# Patient Record
Sex: Female | Born: 1970 | Race: Black or African American | Hispanic: No | Marital: Single | Smoking: Former smoker
Health system: Southern US, Community
[De-identification: ages and names within clinical notes are randomized; demographics above are authoritative.]

## PROBLEM LIST (undated history)

## (undated) DIAGNOSIS — R011 Cardiac murmur, unspecified: Secondary | ICD-10-CM

## (undated) DIAGNOSIS — D649 Anemia, unspecified: Secondary | ICD-10-CM

## (undated) DIAGNOSIS — IMO0001 Reserved for inherently not codable concepts without codable children: Secondary | ICD-10-CM

## (undated) DIAGNOSIS — K649 Unspecified hemorrhoids: Secondary | ICD-10-CM

## (undated) DIAGNOSIS — K5900 Constipation, unspecified: Secondary | ICD-10-CM

## (undated) DIAGNOSIS — F32A Depression, unspecified: Secondary | ICD-10-CM

## (undated) DIAGNOSIS — N189 Chronic kidney disease, unspecified: Secondary | ICD-10-CM

## (undated) DIAGNOSIS — N281 Cyst of kidney, acquired: Secondary | ICD-10-CM

## (undated) DIAGNOSIS — F329 Major depressive disorder, single episode, unspecified: Secondary | ICD-10-CM

## (undated) DIAGNOSIS — N179 Acute kidney failure, unspecified: Secondary | ICD-10-CM

## (undated) DIAGNOSIS — K2970 Gastritis, unspecified, without bleeding: Secondary | ICD-10-CM

## (undated) DIAGNOSIS — I10 Essential (primary) hypertension: Secondary | ICD-10-CM

## (undated) DIAGNOSIS — E119 Type 2 diabetes mellitus without complications: Secondary | ICD-10-CM

## (undated) DIAGNOSIS — K219 Gastro-esophageal reflux disease without esophagitis: Secondary | ICD-10-CM

## (undated) HISTORY — PX: ABSCESS DRAINAGE: SHX1119

## (undated) HISTORY — DX: Gastritis, unspecified, without bleeding: K29.70

## (undated) HISTORY — PX: MULTIPLE TOOTH EXTRACTIONS: SHX2053

## (undated) HISTORY — DX: Acute kidney failure, unspecified: N17.9

## (undated) HISTORY — DX: Chronic kidney disease, unspecified: N18.9

## (undated) HISTORY — DX: Cyst of kidney, acquired: N28.1

## (undated) HISTORY — DX: Anemia, unspecified: D64.9

---
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
## 2014-09-08 ENCOUNTER — Inpatient Hospital Stay (HOSPITAL_COMMUNITY): Payer: Medicaid - Out of State

## 2014-09-08 ENCOUNTER — Encounter (HOSPITAL_COMMUNITY): Payer: Self-pay | Admitting: *Deleted

## 2014-09-08 ENCOUNTER — Inpatient Hospital Stay (HOSPITAL_COMMUNITY)
Admission: EM | Admit: 2014-09-08 | Discharge: 2014-09-10 | DRG: 392 | Disposition: A | Discharge status: Home / Self Care | Payer: Medicaid - Out of State | Attending: Internal Medicine | Admitting: Internal Medicine

## 2014-09-08 DIAGNOSIS — I150 Renovascular hypertension: Secondary | ICD-10-CM | POA: Diagnosis present

## 2014-09-08 DIAGNOSIS — N8320 Unspecified ovarian cysts: Secondary | ICD-10-CM | POA: Diagnosis present

## 2014-09-08 DIAGNOSIS — N179 Acute kidney failure, unspecified: Secondary | ICD-10-CM | POA: Diagnosis present

## 2014-09-08 DIAGNOSIS — N19 Unspecified kidney failure: Secondary | ICD-10-CM

## 2014-09-08 DIAGNOSIS — F17210 Nicotine dependence, cigarettes, uncomplicated: Secondary | ICD-10-CM | POA: Diagnosis present

## 2014-09-08 DIAGNOSIS — N189 Chronic kidney disease, unspecified: Secondary | ICD-10-CM | POA: Diagnosis present

## 2014-09-08 DIAGNOSIS — E1143 Type 2 diabetes mellitus with diabetic autonomic (poly)neuropathy: Secondary | ICD-10-CM | POA: Diagnosis present

## 2014-09-08 DIAGNOSIS — I129 Hypertensive chronic kidney disease with stage 1 through stage 4 chronic kidney disease, or unspecified chronic kidney disease: Secondary | ICD-10-CM | POA: Diagnosis present

## 2014-09-08 DIAGNOSIS — E11649 Type 2 diabetes mellitus with hypoglycemia without coma: Secondary | ICD-10-CM | POA: Diagnosis present

## 2014-09-08 DIAGNOSIS — R1110 Vomiting, unspecified: Secondary | ICD-10-CM

## 2014-09-08 DIAGNOSIS — D631 Anemia in chronic kidney disease: Secondary | ICD-10-CM | POA: Diagnosis present

## 2014-09-08 DIAGNOSIS — E1122 Type 2 diabetes mellitus with diabetic chronic kidney disease: Secondary | ICD-10-CM | POA: Diagnosis present

## 2014-09-08 DIAGNOSIS — R112 Nausea with vomiting, unspecified: Secondary | ICD-10-CM | POA: Diagnosis present

## 2014-09-08 DIAGNOSIS — K2970 Gastritis, unspecified, without bleeding: Principal | ICD-10-CM | POA: Diagnosis present

## 2014-09-08 DIAGNOSIS — K3184 Gastroparesis: Secondary | ICD-10-CM | POA: Diagnosis present

## 2014-09-08 DIAGNOSIS — R509 Fever, unspecified: Secondary | ICD-10-CM | POA: Diagnosis present

## 2014-09-08 DIAGNOSIS — N185 Chronic kidney disease, stage 5: Secondary | ICD-10-CM | POA: Diagnosis present

## 2014-09-08 DIAGNOSIS — Z794 Long term (current) use of insulin: Secondary | ICD-10-CM | POA: Diagnosis not present

## 2014-09-08 DIAGNOSIS — Z79899 Other long term (current) drug therapy: Secondary | ICD-10-CM | POA: Diagnosis not present

## 2014-09-08 DIAGNOSIS — I10 Essential (primary) hypertension: Secondary | ICD-10-CM

## 2014-09-08 DIAGNOSIS — D649 Anemia, unspecified: Secondary | ICD-10-CM | POA: Diagnosis present

## 2014-09-08 DIAGNOSIS — N83202 Unspecified ovarian cyst, left side: Secondary | ICD-10-CM

## 2014-09-08 HISTORY — DX: Type 2 diabetes mellitus without complications: E11.9

## 2014-09-08 HISTORY — DX: Essential (primary) hypertension: I10

## 2014-09-08 LAB — COMPREHENSIVE METABOLIC PANEL
ALT: 11 U/L — AB (ref 14–54)
AST: 16 U/L (ref 15–41)
Albumin: 4.1 g/dL (ref 3.5–5.0)
Alkaline Phosphatase: 86 U/L (ref 38–126)
Anion gap: 13 (ref 5–15)
BUN: 49 mg/dL — ABNORMAL HIGH (ref 6–20)
CHLORIDE: 109 mmol/L (ref 101–111)
CO2: 22 mmol/L (ref 22–32)
CREATININE: 4.51 mg/dL — AB (ref 0.44–1.00)
Calcium: 9.4 mg/dL (ref 8.9–10.3)
GFR, EST AFRICAN AMERICAN: 13 mL/min — AB (ref >60)
GFR, EST NON AFRICAN AMERICAN: 11 mL/min — AB (ref >60)
Glucose, Bld: 150 mg/dL — ABNORMAL HIGH (ref 65–99)
POTASSIUM: 3.9 mmol/L (ref 3.5–5.1)
Sodium: 144 mmol/L (ref 135–145)
TOTAL PROTEIN: 7.9 g/dL (ref 6.5–8.1)
Total Bilirubin: 0.8 mg/dL (ref 0.3–1.2)

## 2014-09-08 LAB — CBC
HCT: 30 % — ABNORMAL LOW (ref 36.0–46.0)
Hemoglobin: 9.3 g/dL — ABNORMAL LOW (ref 12.0–15.0)
MCH: 26.9 pg (ref 26.0–34.0)
MCHC: 31 g/dL (ref 30.0–36.0)
MCV: 86.7 fL (ref 78.0–100.0)
PLATELETS: 227 10*3/uL (ref 150–400)
RBC: 3.46 MIL/uL — AB (ref 3.87–5.11)
RDW: 15 % (ref 11.5–15.5)
WBC: 7.9 10*3/uL (ref 4.0–10.5)

## 2014-09-08 LAB — URINE MICROSCOPIC-ADD ON

## 2014-09-08 LAB — IRON AND TIBC
Iron: 64 ug/dL (ref 28–170)
Saturation Ratios: 17 % (ref 10.4–31.8)
TIBC: 368 ug/dL (ref 250–450)
UIBC: 304 ug/dL

## 2014-09-08 LAB — URINALYSIS, ROUTINE W REFLEX MICROSCOPIC
Bilirubin Urine: NEGATIVE
Glucose, UA: 250 mg/dL — AB
Hgb urine dipstick: NEGATIVE
KETONES UR: NEGATIVE mg/dL
LEUKOCYTES UA: NEGATIVE
NITRITE: NEGATIVE
PH: 6 (ref 5.0–8.0)
Specific Gravity, Urine: 1.013 (ref 1.005–1.030)
UROBILINOGEN UA: 0.2 mg/dL (ref 0.0–1.0)

## 2014-09-08 LAB — VITAMIN B12: Vitamin B-12: 434 pg/mL (ref 180–914)

## 2014-09-08 LAB — LIPASE, BLOOD: LIPASE: 13 U/L — AB (ref 22–51)

## 2014-09-08 LAB — GLUCOSE, CAPILLARY
Glucose-Capillary: 194 mg/dL — ABNORMAL HIGH (ref 65–99)
Glucose-Capillary: 128 mg/dL — ABNORMAL HIGH (ref 65–99)

## 2014-09-08 LAB — RETICULOCYTES
RBC.: 3.35 MIL/uL — AB (ref 3.87–5.11)
RETIC COUNT ABSOLUTE: 33.5 10*3/uL (ref 19.0–186.0)
RETIC CT PCT: 1 % (ref 0.4–3.1)

## 2014-09-08 LAB — FERRITIN: FERRITIN: 12 ng/mL (ref 11–307)

## 2014-09-08 LAB — CBG MONITORING, ED: Glucose-Capillary: 207 mg/dL — ABNORMAL HIGH (ref 65–99)

## 2014-09-08 LAB — POC URINE PREG, ED: Preg Test, Ur: NEGATIVE

## 2014-09-08 LAB — FOLATE: Folate: 13.5 ng/mL (ref >5.9)

## 2014-09-08 MED ORDER — SODIUM CHLORIDE 0.9 % IV SOLN
INTRAVENOUS | Status: DC
  Administered 2014-09-08 – 2014-09-10 (×3): via INTRAVENOUS

## 2014-09-08 MED ORDER — INSULIN GLARGINE 100 UNIT/ML ~~LOC~~ SOLN
5.0000 [IU] | Freq: Every day | SUBCUTANEOUS | Status: DC
  Administered 2014-09-08 – 2014-09-09 (×2): 5 [IU] via SUBCUTANEOUS
  Filled 2014-09-08 (×2): qty 0.05

## 2014-09-08 MED ORDER — HYDRALAZINE HCL 20 MG/ML IJ SOLN
10.0000 mg | INTRAMUSCULAR | Status: DC | PRN
  Administered 2014-09-09: 10 mg via INTRAVENOUS
  Filled 2014-09-08: qty 1

## 2014-09-08 MED ORDER — SODIUM CHLORIDE 0.9 % IV BOLUS (SEPSIS)
1000.0000 mL | Freq: Once | INTRAVENOUS | Status: AC
  Administered 2014-09-08: 1000 mL via INTRAVENOUS

## 2014-09-08 MED ORDER — ONDANSETRON HCL 4 MG PO TABS: 4.0000 mg | ORAL_TABLET | Freq: Four times a day (QID) | ORAL | Status: DC | PRN

## 2014-09-08 MED ORDER — PROMETHAZINE HCL 25 MG PO TABS: 12.5000 mg | ORAL_TABLET | Freq: Four times a day (QID) | ORAL | Status: DC | PRN

## 2014-09-08 MED ORDER — ONDANSETRON HCL 4 MG/2ML IJ SOLN
4.0000 mg | Freq: Once | INTRAMUSCULAR | Status: AC
  Administered 2014-09-08: 4 mg via INTRAVENOUS
  Filled 2014-09-08: qty 2

## 2014-09-08 MED ORDER — HEPARIN SODIUM (PORCINE) 5000 UNIT/ML IJ SOLN
5000.0000 [IU] | Freq: Three times a day (TID) | INTRAMUSCULAR | Status: DC
  Administered 2014-09-08 (×2): 5000 [IU] via SUBCUTANEOUS
  Filled 2014-09-08 (×9): qty 1

## 2014-09-08 MED ORDER — PROMETHAZINE HCL 25 MG/ML IJ SOLN
12.5000 mg | Freq: Once | INTRAMUSCULAR | Status: AC
  Administered 2014-09-08: 12.5 mg via INTRAVENOUS
  Filled 2014-09-08: qty 1

## 2014-09-08 MED ORDER — INSULIN ASPART 100 UNIT/ML ~~LOC~~ SOLN
0.0000 [IU] | Freq: Three times a day (TID) | SUBCUTANEOUS | Status: DC
  Administered 2014-09-08: 2 [IU] via SUBCUTANEOUS
  Administered 2014-09-09: 1 [IU] via SUBCUTANEOUS
  Administered 2014-09-09: 3 [IU] via SUBCUTANEOUS
  Administered 2014-09-10: 7 [IU] via SUBCUTANEOUS

## 2014-09-08 MED ORDER — MORPHINE SULFATE (PF) 4 MG/ML IV SOLN: 4.0000 mg | Freq: Once | INTRAVENOUS | Status: DC

## 2014-09-08 MED ORDER — PROMETHAZINE HCL 25 MG RE SUPP
25.0000 mg | Freq: Four times a day (QID) | RECTAL | Status: DC | PRN
  Filled 2014-09-08: qty 1

## 2014-09-08 MED ORDER — HYDRALAZINE HCL 50 MG PO TABS
100.0000 mg | ORAL_TABLET | Freq: Two times a day (BID) | ORAL | Status: DC
Start: 2014-09-08 — End: 2014-09-10
  Administered 2014-09-09 – 2014-09-10 (×3): 100 mg via ORAL
  Filled 2014-09-08 (×5): qty 2

## 2014-09-08 MED ORDER — ACETAMINOPHEN 650 MG RE SUPP
650.0000 mg | RECTAL | Status: DC | PRN
  Administered 2014-09-08: 650 mg via RECTAL
  Filled 2014-09-08: qty 1

## 2014-09-08 MED ORDER — METOCLOPRAMIDE HCL 5 MG/ML IJ SOLN
5.0000 mg | Freq: Three times a day (TID) | INTRAMUSCULAR | Status: DC
  Administered 2014-09-08 (×2): 5 mg via INTRAVENOUS
  Filled 2014-09-08 (×6): qty 1
  Filled 2014-09-08 (×3): qty 2

## 2014-09-08 MED ORDER — PROMETHAZINE HCL 25 MG/ML IJ SOLN
6.2500 mg | Freq: Four times a day (QID) | INTRAMUSCULAR | Status: DC | PRN
  Administered 2014-09-08: 6.25 mg via INTRAVENOUS
  Filled 2014-09-08 (×2): qty 1

## 2014-09-08 MED ORDER — DILTIAZEM HCL ER 240 MG PO CP24
240.0000 mg | ORAL_CAPSULE | Freq: Every day | ORAL | Status: DC
  Administered 2014-09-09 – 2014-09-10 (×2): 240 mg via ORAL
  Filled 2014-09-08 (×3): qty 1

## 2014-09-08 MED ORDER — ONDANSETRON HCL 4 MG/2ML IJ SOLN
4.0000 mg | Freq: Once | INTRAMUSCULAR | Status: AC | PRN
  Administered 2014-09-08: 4 mg via INTRAVENOUS
  Filled 2014-09-08: qty 2

## 2014-09-08 MED ORDER — ONDANSETRON HCL 4 MG/2ML IJ SOLN
4.0000 mg | Freq: Four times a day (QID) | INTRAMUSCULAR | Status: DC | PRN
  Administered 2014-09-08: 4 mg via INTRAVENOUS
  Filled 2014-09-08: qty 2

## 2014-09-08 MED ORDER — CLONIDINE HCL 0.3 MG PO TABS
0.3000 mg | ORAL_TABLET | Freq: Two times a day (BID) | ORAL | Status: DC
  Administered 2014-09-09 – 2014-09-10 (×3): 0.3 mg via ORAL
  Filled 2014-09-08 (×5): qty 1

## 2014-09-08 MED ORDER — VITAMIN D (ERGOCALCIFEROL) 1.25 MG (50000 UNIT) PO CAPS
50000.0000 [IU] | ORAL_CAPSULE | ORAL | Status: DC
  Administered 2014-09-08: 50000 [IU] via ORAL
  Filled 2014-09-08: qty 1

## 2014-09-08 MED ORDER — SODIUM CHLORIDE 0.9 % IV SOLN: INTRAVENOUS | Status: DC

## 2014-09-08 NOTE — ED Notes (Signed)
Bed: ZO10 Expected date:  Expected time:  Means of arrival:  Comments: EMS- 44yo F, emesis x 10 years

## 2014-09-08 NOTE — ED Notes (Addendum)
To bedside patient continues to gag and spit up. Father requesting something different for nausea for patient. Patient nonverbal. Offers response to questions only after asking multiple times.  Patient has pulled IV out. Bleeding controlled, dressing applied.  Pt remains unable to provide UA. Would not like me to restart her IV at this time.   Will continue to monitor.

## 2014-09-08 NOTE — ED Notes (Signed)
Pt still unable to void at this time 

## 2014-09-08 NOTE — ED Notes (Signed)
Pt in restroom and has been instructed to obtain urine specimen

## 2014-09-08 NOTE — ED Notes (Signed)
Pt had an episode of vomiting will f/u with EDP

## 2014-09-08 NOTE — Progress Notes (Addendum)
CM spoke with pt who confirms uninsured [employer] resident with no pcp.  CM discussed and provided written information for uninsured accepting pcps, discussed the importance of pcp vs EDP services for f/u care, www.needymeds.org, www.goodrx.com, discounted pharmacies and other [employer] such as [facility] , [facility], affordable care act, financial assistance, uninsured dental services, [place] med assist, DSS and  health department  Reviewed resources for [employer] uninsured accepting pcps like [facility], family medicine at [facility], [facility], [facility], local urgent care centers, [facility], [facility] family practice, general medical clinics, family services of the [place], [facility] urgent care plus others, medication resources, [facility] out patient pharmacies and housing Pt voiced understanding and appreciation of resources provided   Provided [facility] contact information Pt agreed to a referral Cm completed referral Pt to be contact by [facility] clinical [employer] given to female at bedside in a pt belonging bag

## 2014-09-08 NOTE — H&P (Signed)
History and Physical    Tarini Carrier AVW:[id number] DOB: 1970/12/07 DOA: 09/08/2014  Referring physician: Dr. Adriana Simas PCP: No primary care provider on file.  Specialists: none  Chief Complaint: nausea  HPI: Crystal Tanner is a 44 y.o. female has a past medical history significant for chronic kidney disease, insulin-dependent diabetes mellitus, history of recurrent intractable nausea and vomiting, who recently relocated to [place] area from [place], presents to the emergency room with a chief complaint of severe nausea for the past 2-3 hours. She is not sure of why she has nausea, she gets 3 episodes per year which usually go away with IV medications. She is not very forthcoming when it comes to prior medical history. She denies any chest pain, denies any shortness of breath, she denies any abdominal pain, she denies any diarrhea or constipation. She has no fever or chills. She denies any dysuria or any problems urinating. In the emergency room, she was found to hypertensive in the 180s to 200s systolic, her nausea has been difficult to control, her blood work showed a BUN of 49 and a creatinine of 4.5 as well as mild anemia with a hemoglobin of 9.3. Patient endorses a history of renal disease and seen a nephrologist, however she denies prior discussions about hemodialysis (she was told that eventually she will need it). She doesn't know her baseline kidney function.  [facility] was asked to admit for intractable nausea and vomiting as well as renal failure of undetermined chronicity  Review of Systems: As per history of present illness, otherwise 10 point ROSnegative  Past Medical History  Diagnosis Date  . Diabetes mellitus without complication   . Hypertension    History reviewed. No pertinent past surgical history.   Social History:  reports that she has been smoking.  She does not have any smokeless tobacco history on file. She reports that she does not drink alcohol or use illicit drugs.  No  Known Allergies  Denies existing or history of medical problems in her family.  Prior to Admission medications   Medication Sig Start Date End Date Taking? Authorizing Provider  cloNIDine (CATAPRES) 0.3 MG tablet Take 0.3 mg by mouth 2 (two) times daily.   Yes Historical Provider, MD  diltiazem (DILACOR XR) 240 MG 24 hr capsule Take 240 mg by mouth daily.   Yes Historical Provider, MD  furosemide (LASIX) 40 MG tablet Take 40 mg by mouth daily as needed for fluid or edema.   Yes Historical Provider, MD  hydrALAZINE (APRESOLINE) 100 MG tablet Take 100 mg by mouth 2 (two) times daily.   Yes Historical Provider, MD  insulin aspart (NOVOLOG FLEXPEN) 100 UNIT/ML FlexPen Inject 1-10 Units into the skin 3 (three) times daily with meals.   Yes Historical Provider, MD  insulin glargine (LANTUS) 100 UNIT/ML injection Inject 5 Units into the skin at bedtime.   Yes Historical Provider, MD  Vitamin D, Ergocalciferol, (DRISDOL) 50000 UNITS CAPS capsule Take 50,000 Units by mouth every 7 (seven) days.   Yes Historical Provider, MD   Physical Exam: Filed Vitals:   09/08/14 0746 09/08/14 1001 09/08/14 1049 09/08/14 1148  BP: 207/85 174/55 188/79 186/65  Pulse:  86 93 90  Resp:  SpO2:  98% 94% 98%     GENERAL: in distress, dry heaving   HEENT: head NCAT, no scleral icterus. Pupils round and reactive. Mucous membranes are moist. Posterior pharynx clear of any exudate or lesions.  NECK: Supple.  LUNGS: Clear to  auscultation. No wheezing or crackles  HEART: Regular rate and rhythm without murmur. 2+ pulses, no JVD, trace peripheral edema  ABDOMEN: Soft, nontender, and nondistended. Positive bowel sounds.   EXTREMITIES: Without any cyanosis, clubbing, rash  NEUROLOGIC: Alert and oriented x3. Non focal  SKIN: No ulceration or induration present.   Labs on Admission:  Basic Metabolic Panel:  Recent Labs Lab 09/08/14 0810  NA 144  K 3.9  CL 109  CO2 22  GLUCOSE 150*  BUN 49*    CREATININE 4.51*  CALCIUM 9.4   Liver Function Tests:  Recent Labs Lab 09/08/14 0810  AST 16  ALT 11*  ALKPHOS 86  BILITOT 0.8  PROT 7.9  ALBUMIN 4.1    Recent Labs Lab 09/08/14 0810  LIPASE 13*   CBC:  Recent Labs Lab 09/08/14 0810  WBC 7.9  HGB 9.3*  HCT 30.0*  MCV 86.7  PLT 227   EKG: pending  Assessment/Plan Active Problems:   Gastritis   Intractable nausea and vomiting   CKD (chronic kidney disease)   AKI (acute kidney injury)   Diabetes mellitus   HTN (hypertension)   Anemia   Intractable nausea and vomiting  - Based on history, as well as the presence of diabetes, suspect that she may have underlying gastroparesis  - When asked about what worse in the past, she mentions Reglan  - We'll start Zofran, Phenergan for refractory nausea as well as scheduled Reglan  - Nothing by mouth except medications, advance diet as tolerated  - Provide IV fluids meanwhile  - missed her period in August, obtain pregnancy testing  Acute on chronic renal failure - Likely due to underlying diabetes, I'm not sure about her baseline renal function - Provide IV fluids, monitor creatinine in the morning - Obtain renal ultrasound - If she is really stage 4-5, we will need to see nephrology soon [facility] consult care management for help with establishing a PCP in the area - consent form obtained to release of information, will fax as soon as she gets to the floor.  DM - We'll obtain a hemoglobin A1c, she is well controlled per patient - Resume her home Lantus as well as sliding scale  Hypertension - Suspect this has been difficult to control as an outpatient, she is on clonidine, diltiazem, Lasix and hydralazine. - Resume her home regimen, IV hydralazine as needed  Anemia - Likely in the setting of CKD, obtain anemia panel   Diet: Nothing by mouth except medications  Fluids: normal saline  DVT Prophylaxis: heparin subcutaneous  Code Status: full code  Family  Communication: d/w father bedside Disposition Plan: admit to [facility]. Elvera Lennox, MD [facility] Pager [phone]  If 7PM-7AM, please contact night-coverage [email or web address] Password [email or web address] 09/08/2014, 1:15 PM

## 2014-09-08 NOTE — Progress Notes (Signed)
Temp of 101.3 paged to MD on call.  CXR was ordered, but pt refused stating "I've been here 10 times for the same thing and it has nothing to do with my chest."  Md notified of refusal. Pt did take tylenol suppository and allowed blood cx x 2.  She is resting now. Will continue to monitor pt.

## 2014-09-08 NOTE — ED Notes (Signed)
Unsuccessful Iv attempt x 2, second RN to attempt 

## 2014-09-08 NOTE — ED Notes (Signed)
Still unable to provide urine specimen.

## 2014-09-08 NOTE — ED Notes (Signed)
Pt stated that she is unable to urinate at this time.

## 2014-09-08 NOTE — ED Notes (Signed)
2nd call placed for report

## 2014-09-08 NOTE — ED Provider Notes (Signed)
CSN: [id number]     Arrival date & time 09/08/14  1191 History   First MD Initiated Contact with Patient 09/08/14 [phone]     Chief Complaint  Patient presents with  . Emesis     (Consider location/radiation/quality/duration/timing/severity/associated sxs/prior Treatment) HPI....... patient complains of intractable vomiting for several hours prior to admission.   This has happened multiple times in the past. She has diabetes and hypertension. She recently moved here from [facility]. She does not have a primary care relationship. Vague history of renal disease, but no specifics from patient or her father. No fever, sweats, chills. Decreased urinary output   Past Medical History  Diagnosis Date  . Diabetes mellitus without complication   . Hypertension    History reviewed. No pertinent past surgical history. No family history on file. Social History  Substance Use Topics  . Smoking status: Current Every Day Smoker  . Smokeless tobacco: None  . Alcohol Use: No     Comment: less than a ppd, "not much"   OB History    No data available     Review of Systems  All other systems reviewed and are negative.     Allergies  Review of patient's allergies indicates no known allergies.  Home Medications   Prior to Admission medications   Medication Sig Start Date End Date Taking? Authorizing Provider  cloNIDine (CATAPRES) 0.3 MG tablet Take 0.3 mg by mouth 2 (two) times daily.   Yes Historical Provider, MD  diltiazem (DILACOR XR) 240 MG 24 hr capsule Take 240 mg by mouth daily.   Yes Historical Provider, MD  furosemide (LASIX) 40 MG tablet Take 40 mg by mouth daily as needed for fluid or edema.   Yes Historical Provider, MD  hydrALAZINE (APRESOLINE) 100 MG tablet Take 100 mg by mouth 2 (two) times daily.   Yes Historical Provider, MD  insulin aspart (NOVOLOG FLEXPEN) 100 UNIT/ML FlexPen Inject 1-10 Units into the skin 3 (three) times daily with meals.   Yes Historical Provider, MD   insulin glargine (LANTUS) 100 UNIT/ML injection Inject 5 Units into the skin at bedtime.   Yes Historical Provider, MD  Vitamin D, Ergocalciferol, (DRISDOL) 50000 UNITS CAPS capsule Take 50,000 Units by mouth every 7 (seven) days.   Yes Historical Provider, MD   BP 186/65 mmHg  Pulse 90  Temp(Src)   Resp 18  SpO2 98% Physical Exam  Constitutional: She is oriented to person, place, and time. She appears well-developed and well-nourished.  HENT:  Head: Normocephalic and atraumatic.  Eyes: Conjunctivae and EOM are normal. Pupils are equal, round, and reactive to light.  Neck: Normal range of motion. Neck supple.  Cardiovascular: Normal rate and regular rhythm.   Pulmonary/Chest: Effort normal and breath sounds normal.  Abdominal: Soft. Bowel sounds are normal.  Minimal epigastric tenderness  Musculoskeletal: Normal range of motion.  Neurological: She is alert and oriented to person, place, and time.  Skin: Skin is warm and dry.  Psychiatric: She has a normal mood and affect. Her behavior is normal.  Nursing note and vitals reviewed.   ED Course  Procedures (including critical care time) Labs Review Labs Reviewed  LIPASE, BLOOD - Abnormal; Notable for the following:    Lipase 13 (*)    All other components within normal limits  COMPREHENSIVE METABOLIC PANEL - Abnormal; Notable for the following:    Glucose, Bld 150 (*)    BUN 49 (*)    Creatinine, Ser 4.51 (*)  ALT 11 (*)    GFR calc non Af Amer 11 (*)    GFR calc Af Amer 13 (*)    All other components within normal limits  CBC - Abnormal; Notable for the following:    RBC 3.46 (*)    Hemoglobin 9.3 (*)    HCT 30.0 (*)    All other components within normal limits  URINALYSIS, ROUTINE W REFLEX MICROSCOPIC (NOT AT [facility])    Imaging Review No results found. I have personally reviewed and evaluated these images and lab results as part of my medical decision-making.   EKG Interpretation None      MDM   Final  diagnoses:  Gastritis  Renal failure    I suspect patient has gastroparesis secondary to diabetes.  IV hydration, IV Zofran. Creatinine noted to be elevated. Will need nephrology consult. Admit to general medicine    Donnetta Hutching, MD 09/08/14 [phone]

## 2014-09-08 NOTE — ED Notes (Signed)
Pt reminded of urine specimen. Pt nodded in acknowledgement.

## 2014-09-08 NOTE — ED Notes (Signed)
Vomiting x 10 years. This episode has been going on x 3 hours, throwing up mostly "phlegm" per EMS.   Hx htn, DM, ?cyst on kidney  190/82 HR 70 RR 18 CBG 127 97% on RA

## 2014-09-08 NOTE — ED Notes (Signed)
Pt in restroom attempting to provide urine specimen.

## 2014-09-08 NOTE — ED Notes (Signed)
Call placed for report, RN not available at this time. Will f/u

## 2014-09-08 NOTE — ED Notes (Signed)
Pt reminded of need for UA pt just stares at me when i ask her about it. After asking several times she states she is unable to void at this time.

## 2014-09-09 ENCOUNTER — Encounter (HOSPITAL_COMMUNITY): Payer: Self-pay | Admitting: Radiology

## 2014-09-09 ENCOUNTER — Inpatient Hospital Stay (HOSPITAL_COMMUNITY): Payer: Medicaid - Out of State

## 2014-09-09 DIAGNOSIS — R509 Fever, unspecified: Secondary | ICD-10-CM

## 2014-09-09 DIAGNOSIS — N8320 Unspecified ovarian cysts: Secondary | ICD-10-CM

## 2014-09-09 LAB — CBC
HEMATOCRIT: 31.6 % — AB (ref 36.0–46.0)
HEMOGLOBIN: 9.7 g/dL — AB (ref 12.0–15.0)
MCH: 26.8 pg (ref 26.0–34.0)
MCHC: 30.7 g/dL (ref 30.0–36.0)
MCV: 87.3 fL (ref 78.0–100.0)
Platelets: 260 10*3/uL (ref 150–400)
RBC: 3.62 MIL/uL — ABNORMAL LOW (ref 3.87–5.11)
RDW: 14.9 % (ref 11.5–15.5)
WBC: 8.4 10*3/uL (ref 4.0–10.5)

## 2014-09-09 LAB — BASIC METABOLIC PANEL
Anion gap: 13 (ref 5–15)
BUN: 46 mg/dL — AB (ref 6–20)
CHLORIDE: 113 mmol/L — AB (ref 101–111)
CO2: 22 mmol/L (ref 22–32)
Calcium: 9.4 mg/dL (ref 8.9–10.3)
Creatinine, Ser: 4.35 mg/dL — ABNORMAL HIGH (ref 0.44–1.00)
GFR calc Af Amer: 13 mL/min — ABNORMAL LOW (ref >60)
GFR calc non Af Amer: 11 mL/min — ABNORMAL LOW (ref >60)
GLUCOSE: 45 mg/dL — AB (ref 65–99)
POTASSIUM: 3.6 mmol/L (ref 3.5–5.1)
Sodium: 148 mmol/L — ABNORMAL HIGH (ref 135–145)

## 2014-09-09 LAB — HEMOGLOBIN A1C
Hgb A1c MFr Bld: 7.3 % — ABNORMAL HIGH (ref 4.8–5.6)
Mean Plasma Glucose: 163 mg/dL

## 2014-09-09 LAB — GLUCOSE, CAPILLARY
GLUCOSE-CAPILLARY: 62 mg/dL — AB (ref 65–99)
GLUCOSE-CAPILLARY: 280 mg/dL — AB (ref 65–99)
Glucose-Capillary: 78 mg/dL (ref 65–99)
Glucose-Capillary: 129 mg/dL — ABNORMAL HIGH (ref 65–99)
Glucose-Capillary: 223 mg/dL — ABNORMAL HIGH (ref 65–99)

## 2014-09-09 MED ORDER — LIP MEDEX EX OINT
TOPICAL_OINTMENT | CUTANEOUS | Status: AC
  Administered 2014-09-09: 13:00:00
  Filled 2014-09-09: qty 7

## 2014-09-09 MED ORDER — IOHEXOL 300 MG/ML  SOLN
25.0000 mL | INTRAMUSCULAR | Status: AC
  Administered 2014-09-09 (×2): 25 mL via ORAL

## 2014-09-09 NOTE — Progress Notes (Signed)
Hypoglycemic Event  CBG: 62  Treatment: 15 GM carbohydrate snack  Symptoms: None  Follow-up CBG: Time:0753 CBG Result:78  Possible Reasons for Event: Inadequate meal intake  Comments/MD notified:Gherghe: Clear Liquid diet ordered    Sharice Harriss Dawn  Remember to initiate Hypoglycemia Order Set & complete

## 2014-09-09 NOTE — Progress Notes (Addendum)
Initial Nutrition Assessment  DOCUMENTATION CODES:   Not applicable  INTERVENTION:  - Continue diet order: carb modified diet. - RD will continue to monitor patient needs.    NUTRITION DIAGNOSIS:   Inadequate oral intake related to vomiting, nausea as evidenced by per patient/family report.   GOAL:   Patient will meet greater than or equal to 90% of their needs   MONITOR:   PO intake, Weight trends, Labs, Skin  REASON FOR ASSESSMENT:   Malnutrition Screening Tool    ASSESSMENT:   44 yr old female, presenting with gastritis and emesis. Previous history of DM, HTN, and Renal Disease.   - Patient assessed for MST score. Patient alert in room, guest was present during assessment. Patient reported heigh and weight, and BMI was calculated, 25.85 (overweight).   - Normal diet for patient to include lighter meals throughout day, such as: cereal, sandwiches, and coffee. Patient described eating a main meal in the evening.  Patient states that she only drinks coffee periodically throughout the day, no other beverages reported.  No supplements reported.   - No weight or height in medical record, patient reported values. Patient discussed previous weight loss of 100 #, 17 years ago. Weight has been stable since weight loss.   - Patient has a history of gastritis-related episodes, with n=10 occurences previously. During episodes, she has nausea/vomitting for 24 hours, then symptoms subside. Patient states that during these episodes, there is usually no pain associated.   - Medications reviewed: Reglan order in place.   - Labs reviewed: high Na (148), high BUN (46), high Cr (4.35), low EGFR (13), CBG (62-207).   Diet Order:  Diet Carb Modified Fluid consistency:: Thin; Room service appropriate?: Yes  Skin:  Reviewed, no issues  Last BM:  PTA  Height:   Ht Readings from Last 1 Encounters:  09/09/14 5' 8"  (1.727 m)    Weight:   Wt Readings from Last 1 Encounters:   09/09/14 170 lb (77.111 kg)    Ideal Body Weight:  63.6 kg  BMI:  Body mass index is 25.85 kg/(m^2).  Estimated Nutritional Needs:   Kcal:  1600-1800 kcal  Protein:  65-75 g  Fluid:  >/= 1.7 L/day  EDUCATION NEEDS:   No education needs identified at this time  Kayleen Memos, Dietetic Intern

## 2014-09-09 NOTE — Progress Notes (Signed)
MD paged to view abnormal Abd CT scan results.

## 2014-09-09 NOTE — Progress Notes (Signed)
PROGRESS NOTE  Crystal Tanner ZOX:[id number] DOB: [birth year] DOA: 09/08/2014 PCP: No primary care provider on file.  HPI: 44 y.o. female has a past medical history significant for chronic kidney disease, insulin-dependent diabetes mellitus, history of recurrent intractable nausea and vomiting, who recently relocated to [place] area from [place], presents to the emergency room with a chief complaint of severe nausea for the past 2-3 hours  Subjective / 24 H Interval events - improved this morning, nausea improved, asking to eat  Assessment/Plan: Active Problems:   Gastritis   Intractable nausea and vomiting   CKD (chronic kidney disease)   AKI (acute kidney injury)   Diabetes mellitus   HTN (hypertension)   Anemia  Intractable nausea and vomiting  - improving with conservative measures, stable today   Acute on chronic renal failure - Likely due to underlying diabetes, I'm not sure about her baseline renal function but suspect CKD stage IV-V - renal US without acute findings but medical renal disease  Fever - febrile last night, given abdominal complaints obtained a CT scan which showed Fluid attenuation structures in the LEFT adnexa, largest 3.6 x 3.0 cm, differential diagnosis including non-opacified small bowel loops, ovarian cysts, and hydrosalpinx/tubo-ovarian abscess.  - obtain US to better characterize  DM - AiC 7.3 - Resumed her home Lantus as well as sliding scale - hypoglycemic episode this morning, decrease Lantus  Hypertension - Suspect this has been difficult to control as an outpatient, she is on clonidine, diltiazem, Lasix and hydralazine. - Resume her home regimen, IV hydralazine as needed - blood pressure with fair control, no changes   Anemia - stable, no iron deficiency noted   Diet: Diet Carb Modified Fluid consistency:: Thin; Room service appropriate?: Yes Fluids: none  DVT Prophylaxis: heparin  Code Status: Full Code Family Communication: d/w  father bedside  Disposition Plan: home when ready   Consultants:  None   Procedures:  None    Antibiotics  Anti-infectives    None       Studies  Ct Abdomen Pelvis Wo Contrast  09/09/2014   ADDENDUM REPORT: 09/09/2014 13:58  ADDENDUM: These results will be called to the ordering clinician or representative by the Radiologist Assistant, and communication documented in the PACS or zVision Dashboard.   Electronically Signed   By: Ulyses Southward M.D.   On: 09/09/2014 13:58   09/09/2014   CLINICAL DATA:  Chronic kidney disease, diabetes mellitus, hypertension, intractable nausea and vomiting, gastritis  EXAM: CT ABDOMEN AND PELVIS WITHOUT CONTRAST  TECHNIQUE: Multidetector CT imaging of the abdomen and pelvis was performed following the standard protocol without IV contrast. Sagittal and coronal MPR images reconstructed from axial data set. Oral contrast was administered.  COMPARISON:  None  FINDINGS: Minimal atelectasis or ground-glass infiltrate in RIGHT lower lobe.  Heart appears enlarged.  Low-attenuation of circulating blood question anemia.  Extensive atherosclerotic calcifications.  Cyst at inferior pole LEFT kidney 3.0 x 2.6 cm.  Within limits of a nonenhanced exam, liver, spleen, pancreas, kidneys, and adrenal glands otherwise grossly normal appearance.  Distended gallbladder with question dependent density/tiny gallstones.  Normal appearing bladder and ureters.  Distal small bowel loops and colon are opacified by GI contrast, grossly unremarkable.  Suboptimal assessment of proximal small bowel loops and remainder of colon.  Stomach decompressed, wall appearing thickened though this could be an artifact from underdistention.  Fluid collections in the LEFT adnexa, largest 3.6 x 3.0 cm image 68.  While these could potentially be related to unopacified  proximal small bowel loops, none of the remaining small bowel loops demonstrate fluid attenuation contents.  Ovarian cysts and  hydrosalpinx/tubo-ovarian abscess not excluded.  Unremarkable uterus and RIGHT adnexa.  No adenopathy, free air, or ascites.  Scattered infiltration of soft tissue planes suggesting hypoproteinemia.  No acute osseous findings.  IMPRESSION: Fluid attenuation structures in the LEFT adnexa, largest 3.6 x 3.0 cm, differential diagnosis including non-opacified small bowel loops, ovarian cysts, and hydrosalpinx/tubo-ovarian abscess.  Correlation with sonography recommended.  Extensive atherosclerotic disease.  Question gallstones.  Small LEFT renal cyst.  Suspect anemia.  Question gastric wall thickening versus artifact from underdistention ; recommend correlation with a cyst history and consider upper GI exam or upper endoscopy for further evaluation.  Electronically Signed: By: Ulyses Southward M.D. On: 09/09/2014 13:49   US Renal  09/08/2014   CLINICAL DATA:  Renal failure.  Hypertension and diabetes.  EXAM: RENAL / URINARY TRACT ULTRASOUND COMPLETE  COMPARISON:  None.  FINDINGS: Right Kidney:  Length: 11.9 cm. No mass or hydronephrosis. Cortical echogenicity is increased.  Left Kidney:  Length: 11.7 cm. No hydronephrosis. Cortical echogenicity is increased. A cyst off the lower pole measures 3.2 x 2.6 x 2.6 cm. There appears to be a small amount of debris within the cyst.  Bladder:  Appears normal for degree of bladder distention.  IMPRESSION: Increased cortical echogenicity in both kidneys consistent with medical renal disease.   Electronically Signed   By: Drusilla Kanner M.D.   On: 09/08/2014 17:28    Objective  Filed Vitals:   09/09/14 0950 09/09/14 1116 09/09/14 1400 09/09/14 1432  BP: 207/62 116/41 139/60   Pulse:  67 55   Temp: 97.2 F (36.2 C)  100.3 F (37.9 C)   TempSrc: Axillary  Oral   Resp:      Height:    5\' 8"  (1.727 m)  Weight:    77.111 kg (170 lb)  SpO2:   99%     Intake/Output Summary (Last 24 hours) at 09/09/14 1500 Last data filed at 09/09/14 0856  Gross per 24 hour  Intake  1531.25 ml  Output      0 ml  Net 1531.25 ml   Filed Weights   09/09/14 1432  Weight: 77.111 kg (170 lb)    Exam:  GENERAL: NAD  HEENT: head NCAT, no scleral icterus.   NECK: Supple. No carotid bruits. No lymphadenopathy or thyromegaly.  LUNGS: Clear to auscultation. No wheezing or crackles  HEART: Regular rate and rhythm without murmur. 2+ pulses, no JVD, no peripheral edema  ABDOMEN: Soft, nontender, and nondistended. Positive bowel sounds. No hepatosplenomegaly was noted.  EXTREMITIES: Without any cyanosis, clubbing, rash, lesions or edema.   Data Reviewed: Basic Metabolic Panel:  Recent Labs Lab 09/08/14 0810 09/09/14 0540  NA 144 148*  K 3.9 3.6  CL 109 113*  CO2 22 22  GLUCOSE 150* 45*  BUN 49* 46*  CREATININE 4.51* 4.35*  CALCIUM 9.4 9.4   Liver Function Tests:  Recent Labs Lab 09/08/14 0810  AST 16  ALT 11*  ALKPHOS 86  BILITOT 0.8  PROT 7.9  ALBUMIN 4.1    Recent Labs Lab 09/08/14 0810  LIPASE 13*   CBC:  Recent Labs Lab 09/08/14 0810 09/09/14 0540  WBC 7.9 8.4  HGB 9.3* 9.7*  HCT 30.0* 31.6*  MCV 86.7 87.3  PLT 227 260   CBG:  Recent Labs Lab 09/08/14 1810 09/08/14 2039 09/09/14 0727 09/09/14 0752 09/09/14 1217  GLUCAP 194* 128* 62*  78 129*    Recent Results (from the past 240 hour(s))  Culture, blood (routine x 2)     Status: None (Preliminary result)   Collection Time: 09/08/14  9:35 PM  Result Value Ref Range Status   Specimen Description BLOOD RIGHT ANTECUBITAL  Final   Special Requests BOTTLES DRAWN AEROBIC ONLY 5CC  Final   Culture   Final    NO GROWTH < 24 HOURS Performed at [facility]    Report Status PENDING  Incomplete  Culture, blood (routine x 2)     Status: None (Preliminary result)   Collection Time: 09/08/14  9:40 PM  Result Value Ref Range Status   Specimen Description BLOOD RIGHT HAND  Final   Special Requests BOTTLES DRAWN AEROBIC ONLY 5CC  Final   Culture   Final    NO GROWTH <  24 HOURS Performed at [facility]    Report Status PENDING  Incomplete     Scheduled Meds: . cloNIDine  0.3 mg Oral BID  . diltiazem  240 mg Oral Daily  . heparin  5,000 Units Subcutaneous 3 times per day  . hydrALAZINE  100 mg Oral BID  . insulin aspart  0-9 Units Subcutaneous TID WC  . insulin glargine  5 Units Subcutaneous QHS  . metoCLOPramide (REGLAN) injection  5 mg Intravenous 3 times per day  .  morphine injection  4 mg Intravenous Once  . Vitamin D (Ergocalciferol)  50,000 Units Oral Q7 days   Continuous Infusions: . sodium chloride 75 mL/hr at 09/08/14 1319     Pamella Pert, MD [facility] Pager [phone]. If 7 PM - 7 AM, please contact night-coverage at [email or web address], password [facility] 09/09/2014, 3:00 PM  LOS: 1 day

## 2014-09-10 DIAGNOSIS — N184 Chronic kidney disease, stage 4 (severe): Secondary | ICD-10-CM

## 2014-09-10 DIAGNOSIS — K2970 Gastritis, unspecified, without bleeding: Principal | ICD-10-CM

## 2014-09-10 LAB — GLUCOSE, CAPILLARY: Glucose-Capillary: 329 mg/dL — ABNORMAL HIGH (ref 65–99)

## 2014-09-10 MED ORDER — CLONIDINE HCL 0.3 MG PO TABS: 0.3000 mg | ORAL_TABLET | Freq: Two times a day (BID) | ORAL | Status: DC

## 2014-09-10 MED ORDER — HYDRALAZINE HCL 100 MG PO TABS: 100.0000 mg | ORAL_TABLET | Freq: Three times a day (TID) | ORAL | Status: DC

## 2014-09-10 MED ORDER — FUROSEMIDE 40 MG PO TABS: 40.0000 mg | ORAL_TABLET | Freq: Every day | ORAL | Status: DC | PRN

## 2014-09-10 MED ORDER — DILTIAZEM HCL ER 240 MG PO CP24: 240.0000 mg | ORAL_CAPSULE | Freq: Every day | ORAL | Status: DC

## 2014-09-10 NOTE — Discharge Instructions (Signed)
Follow with primary care provider on file. in 5-7 days  Please get a complete blood count and chemistry panel checked by your Primary MD at your next visit, and again as instructed by your Primary MD. Please get your medications reviewed and adjusted by your Primary MD.  Please request your Primary MD to go over all Hospital Tests and Procedure/Radiological results at the follow up, please get all Hospital records sent to your Prim MD by signing hospital release before you go home.  If you had Pneumonia of Lung problems at the Hospital: Please get a 2 view Chest X ray done in 6-8 weeks after hospital discharge or sooner if instructed by your Primary MD.  If you have Congestive Heart Failure: Please call your Cardiologist or Primary MD anytime you have any of the following symptoms:  1) 3 pound weight gain in 24 hours or 5 pounds in 1 week  2) shortness of breath, with or without a dry hacking cough  3) swelling in the hands, feet or stomach  4) if you have to sleep on extra pillows at night in order to breathe  Follow cardiac low salt diet and 1.5 lit/day fluid restriction.  If you have diabetes Accuchecks 4 times/day, Once in AM empty stomach and then before each meal. Log in all results and show them to your primary doctor at your next visit. If any glucose reading is under 80 or above 300 call your primary MD immediately.  If you have Seizure/Convulsions/Epilepsy: Please do not drive, operate heavy machinery, participate in activities at heights or participate in high speed sports until you have seen by Primary MD or a Neurologist and advised to do so again.  If you had Gastrointestinal Bleeding: Please ask your Primary MD to check a complete blood count within one week of discharge or at your next visit. Your endoscopic/colonoscopic biopsies that are pending at the time of discharge, will also need to followed by your Primary MD.  Get Medicines reviewed and adjusted. Please take all  your medications with you for your next visit with your Primary MD  Please request your Primary MD to go over all hospital tests and procedure/radiological results at the follow up, please ask your Primary MD to get all Hospital records sent to his/her office.  If you experience worsening of your admission symptoms, develop shortness of breath, life threatening emergency, suicidal or homicidal thoughts you must seek medical attention immediately by calling 911 or calling your MD immediately  if symptoms less severe.  You must read complete instructions/literature along with all the possible adverse reactions/side effects for all the Medicines you take and that have been prescribed to you. Take any new Medicines after you have completely understood and accpet all the possible adverse reactions/side effects.   Do not drive or operate heavy machinery when taking Pain medications.   Do not take more than prescribed Pain, Sleep and Anxiety Medications  Special Instructions: If you have smoked or chewed Tobacco  in the last 2 yrs please stop smoking, stop any regular Alcohol  and or any Recreational drug use.  Wear Seat belts while driving.  Please note You were cared for by a hospitalist during your hospital stay. If you have any questions about your discharge medications or the care you received while you were in the hospital after you are discharged, you can call the unit and asked to speak with the hospitalist on call if the hospitalist that took care of you is not  available. Once you are discharged, your primary care physician will handle any further medical issues. Please note that NO REFILLS for any discharge medications will be authorized once you are discharged, as it is imperative that you return to your primary care physician (or establish a relationship with a primary care physician if you do not have one) for your aftercare needs so that they can reassess your need for medications and monitor  your lab values.  You can reach the hospitalist office at phone [phone] or fax [phone]   If you do not have a primary care physician, you can call [phone] for a physician referral.  Activity: As tolerated with Full fall precautions use walker/cane & assistance as needed  Diet: renal  Disposition Home

## 2014-09-10 NOTE — Discharge Summary (Signed)
Physician Discharge Summary  [name] ZOX:[id number] DOB: 07-20-1970 DOA: 09/08/2014  PCP: No primary care provider on file.  Admit date: 09/08/2014 Discharge date: 09/10/2014  Time spent: > 35 minutes  Recommendations for Outpatient Follow-up:  1. Follow up with [facility] in 1 week 2. Follow up with [facility] in 1 week   Discharge Diagnoses:  Active Problems:   Gastritis   Intractable nausea and vomiting   CKD (chronic kidney disease)   AKI (acute kidney injury)   Diabetes mellitus   HTN (hypertension)   Anemia  Discharge Condition: stable  Diet recommendation: regular  Filed Weights   09/09/14 1432  Weight: 77.111 kg (170 lb)   History of present illness:  Crystal Tanner is a 44 y.o. female has a past medical history significant for chronic kidney disease, insulin-dependent diabetes mellitus, history of recurrent intractable nausea and vomiting, who recently relocated to [place] area from [place], presents to the emergency room with a chief complaint of severe nausea for the past 2-3 hours. She is not sure of why she has nausea, she gets 3 episodes per year which usually go away with IV medications. She is not very forthcoming when it comes to prior medical history. She denies any chest pain, denies any shortness of breath, she denies any abdominal pain, she denies any diarrhea or constipation. She has no fever or chills. She denies any dysuria or any problems urinating. In the emergency room, she was found to hypertensive in the 180s to 200s systolic, her nausea has been difficult to control, her blood work showed a BUN of 49 and a creatinine of 4.5 as well as mild anemia with a hemoglobin of 9.3. Patient endorses a history of renal disease and seen a nephrologist, however she denies prior discussions about hemodialysis (she was told that eventually she will need it). She doesn't know her baseline kidney function. [facility] was  asked to admit for intractable nausea and vomiting as well as renal failure of undetermined chronicity  Hospital Course:  Intractable nausea and vomiting - improving with conservative measures, patient's diet was advanced and she was able to tolerate a regular diet.  Acute on chronic renal failure - Likely due to underlying diabetes, I'm not sure about her baseline renal function but suspect CKD stage IV-V, renal US without acute findings but medical renal disease. Patient very insistent about discharge home and she will get established in the area on her own.  Fever - febrile x 1, given abdominal complaints obtained a CT scan which showed Fluid attenuation structures in the LEFT adnexa, largest 3.6 x 3.0 cm, differential diagnosis including non-opacified small bowel loops, ovarian cysts, and hydrosalpinx/tubo-ovarian abscess. This was further evaluated with [place] which confirmed cysts.  DM - AiC 7.3, Resumed her home Lantus as well as sliding scale Hypertension - Suspect this has been difficult to control as an outpatient, she is on clonidine, diltiazem, Lasix and hydralazine. Anemia - stable, no iron deficiency noted  Procedures:  None    Consultations:  None   Discharge Exam:  General: NAD Cardiovascular: RRR Respiratory: CTA biL  Discharge Instructions     Medication List    TAKE these medications        cloNIDine 0.3 MG tablet  Commonly known as:  CATAPRES  Take 1 tablet (0.3 mg total) by mouth 2 (two) times daily.     diltiazem 240 MG 24 hr capsule  Commonly known as:  DILACOR XR  Take 1 capsule (240 mg total) by mouth daily.     furosemide 40 MG tablet  Commonly known as:  LASIX  Take 1 tablet (40 mg total) by mouth daily as needed for fluid or edema.     hydrALAZINE 100 MG tablet  Commonly known as:  APRESOLINE  Take 1 tablet (100 mg total) by mouth 3 (three) times daily.     insulin glargine 100 UNIT/ML injection  Commonly known as:  LANTUS  Inject 5 Units into  the skin at bedtime.     NOVOLOG FLEXPEN 100 UNIT/ML FlexPen  Generic drug:  insulin aspart  Inject 1-10 Units into the skin 3 (three) times daily with meals.     Vitamin D (Ergocalciferol) 50000 UNITS Caps capsule  Commonly known as:  DRISDOL  Take 50,000 Units by mouth every 7 (seven) days.           Follow-up Information    Follow up with Please use the resources provided to you in emergency room by case manager to assist with doctor for follow up . Schedule an appointment as soon as possible for a visit on 09/13/2014.   Contact information:   These [place] uninsured resources provide possible primary care providers, resources for discounted medications, housing, dental resources, affordable care act information, plus other resources for [employer]        Follow up with [facility]    . Call today.   Why:  for an appointment   Contact information:   [address] [place] [address] [phone]      Follow up with [facility]. Call today.   Why:  to establish care   Contact information:   [address] [place] [address] [phone]       Follow up with [facility]. Call today.   Why:  to establish care   Contact information:   [address]  [address] [place] [address] [phone]      The results of significant diagnostics from this hospitalization (including imaging, microbiology, ancillary and laboratory) are listed below for reference.    Significant Diagnostic Studies: Ct Abdomen Pelvis Wo Contrast  09/09/2014   ADDENDUM REPORT: 09/09/2014 13:58  ADDENDUM: These results will be called to the ordering clinician or representative by the Radiologist Assistant, and communication documented in the PACS or zVision Dashboard.   Electronically Signed   By: Ulyses Southward M.D.   On: 09/09/2014 13:58   09/09/2014   CLINICAL DATA:  Chronic kidney disease, diabetes  mellitus, hypertension, intractable nausea and vomiting, gastritis  EXAM: CT ABDOMEN AND PELVIS WITHOUT CONTRAST  TECHNIQUE: Multidetector CT imaging of the abdomen and pelvis was performed following the standard protocol without IV contrast. Sagittal and coronal MPR images reconstructed from axial data set. Oral contrast was administered.  COMPARISON:  None  FINDINGS: Minimal atelectasis or ground-glass infiltrate in RIGHT lower lobe.  Heart appears enlarged.  Low-attenuation of circulating blood question anemia.  Extensive atherosclerotic calcifications.  Cyst at inferior pole LEFT kidney 3.0 x 2.6 cm.  Within limits of a nonenhanced exam, liver, spleen, pancreas, kidneys, and adrenal glands otherwise grossly normal appearance.  Distended gallbladder with question dependent density/tiny gallstones.  Normal appearing bladder and ureters.  Distal small bowel loops and colon are opacified by GI contrast, grossly unremarkable.  Suboptimal assessment of proximal small bowel loops and remainder of colon.  Stomach decompressed, wall appearing thickened though this could  be an artifact from underdistention.  Fluid collections in the LEFT adnexa, largest 3.6 x 3.0 cm image 68.  While these could potentially be related to unopacified proximal small bowel loops, none of the remaining small bowel loops demonstrate fluid attenuation contents.  Ovarian cysts and hydrosalpinx/tubo-ovarian abscess not excluded.  Unremarkable uterus and RIGHT adnexa.  No adenopathy, free air, or ascites.  Scattered infiltration of soft tissue planes suggesting hypoproteinemia.  No acute osseous findings.  IMPRESSION: Fluid attenuation structures in the LEFT adnexa, largest 3.6 x 3.0 cm, differential diagnosis including non-opacified small bowel loops, ovarian cysts, and hydrosalpinx/tubo-ovarian abscess.  Correlation with sonography recommended.  Extensive atherosclerotic disease.  Question gallstones.  Small LEFT renal cyst.  Suspect anemia.   Question gastric wall thickening versus artifact from underdistention ; recommend correlation with a cyst history and consider upper GI exam or upper endoscopy for further evaluation.  Electronically Signed: By: Ulyses Southward M.D. On: 09/09/2014 13:49   US Pelvis Complete  09/09/2014   CLINICAL DATA:  Evaluate cystic structures left adnexa seen on ct performed today  EXAM: TRANSABDOMINAL ULTRASOUND OF PELVIS  TECHNIQUE: Transabdominal ultrasound examination of the pelvis was performed including evaluation of the uterus, ovaries, adnexal regions, and pelvic cul-de-sac.  COMPARISON:  CT abdomen/pelvis performed earlier today  FINDINGS: Uterus  Measurements: 10.5 x 4.6 x 4.5 cm. No fibroids or other mass visualized.  Endometrium  Thickness: 5 mm.  No focal abnormality visualized.  Right ovary  Measurements: 56 x 24 x 44 mm. 2 cm cyst and 1.7 cm cyst present  Left ovary  Measurements: 40 x 24 x 32 mm. 2 cm cyst noted  Other findings:  No free fluid.  No hydrosalpinx identified.  IMPRESSION: Bilateral ovarian cysts. These likely account for the cystic lesions seen on the CT scan. However, transvaginal scan could not be performed at the current time as the bladder was full in the patient could not void. Would recommend completing this study with transvaginal imaging when the bladder can be emptied.   Electronically Signed   By: Esperanza Heir M.D.   On: 09/09/2014 23:51   US Renal  09/08/2014   CLINICAL DATA:  Renal failure.  Hypertension and diabetes.  EXAM: RENAL / URINARY TRACT ULTRASOUND COMPLETE  COMPARISON:  None.  FINDINGS: Right Kidney:  Length: 11.9 cm. No mass or hydronephrosis. Cortical echogenicity is increased.  Left Kidney:  Length: 11.7 cm. No hydronephrosis. Cortical echogenicity is increased. A cyst off the lower pole measures 3.2 x 2.6 x 2.6 cm. There appears to be a small amount of debris within the cyst.  Bladder:  Appears normal for degree of bladder distention.  IMPRESSION: Increased cortical  echogenicity in both kidneys consistent with medical renal disease.   Electronically Signed   By: Drusilla Kanner M.D.   On: 09/08/2014 17:28    Microbiology: Recent Results (from the past 240 hour(s))  Culture, blood (routine x 2)     Status: None (Preliminary result)   Collection Time: 09/08/14  9:35 PM  Result Value Ref Range Status   Specimen Description BLOOD RIGHT ANTECUBITAL  Final   Special Requests BOTTLES DRAWN AEROBIC ONLY 5CC  Final   Culture   Final    NO GROWTH 2 DAYS Performed at [facility]    Report Status PENDING  Incomplete  Culture, blood (routine x 2)     Status: None (Preliminary result)   Collection Time: 09/08/14  9:40 PM  Result Value Ref Range Status  Specimen Description BLOOD RIGHT HAND  Final   Special Requests BOTTLES DRAWN AEROBIC ONLY 5CC  Final   Culture   Final    NO GROWTH 2 DAYS Performed at [facility]    Report Status PENDING  Incomplete     Labs: Basic Metabolic Panel:  Recent Labs Lab 09/08/14 0810 09/09/14 0540  NA 144 148*  K 3.9 3.6  CL 109 113*  CO2 22 22  GLUCOSE 150* 45*  BUN 49* 46*  CREATININE 4.51* 4.35*  CALCIUM 9.4 9.4   Liver Function Tests:  Recent Labs Lab 09/08/14 0810  AST 16  ALT 11*  ALKPHOS 86  BILITOT 0.8  PROT 7.9  ALBUMIN 4.1    Recent Labs Lab 09/08/14 0810  LIPASE 13*   CBC:  Recent Labs Lab 09/08/14 0810 09/09/14 0540  WBC 7.9 8.4  HGB 9.3* 9.7*  HCT 30.0* 31.6*  MCV 86.7 87.3  PLT 227 260    CBG:  Recent Labs Lab 09/09/14 0752 09/09/14 1217 09/09/14 1647 09/09/14 2042 09/10/14 0754  GLUCAP 78 129* 223* 280* 329*       Signed:  Abbas Beyene  [facility] 09/10/2014, 4:18 PM

## 2014-09-13 LAB — CULTURE, BLOOD (ROUTINE X 2)
Culture: NO GROWTH
Culture: NO GROWTH

## 2014-09-22 ENCOUNTER — Ambulatory Visit: Payer: Medicare (Managed Care) | Attending: Internal Medicine

## 2014-10-01 ENCOUNTER — Encounter: Payer: Self-pay | Admitting: Family Medicine

## 2014-10-01 ENCOUNTER — Ambulatory Visit: Payer: Medicare (Managed Care) | Attending: Family Medicine | Admitting: Family Medicine

## 2014-10-01 VITALS — BP 167/72 | HR 54 | Temp 98.7°F | Resp 16 | Ht 68.0 in | Wt 179.0 lb

## 2014-10-01 DIAGNOSIS — K5909 Other constipation: Secondary | ICD-10-CM | POA: Insufficient documentation

## 2014-10-01 DIAGNOSIS — M7989 Other specified soft tissue disorders: Secondary | ICD-10-CM | POA: Insufficient documentation

## 2014-10-01 DIAGNOSIS — I10 Essential (primary) hypertension: Secondary | ICD-10-CM

## 2014-10-01 DIAGNOSIS — K5900 Constipation, unspecified: Secondary | ICD-10-CM

## 2014-10-01 DIAGNOSIS — Z114 Encounter for screening for human immunodeficiency virus [HIV]: Secondary | ICD-10-CM | POA: Diagnosis not present

## 2014-10-01 DIAGNOSIS — N189 Chronic kidney disease, unspecified: Secondary | ICD-10-CM | POA: Insufficient documentation

## 2014-10-01 DIAGNOSIS — F17200 Nicotine dependence, unspecified, uncomplicated: Secondary | ICD-10-CM | POA: Insufficient documentation

## 2014-10-01 DIAGNOSIS — Z720 Tobacco use: Secondary | ICD-10-CM

## 2014-10-01 DIAGNOSIS — E119 Type 2 diabetes mellitus without complications: Secondary | ICD-10-CM

## 2014-10-01 DIAGNOSIS — N184 Chronic kidney disease, stage 4 (severe): Secondary | ICD-10-CM | POA: Diagnosis not present

## 2014-10-01 DIAGNOSIS — I129 Hypertensive chronic kidney disease with stage 1 through stage 4 chronic kidney disease, or unspecified chronic kidney disease: Secondary | ICD-10-CM | POA: Insufficient documentation

## 2014-10-01 DIAGNOSIS — Z79899 Other long term (current) drug therapy: Secondary | ICD-10-CM | POA: Insufficient documentation

## 2014-10-01 DIAGNOSIS — Z794 Long term (current) use of insulin: Secondary | ICD-10-CM | POA: Insufficient documentation

## 2014-10-01 LAB — BASIC METABOLIC PANEL
BUN: 50 mg/dL — AB (ref 7–25)
CALCIUM: 8.8 mg/dL (ref 8.6–10.2)
CO2: 24 mmol/L (ref 20–31)
Chloride: 107 mmol/L (ref 98–110)
Creat: 4.17 mg/dL — ABNORMAL HIGH (ref 0.50–1.10)
Glucose, Bld: 90 mg/dL (ref 65–99)
POTASSIUM: 4.6 mmol/L (ref 3.5–5.3)
Sodium: 137 mmol/L (ref 135–146)

## 2014-10-01 LAB — GLUCOSE, POCT (MANUAL RESULT ENTRY): POC Glucose: 71 mg/dl (ref 70–99)

## 2014-10-01 MED ORDER — DILTIAZEM HCL ER COATED BEADS 180 MG PO CP24: 360.0000 mg | ORAL_CAPSULE | Freq: Every day | ORAL | Status: DC

## 2014-10-01 MED ORDER — HYDRALAZINE HCL 100 MG PO TABS: 100.0000 mg | ORAL_TABLET | Freq: Three times a day (TID) | ORAL | Status: DC

## 2014-10-01 MED ORDER — DILTIAZEM HCL ER COATED BEADS 360 MG PO CP24: 360.0000 mg | ORAL_CAPSULE | Freq: Every day | ORAL | Status: DC

## 2014-10-01 MED ORDER — FUROSEMIDE 40 MG PO TABS: 40.0000 mg | ORAL_TABLET | Freq: Every day | ORAL | Status: DC | PRN

## 2014-10-01 MED ORDER — CLONIDINE HCL 0.3 MG PO TABS: 0.3000 mg | ORAL_TABLET | Freq: Two times a day (BID) | ORAL | Status: DC

## 2014-10-01 MED ORDER — METOCLOPRAMIDE HCL 10 MG PO TABS: 10.0000 mg | ORAL_TABLET | Freq: Two times a day (BID) | ORAL | Status: DC

## 2014-10-01 NOTE — Patient Instructions (Addendum)
Crystal Tanner was seen today for establish care and diabetes.  Diagnoses and all orders for this visit:  Type 2 diabetes mellitus without complication -     POCT glucose (manual entry) -     Microalbumin/Creatinine Ratio, Urine -     Ambulatory referral to Ophthalmology  Screening for HIV (human immunodeficiency virus) -     HIV antibody (with reflex)  CKD (chronic kidney disease), stage 4 (severe) -     Basic Metabolic Panel -     Ambulatory referral to Nephrology  Chronic constipation -     Ambulatory referral to Gastroenterology -     metoCLOPramide (REGLAN) 10 MG tablet; Take 1 tablet (10 mg total) by mouth 2 (two) times daily.  Essential hypertension -     hydrALAZINE (APRESOLINE) 100 MG tablet; Take 1 tablet (100 mg total) by mouth 3 (three) times daily. -     furosemide (LASIX) 40 MG tablet; Take 1 tablet (40 mg total) by mouth daily as needed for fluid or edema. -     cloNIDine (CATAPRES) 0.3 MG tablet; Take 1 tablet (0.3 mg total) by mouth 2 (two) times daily. -     diltiazem (CARDIZEM CD) 360 MG 24 hr capsule; Take 1 capsule (360 mg total) by mouth daily.   F./u in 4 weeks for BP check with pharmacy team F/u with me in 8 weeks   Dr. Armen Pickup

## 2014-10-01 NOTE — Progress Notes (Signed)
Patient ID: Crystal Tanner, female   DOB: [birth year], 44 y.o.   MRN: [id number]   Subjective:  Patient ID: Crystal Tanner, female    DOB: Nov 10, 1970  Age: 44 y.o. MRN: [id number]  CC: Establish Care and Diabetes   HPI [name] presents for HTN    1. CHRONIC HYPERTENSION  Disease Monitoring  Blood pressure range: checks in AM   Chest pain: no   Dyspnea: no   Claudication: no   Medication compliance: yes  Medication Side Effects  Lightheadedness: no   Urinary frequency: no   Edema: yes    2. CHRONIC DIABETES  Disease Monitoring  Blood Sugar Ranges: well controlled   Polyuria: no   Visual problems: no   Medication Compliance: yes  Medication Side Effects  Hypoglycemia: no   3. CKD: not established with renal. Had renal US: medical renal disease. Has swelling in legs. Takes lasix prn swelling. Took lasix today.   Social History  Substance Use Topics  . Smoking status: Current Every Day Smoker  . Smokeless tobacco: Never Used  . Alcohol Use: No     Comment: less than a ppd, "not much"   Outpatient Prescriptions Prior to Visit  Medication Sig Dispense Refill  . cloNIDine (CATAPRES) 0.3 MG tablet Take 1 tablet (0.3 mg total) by mouth 2 (two) times daily. 10 tablet 0  . diltiazem (DILACOR XR) 240 MG 24 hr capsule Take 1 capsule (240 mg total) by mouth daily. 5 capsule 0  . furosemide (LASIX) 40 MG tablet Take 1 tablet (40 mg total) by mouth daily as needed for fluid or edema. 5 tablet 0  . hydrALAZINE (APRESOLINE) 100 MG tablet Take 1 tablet (100 mg total) by mouth 3 (three) times daily. 15 tablet 0  . insulin aspart (NOVOLOG FLEXPEN) 100 UNIT/ML FlexPen Inject 1-10 Units into the skin 3 (three) times daily with meals.    . insulin glargine (LANTUS) 100 UNIT/ML injection Inject 5 Units into the skin at bedtime.    . Vitamin D, Ergocalciferol, (DRISDOL) 50000 UNITS CAPS capsule Take 50,000 Units by mouth every 7 (seven) days.     No facility-administered medications  prior to visit.    ROS Review of Systems  Constitutional: Negative for fever and chills.  Eyes: Negative for visual disturbance.  Respiratory: Negative for shortness of breath.   Cardiovascular: Positive for leg swelling. Negative for chest pain.  Gastrointestinal: Positive for constipation. Negative for abdominal pain and blood in stool.  Musculoskeletal: Negative for back pain and arthralgias.  Skin: Negative for rash.  Allergic/Immunologic: Negative for immunocompromised state.  Hematological: Negative for adenopathy. Does not bruise/bleed easily.  Psychiatric/Behavioral: Negative for suicidal ideas and dysphoric mood.    Objective:  BP 167/72 mmHg  Pulse 54  Temp(Src) 98.7 F (37.1 C) (Oral)  Resp 16  Ht  (1.727 m)  Wt 179 lb (81.194 kg)  BMI 27.22 kg/m2  SpO2 99%  LMP 07/19/2014 (Approximate)  BP/Weight 10/01/2014 09/10/2014 09/09/2014  Systolic BP 167 198 -  Diastolic BP 72 71 -  Wt. (Lbs) 179 - 170  BMI 27.22 - -   Physical Exam  Constitutional: She is oriented to person, place, and time. She appears well-developed and well-nourished. No distress.  HENT:  Head: Normocephalic and atraumatic.  Cardiovascular: Normal rate, regular rhythm, normal heart sounds and intact distal pulses.   Pulmonary/Chest: Effort normal and breath sounds normal.  Musculoskeletal: She exhibits edema (1+ in legs and feet ).  Neurological: She is alert and oriented  to person, place, and time.  Skin: Skin is warm and dry. No rash noted.  Psychiatric: She has a normal mood and affect.    Lab Results  Component Value Date   HGBA1C 7.3* 09/08/2014   CBG 71 Assessment & Plan:   Problem List Items Addressed This Visit    Chronic constipation (Chronic)   Relevant Medications   metoCLOPramide (REGLAN) 10 MG tablet   Other Relevant Orders   Ambulatory referral to Gastroenterology   CKD (chronic kidney disease) (Chronic)   Relevant Orders   Basic Metabolic Panel   Ambulatory referral  to Nephrology   Diabetes mellitus - Primary (Chronic)   Relevant Orders   POCT glucose (manual entry) (Completed)   Microalbumin/Creatinine Ratio, Urine   Ambulatory referral to Ophthalmology   HTN (hypertension) (Chronic)   Relevant Medications   hydrALAZINE (APRESOLINE) 100 MG tablet   furosemide (LASIX) 40 MG tablet   cloNIDine (CATAPRES) 0.3 MG tablet   diltiazem (CARDIZEM CD) 360 MG 24 hr capsule    Other Visit Diagnoses    Screening for HIV (human immunodeficiency virus)        Relevant Orders    HIV antibody (with reflex)       No orders of the defined types were placed in this encounter.    Follow-up: Return in about 4 weeks (around 10/29/2014) for BP check with pharmacy team .  F/u with me in 8 weeks   Dessa Phi MD

## 2014-10-01 NOTE — Addendum Note (Signed)
Addended by: Dessa Phi on: 10/01/2014 03:32 PM   Modules accepted: Orders, SmartSet

## 2014-10-01 NOTE — Progress Notes (Signed)
Establish Care  F/U DM  Hx kidney problems  Hx tobacco 3 per day

## 2014-10-02 LAB — MICROALBUMIN / CREATININE URINE RATIO
CREATININE, URINE: 75.7 mg/dL
MICROALB UR: 91.6 mg/dL — AB (ref <2.0)
MICROALB/CREAT RATIO: 1210 mg/g — AB (ref 0.0–30.0)

## 2014-10-02 LAB — HIV ANTIBODY (ROUTINE TESTING W REFLEX): HIV 1&2 Ab, 4th Generation: NONREACTIVE

## 2014-10-05 ENCOUNTER — Encounter: Payer: Self-pay | Admitting: Internal Medicine

## 2014-10-07 ENCOUNTER — Telehealth: Payer: Self-pay | Admitting: *Deleted

## 2014-10-07 NOTE — Telephone Encounter (Signed)
Date of birth verified by pt Advised to continue taking medication as prescribed  Elevated microalbumin in urin  HIV negative Results given to pt Pt verbalized understanding

## 2014-10-07 NOTE — Telephone Encounter (Signed)
-----   Message from Dessa Phi, MD sent at 10/04/2014 10:20 AM EDT ----- Elevated urine microalbumin in setting of CKD Screening HIV negative Continue current treatment plan

## 2014-10-07 NOTE — Telephone Encounter (Signed)
-----   Message from Dessa Phi, MD sent at 10/04/2014 10:22 AM EDT ----- Elevated urine microalbumin in setting of CKD Screening HIV negative Continue current treatment plan

## 2014-11-01 ENCOUNTER — Encounter: Payer: Self-pay | Admitting: *Deleted

## 2014-11-11 ENCOUNTER — Emergency Department (HOSPITAL_COMMUNITY)
Admission: EM | Admit: 2014-11-11 | Discharge: 2014-11-11 | Disposition: A | Discharge status: Home / Self Care | Payer: Medicare (Managed Care) | Attending: Emergency Medicine | Admitting: Emergency Medicine

## 2014-11-11 DIAGNOSIS — I129 Hypertensive chronic kidney disease with stage 1 through stage 4 chronic kidney disease, or unspecified chronic kidney disease: Secondary | ICD-10-CM | POA: Insufficient documentation

## 2014-11-11 DIAGNOSIS — Z794 Long term (current) use of insulin: Secondary | ICD-10-CM | POA: Insufficient documentation

## 2014-11-11 DIAGNOSIS — R112 Nausea with vomiting, unspecified: Secondary | ICD-10-CM | POA: Insufficient documentation

## 2014-11-11 DIAGNOSIS — E119 Type 2 diabetes mellitus without complications: Secondary | ICD-10-CM | POA: Insufficient documentation

## 2014-11-11 DIAGNOSIS — Z862 Personal history of diseases of the blood and blood-forming organs and certain disorders involving the immune mechanism: Secondary | ICD-10-CM | POA: Insufficient documentation

## 2014-11-11 DIAGNOSIS — Z720 Tobacco use: Secondary | ICD-10-CM | POA: Insufficient documentation

## 2014-11-11 DIAGNOSIS — Z8719 Personal history of other diseases of the digestive system: Secondary | ICD-10-CM | POA: Insufficient documentation

## 2014-11-11 DIAGNOSIS — Z79899 Other long term (current) drug therapy: Secondary | ICD-10-CM | POA: Insufficient documentation

## 2014-11-11 DIAGNOSIS — N189 Chronic kidney disease, unspecified: Secondary | ICD-10-CM | POA: Insufficient documentation

## 2014-11-11 LAB — COMPREHENSIVE METABOLIC PANEL
ALBUMIN: 4.3 g/dL (ref 3.5–5.0)
AST: 12 U/L — AB (ref 15–41)
Alkaline Phosphatase: 78 U/L (ref 38–126)
Anion gap: 16 — ABNORMAL HIGH (ref 5–15)
BUN: 56 mg/dL — AB (ref 6–20)
CHLORIDE: 113 mmol/L — AB (ref 101–111)
CO2: 17 mmol/L — AB (ref 22–32)
CREATININE: 4.6 mg/dL — AB (ref 0.44–1.00)
Calcium: 9.4 mg/dL (ref 8.9–10.3)
GFR calc Af Amer: 12 mL/min — ABNORMAL LOW (ref >60)
GFR, EST NON AFRICAN AMERICAN: 11 mL/min — AB (ref >60)
Glucose, Bld: 176 mg/dL — ABNORMAL HIGH (ref 65–99)
POTASSIUM: 3.6 mmol/L (ref 3.5–5.1)
SODIUM: 146 mmol/L — AB (ref 135–145)
Total Bilirubin: 0.9 mg/dL (ref 0.3–1.2)
Total Protein: 7.6 g/dL (ref 6.5–8.1)

## 2014-11-11 LAB — CBG MONITORING, ED: GLUCOSE-CAPILLARY: 168 mg/dL — AB (ref 65–99)

## 2014-11-11 LAB — CBC WITH DIFFERENTIAL/PLATELET
BASOS ABS: 0 10*3/uL (ref 0.0–0.1)
BASOS PCT: 0 %
EOS ABS: 0.4 10*3/uL (ref 0.0–0.7)
EOS PCT: 4 %
HCT: 27.1 % — ABNORMAL LOW (ref 36.0–46.0)
Hemoglobin: 8.7 g/dL — ABNORMAL LOW (ref 12.0–15.0)
LYMPHS PCT: 15 %
Lymphs Abs: 1.2 10*3/uL (ref 0.7–4.0)
MCH: 27.8 pg (ref 26.0–34.0)
MCHC: 32.1 g/dL (ref 30.0–36.0)
MCV: 86.6 fL (ref 78.0–100.0)
MONO ABS: 0.4 10*3/uL (ref 0.1–1.0)
Monocytes Relative: 5 %
Neutro Abs: 6.4 10*3/uL (ref 1.7–7.7)
Neutrophils Relative %: 76 %
PLATELETS: 172 10*3/uL (ref 150–400)
RBC: 3.13 MIL/uL — AB (ref 3.87–5.11)
RDW: 15.3 % (ref 11.5–15.5)
WBC: 8.4 10*3/uL (ref 4.0–10.5)

## 2014-11-11 LAB — LIPASE, BLOOD: LIPASE: 18 U/L (ref 11–51)

## 2014-11-11 LAB — HCG, QUANTITATIVE, PREGNANCY: HCG, BETA CHAIN, QUANT, S: 1 m[IU]/mL (ref <5)

## 2014-11-11 LAB — I-STAT TROPONIN, ED: Troponin i, poc: 0 ng/mL (ref 0.00–0.08)

## 2014-11-11 MED ORDER — DIPHENHYDRAMINE HCL 50 MG/ML IJ SOLN
25.0000 mg | Freq: Once | INTRAMUSCULAR | Status: AC
Start: 2014-11-11 — End: 2014-11-11
  Administered 2014-11-11: 25 mg via INTRAVENOUS
  Filled 2014-11-11: qty 1

## 2014-11-11 MED ORDER — METOCLOPRAMIDE HCL 5 MG/ML IJ SOLN
10.0000 mg | Freq: Once | INTRAMUSCULAR | Status: AC
  Administered 2014-11-11: 10 mg via INTRAVENOUS
  Filled 2014-11-11: qty 2

## 2014-11-11 MED ORDER — METOCLOPRAMIDE HCL 10 MG PO TABS: 10.0000 mg | ORAL_TABLET | Freq: Three times a day (TID) | ORAL | Status: DC

## 2014-11-11 MED ORDER — SODIUM CHLORIDE 0.9 % IV SOLN
INTRAVENOUS | Status: DC
  Administered 2014-11-11: 03:00:00 via INTRAVENOUS

## 2014-11-11 MED ORDER — PROMETHAZINE HCL 25 MG/ML IJ SOLN
25.0000 mg | Freq: Once | INTRAMUSCULAR | Status: AC
  Administered 2014-11-11: 25 mg via INTRAVENOUS
  Filled 2014-11-11: qty 1

## 2014-11-11 MED ORDER — SODIUM CHLORIDE 0.9 % IV BOLUS (SEPSIS)
1000.0000 mL | Freq: Once | INTRAVENOUS | Status: AC
  Administered 2014-11-11: 1000 mL via INTRAVENOUS

## 2014-11-11 MED ORDER — ONDANSETRON HCL 4 MG/2ML IJ SOLN
4.0000 mg | Freq: Once | INTRAMUSCULAR | Status: AC
  Administered 2014-11-11: 4 mg via INTRAVENOUS
  Filled 2014-11-11: qty 2

## 2014-11-11 MED ORDER — HALOPERIDOL LACTATE 5 MG/ML IJ SOLN
2.0000 mg | Freq: Once | INTRAMUSCULAR | Status: AC
Start: 2014-11-11 — End: 2014-11-11
  Administered 2014-11-11: 2 mg via INTRAVENOUS
  Filled 2014-11-11: qty 1

## 2014-11-11 MED ORDER — PROMETHAZINE HCL 25 MG RE SUPP: 25.0000 mg | Freq: Four times a day (QID) | RECTAL | Status: DC | PRN

## 2014-11-11 MED ORDER — ONDANSETRON 4 MG PO TBDP: 4.0000 mg | ORAL_TABLET | Freq: Three times a day (TID) | ORAL | Status: DC | PRN

## 2014-11-11 NOTE — ED Notes (Signed)
Pt now able to rest a little better after medications. Pt still refusing EKG at this time.

## 2014-11-11 NOTE — ED Notes (Signed)
Pt refused EKG at this time.

## 2014-11-11 NOTE — ED Notes (Signed)
Patient refuses to acknowledge nursing staff when they enter the room, nor when they speak directly to her. Patient was questioned if she has any allergies to medications prior to admin of phenergan, patient does not answer. Patient was again reminded that we need a urine sample as soon as she can provide one, patient again does not acknowledge that she has heard or understood the request that is made. Family member remains at bedside, he also does not acknowledge staff at this time. Will con't to monitor patient status and to attempt urine collection.

## 2014-11-11 NOTE — ED Notes (Signed)
Patient lying with her head covered up, lights off when this nurse entered the room. Patient coughing, and making gagging sounds, no emesis at this time.

## 2014-11-11 NOTE — ED Notes (Signed)
RN Sharmon RevereJarita talked to pt about Cath to get urine sample.  Pt sts "no thank you"

## 2014-11-11 NOTE — ED Notes (Signed)
Blood in lab

## 2014-11-11 NOTE — ED Notes (Signed)
EKG and urine sample delayed.  Pt unable to be still for EKG to read, pt actively vomiting and shaking.  Pt aware that urine is needed but sts she has diarrhea at the moment.  RN and EDP notified.  Once pt symptoms are under control will revisit both.

## 2014-11-11 NOTE — ED Notes (Signed)
Pt went to restroom stated she would give sample. Pt did not give sample.

## 2014-11-11 NOTE — ED Notes (Signed)
Patient ambulated to restroom, toilet was heard flushing. Patient continues to refuse to provide urine sample. Patient given saltine crackers and diet gingerale. Patient was advised she needs to attempt to eat the crackers and drink so we can evaluate if she is able to tolerate POs.

## 2014-11-11 NOTE — ED Notes (Signed)
Gave pt of water for fluid challenge, informed pt that I would return in 15 mins or less to check her progress.

## 2014-11-11 NOTE — ED Notes (Signed)
Writer went into pt room to ask pt about fluid challenge.  Pt did not respond, instead she has covers over her head and family member sts she only took about 3 sips of water.  Pt informed that she needs to try to drink more, with no response from pt. RN notified.

## 2014-11-11 NOTE — ED Notes (Signed)
Checked on patient, she has not made an attempt to drink her gingerale or eat the crackers. The crackers remain unopened. MD at bedside.

## 2014-11-11 NOTE — ED Notes (Signed)
Bed: WA04 Expected date:  Expected time:  Means of arrival:  Comments: EMS 44 yo female from home, vomiting 2 hours ago, hypertensive-unable to keep BP meds down

## 2014-11-11 NOTE — ED Notes (Signed)
Pt c/o nausea and vomiting related to her menstrual cycle which started tonight. Pt states she takes medication for her blood pressure, but she was unable to keep the medication down.

## 2014-11-11 NOTE — ED Provider Notes (Addendum)
By signing my name below, I, Arlan Organshley Leger, attest that this documentation has been prepared under the direction and in the presence of Belinda Bringhurst N Salinda Snedeker, DO.  Electronically Signed: Arlan OrganAshley Leger, ED Scribe. 11/11/2014. 12:53 AM.   TIME SEEN: 12:49 AM   CHIEF COMPLAINT:  Chief Complaint  Patient presents with  . Nausea  . Emesis     HPI:  HPI Comments: Genelle BalCrystal Bornemann is a 44 y.o. female with a PMHx of CKD, HTN, DM, and gastritis, ? Gastroparesis who presents to the Emergency Department complaining of constant, ongoing nausea and vomiting onset 11:00 PM this evening. 5 episodes of vomiting reported since time of onset. No aggravating or alleviating factors. No OTC medications or home remedies attempted prior to arrival. She denies any recent fever, chills, abdominal pain, chest pain, shortness of breath, dizziness, lightheadedness, dysuria, or hematuria. No abnormal vaginal bleeding or discharge. She denies any known sick contacts. No international travel. Ms. Sherrine MaplesGlenn admits to a history of same related to gastroparesis. States she has had this many times in the past. [place]. LNMP- currently. History is limited as patient is poorly cooperative, vague with her history.  PCP: No primary care provider on file.    ROS: See HPI Constitutional: no fever  Eyes: no drainage  ENT: no runny nose   Cardiovascular:  no chest pain  Resp: no SOB  GI: Positive nausea and vomiting GU: no dysuria Integumentary: no rash  Allergy: no hives  Musculoskeletal: no leg swelling  Neurological: no slurred speech ROS otherwise negative  PAST MEDICAL HISTORY/PAST SURGICAL HISTORY:  Past Medical History  Diagnosis Date  . Hypertension   . Diabetes mellitus without complication (HCC) 1991  . Gastritis   . Chronic kidney disease   . Acute kidney injury (HCC)   . Anemia   . Renal cyst, left     MEDICATIONS:  Prior to Admission medications   Medication Sig Start Date  End Date Taking? Authorizing Provider  cloNIDine (CATAPRES) 0.3 MG tablet Take 1 tablet (0.3 mg total) by mouth 2 (two) times daily. 10/01/14   Josalyn Funches, MD  diltiazem (CARDIZEM CD) 180 MG 24 hr capsule Take 2 capsules (360 mg total) by mouth daily. 10/01/14   Josalyn Funches, MD  furosemide (LASIX) 40 MG tablet Take 1 tablet (40 mg total) by mouth daily as needed for fluid or edema. 10/01/14   Josalyn Funches, MD  hydrALAZINE (APRESOLINE) 100 MG tablet Take 1 tablet (100 mg total) by mouth 3 (three) times daily. 10/01/14   Josalyn Funches, MD  insulin aspart (NOVOLOG FLEXPEN) 100 UNIT/ML FlexPen Inject 1-10 Units into the skin 3 (three) times daily with meals.    Historical Provider, MD  insulin glargine (LANTUS) 100 UNIT/ML injection Inject 5 Units into the skin at bedtime.    Historical Provider, MD  metoCLOPramide (REGLAN) 10 MG tablet Take 1 tablet (10 mg total) by mouth 2 (two) times daily. 10/01/14   Josalyn Funches, MD  Vitamin D, Ergocalciferol, (DRISDOL) 50000 UNITS CAPS capsule Take 50,000 Units by mouth every 7 (seven) days.    Historical Provider, MD    ALLERGIES:  No Known Allergies  SOCIAL HISTORY:  Social History  Substance Use Topics  . Smoking status: Current Every Day Smoker  . Smokeless tobacco: Never Used  . Alcohol Use: No     Comment: less than a ppd, "not much"    FAMILY HISTORY: Family History  Problem Relation Age of Onset  . Hypertension  Mother   . Diabetes Mother     EXAM: BP 202/68 mmHg  Pulse 72  Temp(Src) 97.4 F (36.3 C) (Oral)  Resp 20  SpO2 99%  LMP 11/11/2014 CONSTITUTIONAL: Alert and oriented and responds appropriately to questions. Appears uncomfortable. Actively spitting. Patient is poorly cooperative. Difficult to obtain answers from. Difficult to get her to follow commands. HEAD: Normocephalic EYES: Conjunctivae clear, PERRL ENT: normal nose; no rhinorrhea; moist mucous membranes; pharynx without lesions noted NECK: Supple, no  meningismus, no LAD  CARD: RRR; S1 and S2 appreciated; no murmurs, no clicks, no rubs, no gallops RESP: Normal chest excursion without splinting or tachypnea; breath sounds clear and equal bilaterally; no wheezes, no rhonchi, no rales, no hypoxia or respiratory distress, speaking full sentences ABD/GI: Normal bowel sounds; non-distended; soft, non-tender, no rebound, no guarding, no peritoneal signs BACK:  The back appears normal and is non-tender to palpation, there is no CVA tenderness EXT: Normal ROM in all joints; non-tender to palpation; no edema; normal capillary refill; no cyanosis, no calf tenderness or swelling    SKIN: Normal color for age and race; warm NEURO: Moves all extremities equally, sensation to light touch intact diffusely, cranial nerves II through XII intact PSYCH: The patient's mood and manner are appropriate. Grooming and personal hygiene are appropriate.  MEDICAL DECISION MAKING: Patient here with nausea and vomiting. Reports a history of gastroparesis. Blood pressure elevated but states she cannot keep her medication down. She denies any chest pain, shortness of breath, headache, vision changes, neurologic deficits. Abdominal exam is benign. We'll obtain labs, urine, EKG. We'll give IV fluids, Reglan, Zofran and reassess. Blood glucose is 168.  ED PROGRESS: 2:30 AM  Pt refusing EKG.  Patient's labs show chronic kidney disease which is unchanged. She does have a very mild metabolic acidosis with a bicarbonate of 17 and an elevated anion gap. Blood glucose is 170s. This could be secondary to uremia. She denies any ingestions. We'll continue to hydrate patient. She states her nausea has not improved at all after Zofran and Reglan and she still appears very uncomfortable. We'll give second dose of Zofran and a dose of Phenergan and reassess.  4:15 AM  Pt has not had any further vomiting only spitting. She states she is feeling somewhat better but is still nauseous. She has been  able to drink a small amount but has been very uncooperative and frequently, her head with her blanket when he walked into the room. Discussed with patient and her significant other at bedside that if she continues to do well she will be discharged home. She still complains of feeling nauseous and is repetitively spitting but no active vomiting. Will give dose of IV Haldol and reassess.  5:15 AM  Pt refusing to eat crackers. She has been able to drink without vomiting in over 4 hours. She states she still feels like "my stomach is weak". Discussed with patient that given she has not vomited in several hours I feel she can be discharged with oral anti-emetics, Phenergan suppositories. Discussed with patient that I would like for her to try to eat some crackers. She refuses to do so. She states "just discharge me". She has been given outpatient follow-up during her last admission but did not follow-up with a primary care provider. I think there is a large component of noncompliance. She has been poorly cooperative. Discussed return precautions. She verbalized understanding.   I personally performed the services described in this documentation, which was scribed in my  presence. The recorded information has been reviewed and is accurate.   Layla Maw Beretta Ginsberg, DO 11/11/14 [id number]  Layla Maw Harpreet Signore, DO 11/11/14 [id number]

## 2014-11-11 NOTE — Discharge Instructions (Signed)
Nausea and Vomiting  Nausea is a sick feeling that often comes before throwing up (vomiting). Vomiting is a reflex where stomach contents come out of your mouth. Vomiting can cause severe loss of body fluids (dehydration). Children and elderly adults can become dehydrated quickly, especially if they also have diarrhea. Nausea and vomiting are symptoms of a condition or disease. It is important to find the cause of your symptoms.  CAUSES    Direct irritation of the stomach lining. This irritation can result from increased acid production (gastroesophageal reflux disease), infection, food poisoning, taking certain medicines (such as nonsteroidal anti-inflammatory drugs), alcohol use, or tobacco use.   Signals from the brain.These signals could be caused by a headache, heat exposure, an inner ear disturbance, increased pressure in the brain from injury, infection, a tumor, or a concussion, pain, emotional stimulus, or metabolic problems.   An obstruction in the gastrointestinal tract (bowel obstruction).   Illnesses such as diabetes, hepatitis, gallbladder problems, appendicitis, kidney problems, cancer, sepsis, atypical symptoms of a heart attack, or eating disorders.   Medical treatments such as chemotherapy and radiation.   Receiving medicine that makes you sleep (general anesthetic) during surgery.  DIAGNOSIS  Your caregiver may ask for tests to be done if the problems do not improve after a few days. Tests may also be done if symptoms are severe or if the reason for the nausea and vomiting is not clear. Tests may include:   Urine tests.   Blood tests.   Stool tests.   Cultures (to look for evidence of infection).   X-rays or other imaging studies.  Test results can help your caregiver make decisions about treatment or the need for additional tests.  TREATMENT  You need to stay well hydrated. Drink frequently but in small amounts.You may wish to drink water, sports drinks, clear broth, or eat frozen  ice pops or gelatin dessert to help stay hydrated.When you eat, eating slowly may help prevent nausea.There are also some antinausea medicines that may help prevent nausea.  HOME CARE INSTRUCTIONS    Take all medicine as directed by your caregiver.   If you do not have an appetite, do not force yourself to eat. However, you must continue to drink fluids.   If you have an appetite, eat a normal diet unless your caregiver tells you differently.    Eat a variety of complex carbohydrates (rice, wheat, potatoes, bread), lean meats, yogurt, fruits, and vegetables.    Avoid high-fat foods because they are more difficult to digest.   Drink enough water and fluids to keep your urine clear or pale yellow.   If you are dehydrated, ask your caregiver for specific rehydration instructions. Signs of dehydration may include:    Severe thirst.    Dry lips and mouth.    Dizziness.    Dark urine.    Decreasing urine frequency and amount.    Confusion.    Rapid breathing or pulse.  SEEK IMMEDIATE MEDICAL CARE IF:    You have blood or brown flecks (like coffee grounds) in your vomit.   You have black or bloody stools.   You have a severe headache or stiff neck.   You are confused.   You have severe abdominal pain.   You have chest pain or trouble breathing.   You do not urinate at least once every 8 hours.   You develop cold or clammy skin.   You continue to vomit for longer than 24 to 48 hours.     You have a fever.  MAKE SURE YOU:    Understand these instructions.   Will watch your condition.   Will get help right away if you are not doing well or get worse.     This information is not intended to replace advice given to you by your health care provider. Make sure you discuss any questions you have with your health care provider.     Document Released: 12/18/2004 Document Revised: 03/12/2011 Document Reviewed: 05/17/2010  Elsevier Interactive Patient Education 2016 [employer]  Gastroparesis  Gastroparesis, also  called delayed gastric emptying, is a condition in which food takes longer than normal to empty from the stomach. The condition is usually long-lasting (chronic).  CAUSES  This condition may be caused by:   An endocrine disorder, such as hypothyroidism or diabetes. Diabetes is the most common cause of this condition.   A nervous system disease, such as Parkinson disease or multiple sclerosis.   Cancer, infection, or surgery of the stomach or vagus nerve.   A connective tissue disorder, such as scleroderma.   Certain medicines.  In most cases, the cause is not known.  RISK FACTORS  This condition is more likely to develop in:   People with certain disorders, including endocrine disorders, eating disorders, amyloidosis, and scleroderma.   People with certain diseases, including Parkinson disease or multiple sclerosis.   People with cancer or infection of the stomach or vagus nerve.   People who have had surgery on the stomach or vagus nerve.   People who take certain medicines.   Women.  SYMPTOMS  Symptoms of this condition include:   An early feeling of fullness when eating.   Nausea.   Weight loss.   Vomiting.   Heartburn.   Abdominal bloating.   Inconsistent blood glucose levels.   Lack of appetite.   Acid from the stomach coming up into the esophagus (gastroesophageal reflux).   Spasms of the stomach.  Symptoms may come and go.  DIAGNOSIS  This condition is diagnosed with tests, such as:   Tests that check how long it takes food to move through the stomach and intestines. These tests include:   Upper gastrointestinal (GI) series. In this test, X-rays of the intestines are taken after you drink a liquid. The liquid makes the intestines show up better on the X-rays.   Gastric emptying scintigraphy. In this test, scans are taken after you eat food that contains a small amount of radioactive material.   Wireless capsule GI monitoring system. This test involves swallowing a capsule that records  information about movement through the stomach.   Gastric manometry. This test measures electrical and muscular activity in the stomach. It is done with a thin tube that is passed down the throat and into the stomach.   Endoscopy. This test checks for abnormalities in the lining of the stomach. It is done with a long, thin tube that is passed down the throat and into the stomach.   An ultrasound. This test can help rule out gallbladder disease or pancreatitis as a cause of your symptoms. It uses sound waves to take pictures of the inside of your body.  TREATMENT  There is no cure for gastroparesis. This condition may be managed with:   Treatment of the underlying condition causing the gastroparesis.   Lifestyle changes, including exercise and dietary changes. Dietary changes can include:   Changes in what and when you eat.   Eating smaller meals more often.   Eating   low-fat foods.   Eating low-fiber forms of high-fiber foods, such as cooked vegetables instead of raw vegetables.   Having liquid foods in place of solid foods. Liquid foods are easier to digest.   Medicines. These may be given to control nausea and vomiting and to stimulate stomach muscles.   Getting food through a feeding tube. This may be done in severe cases.   A gastric neurostimulator. This is a device that is inserted into the body with surgery. It helps improve stomach emptying and control nausea and vomiting.  HOME CARE INSTRUCTIONS   Follow your health care provider's instructions about exercise and diet.   Take medicines only as directed by your health care provider.  SEEK MEDICAL CARE IF:   Your symptoms do not improve with treatment.   You have new symptoms.  SEEK IMMEDIATE MEDICAL CARE IF:   You have severe abdominal pain that does not improve with treatment.   You have nausea that does not go away.   You cannot keep fluids down.     This information is not intended to replace advice given to you by your health care  provider. Make sure you discuss any questions you have with your health care provider.     Document Released: 12/18/2004 Document Revised: 05/04/2014 Document Reviewed: 12/14/2013  Elsevier Interactive Patient Education 2016 [employer]

## 2014-11-29 ENCOUNTER — Ambulatory Visit: Payer: Medicare (Managed Care) | Admitting: Internal Medicine

## 2014-12-15 ENCOUNTER — Other Ambulatory Visit: Payer: Self-pay | Admitting: Family Medicine

## 2014-12-18 ENCOUNTER — Inpatient Hospital Stay (HOSPITAL_COMMUNITY): Payer: Medicare (Managed Care)

## 2014-12-18 ENCOUNTER — Inpatient Hospital Stay (HOSPITAL_COMMUNITY)
Admission: EM | Admit: 2014-12-18 | Discharge: 2014-12-20 | DRG: 683 | Disposition: A | Discharge status: Home / Self Care | Payer: Medicare (Managed Care) | Attending: Internal Medicine | Admitting: Internal Medicine

## 2014-12-18 ENCOUNTER — Encounter (HOSPITAL_COMMUNITY): Payer: Self-pay | Admitting: Emergency Medicine

## 2014-12-18 DIAGNOSIS — N179 Acute kidney failure, unspecified: Secondary | ICD-10-CM | POA: Diagnosis present

## 2014-12-18 DIAGNOSIS — E860 Dehydration: Secondary | ICD-10-CM | POA: Diagnosis not present

## 2014-12-18 DIAGNOSIS — D631 Anemia in chronic kidney disease: Secondary | ICD-10-CM | POA: Diagnosis present

## 2014-12-18 DIAGNOSIS — Z794 Long term (current) use of insulin: Secondary | ICD-10-CM

## 2014-12-18 DIAGNOSIS — E872 Acidosis, unspecified: Secondary | ICD-10-CM

## 2014-12-18 DIAGNOSIS — R1110 Vomiting, unspecified: Secondary | ICD-10-CM

## 2014-12-18 DIAGNOSIS — E1143 Type 2 diabetes mellitus with diabetic autonomic (poly)neuropathy: Secondary | ICD-10-CM | POA: Diagnosis present

## 2014-12-18 DIAGNOSIS — N185 Chronic kidney disease, stage 5: Secondary | ICD-10-CM | POA: Diagnosis present

## 2014-12-18 DIAGNOSIS — E1122 Type 2 diabetes mellitus with diabetic chronic kidney disease: Secondary | ICD-10-CM

## 2014-12-18 DIAGNOSIS — I150 Renovascular hypertension: Secondary | ICD-10-CM | POA: Diagnosis present

## 2014-12-18 DIAGNOSIS — A419 Sepsis, unspecified organism: Secondary | ICD-10-CM | POA: Diagnosis present

## 2014-12-18 DIAGNOSIS — F17200 Nicotine dependence, unspecified, uncomplicated: Secondary | ICD-10-CM | POA: Diagnosis present

## 2014-12-18 DIAGNOSIS — N184 Chronic kidney disease, stage 4 (severe): Secondary | ICD-10-CM | POA: Diagnosis present

## 2014-12-18 DIAGNOSIS — I129 Hypertensive chronic kidney disease with stage 1 through stage 4 chronic kidney disease, or unspecified chronic kidney disease: Principal | ICD-10-CM | POA: Diagnosis present

## 2014-12-18 DIAGNOSIS — N189 Chronic kidney disease, unspecified: Secondary | ICD-10-CM | POA: Diagnosis not present

## 2014-12-18 DIAGNOSIS — D649 Anemia, unspecified: Secondary | ICD-10-CM | POA: Diagnosis present

## 2014-12-18 DIAGNOSIS — K5909 Other constipation: Secondary | ICD-10-CM | POA: Diagnosis present

## 2014-12-18 DIAGNOSIS — F17210 Nicotine dependence, cigarettes, uncomplicated: Secondary | ICD-10-CM | POA: Diagnosis present

## 2014-12-18 DIAGNOSIS — Z720 Tobacco use: Secondary | ICD-10-CM | POA: Diagnosis not present

## 2014-12-18 DIAGNOSIS — R112 Nausea with vomiting, unspecified: Secondary | ICD-10-CM | POA: Insufficient documentation

## 2014-12-18 DIAGNOSIS — R6510 Systemic inflammatory response syndrome (SIRS) of non-infectious origin without acute organ dysfunction: Secondary | ICD-10-CM | POA: Diagnosis present

## 2014-12-18 DIAGNOSIS — Z833 Family history of diabetes mellitus: Secondary | ICD-10-CM

## 2014-12-18 DIAGNOSIS — Z79899 Other long term (current) drug therapy: Secondary | ICD-10-CM

## 2014-12-18 DIAGNOSIS — E11649 Type 2 diabetes mellitus with hypoglycemia without coma: Secondary | ICD-10-CM | POA: Diagnosis present

## 2014-12-18 DIAGNOSIS — K3184 Gastroparesis: Secondary | ICD-10-CM | POA: Diagnosis present

## 2014-12-18 DIAGNOSIS — Z8249 Family history of ischemic heart disease and other diseases of the circulatory system: Secondary | ICD-10-CM

## 2014-12-18 LAB — URINE MICROSCOPIC-ADD ON

## 2014-12-18 LAB — COMPREHENSIVE METABOLIC PANEL
ALBUMIN: 4.8 g/dL (ref 3.5–5.0)
ALT: 10 U/L — AB (ref 14–54)
AST: 17 U/L (ref 15–41)
Alkaline Phosphatase: 98 U/L (ref 38–126)
Anion gap: 20 — ABNORMAL HIGH (ref 5–15)
BUN: 61 mg/dL — AB (ref 6–20)
CHLORIDE: 106 mmol/L (ref 101–111)
CO2: 16 mmol/L — AB (ref 22–32)
CREATININE: 5.43 mg/dL — AB (ref 0.44–1.00)
Calcium: 9.4 mg/dL (ref 8.9–10.3)
GFR calc Af Amer: 10 mL/min — ABNORMAL LOW (ref >60)
GFR calc non Af Amer: 9 mL/min — ABNORMAL LOW (ref >60)
GLUCOSE: 316 mg/dL — AB (ref 65–99)
POTASSIUM: 4.1 mmol/L (ref 3.5–5.1)
SODIUM: 142 mmol/L (ref 135–145)
Total Bilirubin: 1.1 mg/dL (ref 0.3–1.2)
Total Protein: 8 g/dL (ref 6.5–8.1)

## 2014-12-18 LAB — URINALYSIS, ROUTINE W REFLEX MICROSCOPIC
Bilirubin Urine: NEGATIVE
GLUCOSE, UA: 500 mg/dL — AB
Hgb urine dipstick: NEGATIVE
KETONES UR: NEGATIVE mg/dL
LEUKOCYTES UA: NEGATIVE
NITRITE: NEGATIVE
PH: 5 (ref 5.0–8.0)
Protein, ur: >300 mg/dL — AB
SPECIFIC GRAVITY, URINE: 1.015 (ref 1.005–1.030)

## 2014-12-18 LAB — CBC WITH DIFFERENTIAL/PLATELET
BASOS ABS: 0 10*3/uL (ref 0.0–0.1)
BASOS PCT: 0 %
EOS PCT: 0 %
Eosinophils Absolute: 0 10*3/uL (ref 0.0–0.7)
HCT: 27.1 % — ABNORMAL LOW (ref 36.0–46.0)
Hemoglobin: 8.9 g/dL — ABNORMAL LOW (ref 12.0–15.0)
LYMPHS PCT: 5 %
Lymphs Abs: 0.6 10*3/uL — ABNORMAL LOW (ref 0.7–4.0)
MCH: 28.6 pg (ref 26.0–34.0)
MCHC: 32.8 g/dL (ref 30.0–36.0)
MCV: 87.1 fL (ref 78.0–100.0)
Monocytes Absolute: 0.3 10*3/uL (ref 0.1–1.0)
Monocytes Relative: 3 %
Neutro Abs: 10.8 10*3/uL — ABNORMAL HIGH (ref 1.7–7.7)
Neutrophils Relative %: 92 %
PLATELETS: 225 10*3/uL (ref 150–400)
RBC: 3.11 MIL/uL — AB (ref 3.87–5.11)
RDW: 13.6 % (ref 11.5–15.5)
WBC: 11.7 10*3/uL — AB (ref 4.0–10.5)

## 2014-12-18 LAB — BLOOD GAS, VENOUS
Acid-base deficit: 6.4 mmol/L — ABNORMAL HIGH (ref 0.0–2.0)
Bicarbonate: 16.2 mEq/L — ABNORMAL LOW (ref 20.0–24.0)
O2 SAT: 95.7 %
PATIENT TEMPERATURE: 98.6
PH VEN: 7.443 — AB (ref 7.250–7.300)
TCO2: 15.1 mmol/L (ref 0–100)
pCO2, Ven: 24.1 mmHg — ABNORMAL LOW (ref 45.0–50.0)
pO2, Ven: 81.2 mmHg — ABNORMAL HIGH (ref 30.0–45.0)

## 2014-12-18 LAB — BASIC METABOLIC PANEL
ANION GAP: 21 — AB (ref 5–15)
BUN: 62 mg/dL — AB (ref 6–20)
CHLORIDE: 104 mmol/L (ref 101–111)
CO2: 15 mmol/L — ABNORMAL LOW (ref 22–32)
Calcium: 9.6 mg/dL (ref 8.9–10.3)
Creatinine, Ser: 5.32 mg/dL — ABNORMAL HIGH (ref 0.44–1.00)
GFR calc Af Amer: 10 mL/min — ABNORMAL LOW (ref >60)
GFR calc non Af Amer: 9 mL/min — ABNORMAL LOW (ref >60)
GLUCOSE: 438 mg/dL — AB (ref 65–99)
POTASSIUM: 3.4 mmol/L — AB (ref 3.5–5.1)
SODIUM: 140 mmol/L (ref 135–145)

## 2014-12-18 LAB — CBG MONITORING, ED
GLUCOSE-CAPILLARY: 286 mg/dL — AB (ref 65–99)
Glucose-Capillary: 404 mg/dL — ABNORMAL HIGH (ref 65–99)
Glucose-Capillary: 161 mg/dL — ABNORMAL HIGH (ref 65–99)

## 2014-12-18 LAB — HCG, QUANTITATIVE, PREGNANCY: HCG, BETA CHAIN, QUANT, S: 1 m[IU]/mL (ref <5)

## 2014-12-18 LAB — LACTIC ACID, PLASMA: Lactic Acid, Venous: 2.6 mmol/L (ref 0.5–2.0)

## 2014-12-18 LAB — CBC
HEMATOCRIT: 27.6 % — AB (ref 36.0–46.0)
HEMOGLOBIN: 9 g/dL — AB (ref 12.0–15.0)
MCH: 28.3 pg (ref 26.0–34.0)
MCHC: 32.6 g/dL (ref 30.0–36.0)
MCV: 86.8 fL (ref 78.0–100.0)
Platelets: 215 10*3/uL (ref 150–400)
RBC: 3.18 MIL/uL — ABNORMAL LOW (ref 3.87–5.11)
RDW: 13.3 % (ref 11.5–15.5)
WBC: 15.3 10*3/uL — AB (ref 4.0–10.5)

## 2014-12-18 LAB — PROTIME-INR
INR: 1.15 (ref 0.00–1.49)
Prothrombin Time: 14.9 seconds (ref 11.6–15.2)

## 2014-12-18 LAB — GLUCOSE, CAPILLARY: GLUCOSE-CAPILLARY: 311 mg/dL — AB (ref 65–99)

## 2014-12-18 LAB — I-STAT BETA HCG BLOOD, ED (MC, WL, AP ONLY): HCG, QUANTITATIVE: 5.8 m[IU]/mL — AB (ref <5)

## 2014-12-18 LAB — APTT: aPTT: 31 seconds (ref 24–37)

## 2014-12-18 LAB — TROPONIN I: Troponin I: 0.05 ng/mL — ABNORMAL HIGH (ref <0.031)

## 2014-12-18 LAB — TSH: TSH: 0.573 u[IU]/mL (ref 0.350–4.500)

## 2014-12-18 LAB — PROCALCITONIN: Procalcitonin: 1.33 ng/mL

## 2014-12-18 MED ORDER — ACETAMINOPHEN 325 MG PO TABS: 650.0000 mg | ORAL_TABLET | Freq: Four times a day (QID) | ORAL | Status: DC | PRN

## 2014-12-18 MED ORDER — ENOXAPARIN SODIUM 30 MG/0.3ML ~~LOC~~ SOLN
30.0000 mg | SUBCUTANEOUS | Status: DC
  Administered 2014-12-18 – 2014-12-19 (×2): 30 mg via SUBCUTANEOUS
  Filled 2014-12-18 (×3): qty 0.3

## 2014-12-18 MED ORDER — NICOTINE 21 MG/24HR TD PT24
21.0000 mg | MEDICATED_PATCH | Freq: Every day | TRANSDERMAL | Status: DC
  Filled 2014-12-18 (×3): qty 1

## 2014-12-18 MED ORDER — ONDANSETRON HCL 4 MG PO TABS: 4.0000 mg | ORAL_TABLET | Freq: Four times a day (QID) | ORAL | Status: DC | PRN

## 2014-12-18 MED ORDER — SODIUM CHLORIDE 0.9 % IJ SOLN
3.0000 mL | Freq: Two times a day (BID) | INTRAMUSCULAR | Status: DC
  Administered 2014-12-18: 3 mL via INTRAVENOUS

## 2014-12-18 MED ORDER — SODIUM CHLORIDE 0.9 % IV BOLUS (SEPSIS)
1000.0000 mL | Freq: Once | INTRAVENOUS | Status: AC
  Administered 2014-12-18: 1000 mL via INTRAVENOUS

## 2014-12-18 MED ORDER — HYDRALAZINE HCL 50 MG PO TABS
100.0000 mg | ORAL_TABLET | Freq: Three times a day (TID) | ORAL | Status: DC
  Administered 2014-12-18 – 2014-12-20 (×5): 100 mg via ORAL
  Filled 2014-12-18 (×7): qty 2

## 2014-12-18 MED ORDER — PIPERACILLIN-TAZOBACTAM IN DEX 2-0.25 GM/50ML IV SOLN
2.2500 g | Freq: Three times a day (TID) | INTRAVENOUS | Status: DC
  Administered 2014-12-18 – 2014-12-19 (×3): 2.25 g via INTRAVENOUS
  Filled 2014-12-18 (×4): qty 50

## 2014-12-18 MED ORDER — SODIUM CHLORIDE 0.9 % IV BOLUS (SEPSIS)
1000.0000 mL | INTRAVENOUS | Status: AC
  Administered 2014-12-18 (×2): 1000 mL via INTRAVENOUS

## 2014-12-18 MED ORDER — DILTIAZEM HCL ER COATED BEADS 180 MG PO CP24
360.0000 mg | ORAL_CAPSULE | Freq: Every day | ORAL | Status: DC
  Administered 2014-12-19 – 2014-12-20 (×2): 360 mg via ORAL
  Filled 2014-12-18 (×2): qty 2

## 2014-12-18 MED ORDER — DEXTROSE-NACL 5-0.45 % IV SOLN
INTRAVENOUS | Status: DC
  Administered 2014-12-18: 17:00:00 via INTRAVENOUS

## 2014-12-18 MED ORDER — INSULIN GLARGINE 100 UNIT/ML ~~LOC~~ SOLN
5.0000 [IU] | Freq: Every day | SUBCUTANEOUS | Status: DC
  Administered 2014-12-18: 5 [IU] via SUBCUTANEOUS
  Filled 2014-12-18: qty 0.05

## 2014-12-18 MED ORDER — POTASSIUM CHLORIDE 10 MEQ/100ML IV SOLN
10.0000 meq | Freq: Once | INTRAVENOUS | Status: AC
  Administered 2014-12-18: 10 meq via INTRAVENOUS
  Filled 2014-12-18: qty 100

## 2014-12-18 MED ORDER — SODIUM CHLORIDE 0.9 % IV SOLN
INTRAVENOUS | Status: DC
  Administered 2014-12-18: 2.3 [IU]/h via INTRAVENOUS
  Filled 2014-12-18: qty 2.5

## 2014-12-18 MED ORDER — ONDANSETRON HCL 4 MG/2ML IJ SOLN
4.0000 mg | Freq: Once | INTRAMUSCULAR | Status: AC | PRN
  Administered 2014-12-18: 4 mg via INTRAVENOUS
  Filled 2014-12-18: qty 2

## 2014-12-18 MED ORDER — POTASSIUM CHLORIDE CRYS ER 20 MEQ PO TBCR
40.0000 meq | EXTENDED_RELEASE_TABLET | Freq: Once | ORAL | Status: AC
  Administered 2014-12-18: 40 meq via ORAL
  Filled 2014-12-18: qty 2

## 2014-12-18 MED ORDER — SODIUM CHLORIDE 0.9 % IV BOLUS (SEPSIS)
500.0000 mL | INTRAVENOUS | Status: AC
  Administered 2014-12-18: 500 mL via INTRAVENOUS

## 2014-12-18 MED ORDER — STERILE WATER FOR INJECTION IV SOLN
INTRAVENOUS | Status: DC
  Administered 2014-12-18 – 2014-12-20 (×4): via INTRAVENOUS
  Filled 2014-12-18 (×5): qty 850

## 2014-12-18 MED ORDER — ONDANSETRON HCL 4 MG/2ML IJ SOLN
4.0000 mg | Freq: Once | INTRAMUSCULAR | Status: AC
  Administered 2014-12-18: 4 mg via INTRAVENOUS
  Filled 2014-12-18: qty 2

## 2014-12-18 MED ORDER — ONDANSETRON HCL 4 MG/2ML IJ SOLN
4.0000 mg | Freq: Four times a day (QID) | INTRAMUSCULAR | Status: DC | PRN
  Filled 2014-12-18: qty 2

## 2014-12-18 MED ORDER — VANCOMYCIN HCL IN DEXTROSE 1-5 GM/200ML-% IV SOLN
1000.0000 mg | INTRAVENOUS | Status: DC
  Administered 2014-12-18: 1000 mg via INTRAVENOUS
  Filled 2014-12-18: qty 200

## 2014-12-18 MED ORDER — SODIUM CHLORIDE 0.9 % IV SOLN
INTRAVENOUS | Status: AC
  Administered 2014-12-18: 22:00:00 via INTRAVENOUS

## 2014-12-18 MED ORDER — CLONIDINE HCL 0.3 MG PO TABS
0.3000 mg | ORAL_TABLET | Freq: Two times a day (BID) | ORAL | Status: DC
  Administered 2014-12-18 – 2014-12-20 (×4): 0.3 mg via ORAL
  Filled 2014-12-18 (×5): qty 1

## 2014-12-18 MED ORDER — ACETAMINOPHEN 650 MG RE SUPP: 650.0000 mg | Freq: Four times a day (QID) | RECTAL | Status: DC | PRN

## 2014-12-18 MED ORDER — HYDROCODONE-ACETAMINOPHEN 5-325 MG PO TABS: 1.0000 | ORAL_TABLET | ORAL | Status: DC | PRN

## 2014-12-18 MED ORDER — INSULIN ASPART 100 UNIT/ML ~~LOC~~ SOLN
0.0000 [IU] | SUBCUTANEOUS | Status: DC
  Administered 2014-12-18: 7 [IU] via SUBCUTANEOUS
  Administered 2014-12-19 (×2): 1 [IU] via SUBCUTANEOUS
  Administered 2014-12-19: 2 [IU] via SUBCUTANEOUS
  Administered 2014-12-19: 3 [IU] via SUBCUTANEOUS
  Administered 2014-12-19: 2 [IU] via SUBCUTANEOUS
  Administered 2014-12-20 (×3): 1 [IU] via SUBCUTANEOUS

## 2014-12-18 NOTE — Progress Notes (Signed)
Patient refused ordered X-Rays of her chest and abdomen. Patient reports that she will do them in the morning. Ordering MD made aware. Will continue to monitor.

## 2014-12-18 NOTE — ED Notes (Signed)
Schlossman verbal to discontinue insulin and dextrose.

## 2014-12-18 NOTE — H&P (Signed)
PCP: none   Referring provider Schlossman   Chief Complaint:  Nausea and vomiting  HPI: Crystal Tanner is a 44 y.o. female   has a past medical history of Hypertension; Diabetes mellitus without complication (HCC) (1991); Gastritis; Chronic kidney disease; Acute kidney injury (HCC); Anemia; and Renal cyst, left.   Presented with   1 year ago she was diagnosed with CKD but did not follow up. in September her Creatinine was 4.6. This AM at 5 she started to have nausea and vomiting. Prior to this reports constipation but today noted 8 BM's which she states were solid. Hot and cold all day. On arrival to the [facility] patient was also found to be febrile up to 100.7 . Some cough no shortness of breath feels palpitations no fever at home. No chest pain.  Was found to have Acute on chronic renal failure with Cr up to 5.32 with bicarbonate of 15 and anion gap of 21 She reports using Insulin on regular bases with good control.  Reports taking BP meds but not today due to vomiting.  This patient fought to have DKA given elevated blood sugar but proved to have no ketones in her urine. This initially was discussed with nephrology recommended admission on bicarbonate drip to stabilize though see her in the morning. Grade 2 admission to [facility] long.  Hospitalist was called for admission for metobolic acidosis with acute on chronic renal failure.   Review of Systems:    Pertinent positives include:   nausea, vomiting, chills, fatigue, weight loss non-productive cough,   Constitutional:  No weight loss, night sweats, Fevers, HEENT:  No headaches, Difficulty swallowing,Tooth/dental problems,Sore throat,   No sneezing, itching, ear ache, nasal congestion, post nasal drip,  Cardio-vascular:  No chest pain, Orthopnea, PND, anasarca, dizziness, palpitations.no Bilateral lower extremity swelling  GI:  No heartburn, indigestion, abdominal pain, diarrhea, change in bowel habits, loss of appetite, melena,  blood in stool, hematemesis Resp:  no shortness of breath at rest. No dyspnea on exertion, No excess mucus, no productive cough, NoNo coughing up of blood.No change in color of mucus.No wheezing. Skin:  no rash or lesions. No jaundice GU:  no dysuria, change in color of urine, no urgency or frequency. No straining to urinate.  No flank pain.  Musculoskeletal:  No joint pain or no joint swelling. No decreased range of motion. No back pain.  Psych:  No change in mood or affect. No depression or anxiety. No memory loss.  Neuro: no localizing neurological complaints, no tingling, no weakness, no double vision, no gait abnormality, no slurred speech, no confusion  Otherwise ROS are negative except for above, 10 systems were reviewed  Past Medical History: Past Medical History  Diagnosis Date  . Hypertension   . Diabetes mellitus without complication (HCC) 1991  . Gastritis   . Chronic kidney disease   . Acute kidney injury (HCC)   . Anemia   . Renal cyst, left    History reviewed. No pertinent past surgical history.   Medications: Prior to Admission medications   Medication Sig Start Date End Date Taking? Authorizing Provider  acetaminophen (TYLENOL) 500 MG tablet Take 1,000 mg by mouth every 6 (six) hours as needed for moderate pain.   Yes Historical Provider, MD  cloNIDine (CATAPRES) 0.3 MG tablet Take 1 tablet (0.3 mg total) by mouth 2 (two) times daily. 10/01/14  Yes Josalyn Funches, MD  diltiazem (CARDIZEM CD) 180 MG 24 hr capsule Take 2 capsules (360 mg total) by  mouth daily. 10/01/14  Yes Josalyn Funches, MD  furosemide (LASIX) 40 MG tablet Take 1 tablet (40 mg total) by mouth daily as needed for fluid or edema. 10/01/14  Yes Josalyn Funches, MD  hydrALAZINE (APRESOLINE) 100 MG tablet Take 1 tablet (100 mg total) by mouth 3 (three) times daily. 10/01/14  Yes Josalyn Funches, MD  insulin aspart (NOVOLOG FLEXPEN) 100 UNIT/ML FlexPen Inject 1-10 Units into the skin 3 (three) times  daily with meals.   Yes Historical Provider, MD  insulin glargine (LANTUS) 100 UNIT/ML injection Inject 10 Units into the skin at bedtime.    Yes Historical Provider, MD  metoCLOPramide (REGLAN) 10 MG tablet Take 1 tablet (10 mg total) by mouth 3 (three) times daily before meals. Patient not taking: Reported on 12/18/2014 11/11/14   Kristen N Ward, DO  ondansetron (ZOFRAN ODT) 4 MG disintegrating tablet Take 1 tablet (4 mg total) by mouth every 8 (eight) hours as needed for nausea or vomiting. Patient not taking: Reported on 12/18/2014 11/11/14   Kristen N Ward, DO  promethazine (PHENERGAN) 25 MG suppository Place 1 suppository (25 mg total) rectally every 6 (six) hours as needed for nausea or vomiting. Patient not taking: Reported on 12/18/2014 11/11/14   Layla Maw Ward, DO    Allergies:  No Known Allergies  Social History:  Ambulatory   independently   Lives at home  With family     reports that she has been smoking.  She has never used smokeless tobacco. She reports that she does not drink alcohol or use illicit drugs.    Family History: family history includes Diabetes in her mother; Hypertension in her mother.    Physical Exam: Patient Vitals for the past 24 hrs:  BP Temp Pulse Resp SpO2 Height Weight  12/18/14 1930 (!) 211/71 mmHg - 81 18 100 % - -  12/18/14 1915 170/77 mmHg - 84 21 94 % - -  12/18/14 1900 152/69 mmHg - 70 (!) 27 98 % - -  12/18/14 1845 (!) 143/53 mmHg - (!) 53 19 98 % - -  12/18/14 1830 (!) 158/47 mmHg - (!) 56 17 96 % - -  12/18/14 1815 (!) 145/52 mmHg - (!) 59 16 97 % - -  12/18/14 1745 163/76 mmHg - 63 21 97 % - -  12/18/14 1730 171/71 mmHg - 63 19 98 % - -  12/18/14 1715 152/94 mmHg - 72 - 100 % - -  12/18/14 1615 195/82 mmHg - 71 20 100 % - -  12/18/14 1612 190/76 mmHg - 68 20 96 % - -  12/18/14 1610 - - - - - 5\' 7"  (1.702 m) 81.194 kg (179 lb)  12/18/14 1600 184/94 mmHg - 71 - 100 % - -  12/18/14 1545 (!) 201/68 mmHg - 78 - 97 % - -  12/18/14  1530 169/71 mmHg - 73 20 100 % - -  12/18/14 1515 189/74 mmHg - - - - - -  12/18/14 1451 (!) 188/51 mmHg 97.9 F (36.6 C) 77 20 100 % - -  12/18/14 1449 - - - - 99 % - -    1. General:  Patient is tearful shaking 2. Psychological: Alert and  Oriented 3. Head/ENT:     Dry Mucous Membranes                          Head Non traumatic, neck supple  Normal  Dentition 4. SKIN:   decreased Skin turgor,  Skin clean Dry and intact no rash 5. Heart: Regular rate and rhythm no Murmur, Rub or gallop 6. Lungs: Clear to auscultation bilaterally, no wheezes or crackles   7. Abdomen: Soft, non-tender, Non distended 8. Lower extremities: no clubbing, cyanosis, or edema 9. Neurologically Grossly intact, moving all 4 extremities equally 10. MSK: Normal range of motion  body mass index is 28.03 kg/(m^2).   Labs on Admission:   Results for orders placed or performed during the hospital encounter of 12/18/14 (from the past 24 hour(s))  CBG monitoring, ED     Status: Abnormal   Collection Time: 12/18/14  3:04 PM  Result Value Ref Range   Glucose-Capillary 404 (H) 65 - 99 mg/dL  Basic metabolic panel     Status: Abnormal   Collection Time: 12/18/14  3:07 PM  Result Value Ref Range   Sodium 140 135 - 145 mmol/L   Potassium 3.4 (L) 3.5 - 5.1 mmol/L   Chloride 104 101 - 111 mmol/L   CO2 15 (L) 22 - 32 mmol/L   Glucose, Bld 438 (H) 65 - 99 mg/dL   BUN 62 (H) 6 - 20 mg/dL   Creatinine, Ser 4.13 (H) 0.44 - 1.00 mg/dL   Calcium 9.6 8.9 - 24.4 mg/dL   GFR calc non Af Amer 9 (L) >60 mL/min   GFR calc Af Amer 10 (L) >60 mL/min   Anion gap 21 (H) 5 - 15  CBC     Status: Abnormal   Collection Time: 12/18/14  3:07 PM  Result Value Ref Range   WBC 15.3 (H) 4.0 - 10.5 K/uL   RBC 3.18 (L) 3.87 - 5.11 MIL/uL   Hemoglobin 9.0 (L) 12.0 - 15.0 g/dL   HCT 01.0 (L) 27.2 - 53.6 %   MCV 86.8 78.0 - 100.0 fL   MCH 28.3 26.0 - 34.0 pg   MCHC 32.6 30.0 - 36.0 g/dL   RDW 64.4 03.4 - 74.2 %     Platelets 215 150 - 400 K/uL  I-Stat Beta hCG blood, ED ([facility], [facility], [facility] only)     Status: Abnormal   Collection Time: 12/18/14  3:12 PM  Result Value Ref Range   I-stat hCG, quantitative 5.8 (H) <5 mIU/mL   Comment 3          hCG, quantitative, pregnancy     Status: None   Collection Time: 12/18/14  3:45 PM  Result Value Ref Range   hCG, Beta Chain, Quant, S 1 <5 mIU/mL  Blood gas, venous     Status: Abnormal   Collection Time: 12/18/14  4:15 PM  Result Value Ref Range   pH, Ven 7.443 (H) 7.250 - 7.300   pCO2, Ven 24.1 (L) 45.0 - 50.0 mmHg   pO2, Ven 81.2 (H) 30.0 - 45.0 mmHg   Bicarbonate 16.2 (L) 20.0 - 24.0 mEq/L   TCO2 15.1 0 - 100 mmol/L   Acid-base deficit 6.4 (H) 0.0 - 2.0 mmol/L   O2 Saturation 95.7 %   Patient temperature 98.6    Collection site VENOUS    Drawn by COLLECTED BY LABORATORY    Sample type VENOUS   CBG monitoring, ED     Status: Abnormal   Collection Time: 12/18/14  4:22 PM  Result Value Ref Range   Glucose-Capillary 286 (H) 65 - 99 mg/dL  Urinalysis, Routine w reflex microscopic (not at [facility])     Status: Abnormal   Collection  Time: 12/18/14  4:57 PM  Result Value Ref Range   Color, Urine YELLOW YELLOW   APPearance CLOUDY (A) CLEAR   Specific Gravity, Urine 1.015 1.005 - 1.030   pH 5.0 5.0 - 8.0   Glucose, UA 500 (A) NEGATIVE mg/dL   Hgb urine dipstick NEGATIVE NEGATIVE   Bilirubin Urine NEGATIVE NEGATIVE   Ketones, ur NEGATIVE NEGATIVE mg/dL   Protein, ur >409>300 (A) NEGATIVE mg/dL   Nitrite NEGATIVE NEGATIVE   Leukocytes, UA NEGATIVE NEGATIVE  Urine microscopic-add on     Status: Abnormal   Collection Time: 12/18/14  4:57 PM  Result Value Ref Range   Squamous Epithelial / LPF 0-5 (A) NONE SEEN   WBC, UA 0-5 0 - 5 WBC/hpf   RBC / HPF 0-5 0 - 5 RBC/hpf   Bacteria, UA FEW (A) NONE SEEN   Urine-Other AMORPHOUS URATES/PHOSPHATES   CBG monitoring, ED     Status: Abnormal   Collection Time: 12/18/14  6:12 PM  Result Value Ref Range   Glucose-Capillary  161 (H) 65 - 99 mg/dL    UA no evidence of infection protein level above 300  Lab Results  Component Value Date   HGBA1C 7.3* 09/08/2014    Estimated Creatinine Clearance: 14.8 mL/min (by C-G formula based on Cr of 5.32).  BNP (last 3 results) No results for input(s): PROBNP in the last 8760 hours.  Other results:  I have pearsonaly reviewed this: ECG REPORT No obtained    Filed Weights   12/18/14 1610  Weight: 81.194 kg (179 lb)     Cultures:    Component Value Date/Time   SDES BLOOD RIGHT HAND 09/08/2014 2140   SPECREQUEST BOTTLES DRAWN AEROBIC ONLY 5CC 09/08/2014 2140   CULT  09/08/2014 2140    NO GROWTH 5 DAYS [facility]    REPTSTATUS 09/13/2014 FINAL 09/08/2014 2140     Radiological Exams on Admission: No results found.  Chart has been reviewed  Family not  at  Bedside    Assessment/Plan  44 year old female history of chronic kidney disease presents with acute on chronic renal failure and metabolic acidosis  Present on Admission:  . Metabolic acidosis - admitted on telemetry continue bicarbonate is per nephrology. Monitor electrolytes  . Acute renal failure (ARF) (HCC) Will attempt to rehydrate to see if this improves slightly. The base of patient's creatinine is 4.6 nephrology is aware  . HTN (hypertension) restart clonidine and hydralazine  . Current smoker recommended cessation of smoking ordered tobacco cessation protocol indicative  . CKD (chronic kidney disease) nephrology is aware we will follow up in the morning. Patient may eventually need dialysis. He secondary to diabetes and hypertension  . Anemia will obtain anemia panel  . SIRS (systemic inflammatory response syndrome) (HCC) initiate sepsis protocol obtain blood cultures given severity of metabolic acidosis and acute renal failure initiate antibiotics if no evidence of infection will discontinue. Will obtain chest x-ray  . Dehydration obtain orthostatics and give IV  fluids    Prophylaxis:   Lovenox   CODE STATUS:  FULL CODE   as per patient    Disposition:  To home once workup is complete and patient is stable  Other plan as per orders.  I have spent a total of 55 min on this admission  Elna Radovich 12/18/2014, 7:47 PM  [facility]  Pager [phone]   after 2 AM please page floor coverage PA If 7AM-7PM, please contact the day team taking care of the patient  [email or web address]  Password [email or web address]

## 2014-12-18 NOTE — Progress Notes (Signed)
Patient newly admitted to the floor. Patient wanted to take a shower, staff encouraged patient to let us assist with her care since she is actively nauseous and vomiting. Patient refused assistance and went against medical advise and went to the bathroom to take a shower independently. This Clinical research associatewriter checked on the patient frequently. Patient was supervised the whole time during her shower. Patient was encouraged again to let staff assist with bathing and that it can be safely done in the bed or at bedside. Will continue to monitor.

## 2014-12-18 NOTE — ED Notes (Addendum)
Patty Dowd attempting IV at present time; 2nd line for insulin.

## 2014-12-18 NOTE — ED Notes (Signed)
Crystal PikesSusan attempting IV with US assistance.

## 2014-12-18 NOTE — ED Notes (Signed)
Per EMS pt complaint of emesis related to hypoglycemia.

## 2014-12-18 NOTE — ED Notes (Signed)
Pt prep for transport room 1412-1 admitting physician into see and transport delayed

## 2014-12-18 NOTE — ED Notes (Signed)
Bed: [id number] Expected date:  Expected time:  Means of arrival:  Comments: N/V hyperglycemic

## 2014-12-18 NOTE — ED Notes (Signed)
Spoke to MD Schlossman to clarify the order for insulin drip. She was made aware that patient's CBG was 286 time 1621 after 1 L NS. MD reports to start drip with dextrose.

## 2014-12-18 NOTE — Progress Notes (Signed)
ANTIBIOTIC CONSULT NOTE - INITIAL  Pharmacy Consult for Vancomycin & Zosyn Indication: Sepsis  No Known Allergies  Patient Measurements: Height: 5\' 7"  (170.2 cm) Weight: 162 lb 3.2 oz (73.573 kg) IBW/kg (Calculated) : 61.6  Vital Signs: Temp: 100.7 F (38.2 C) (12/17 2030) Temp Source: Oral (12/17 2030) BP: 171/61 mmHg (12/17 2030) Pulse Rate: 73 (12/17 2030) Intake/Output from previous day:   Intake/Output from this shift:    Labs:  Recent Labs  12/18/14 1507  WBC 15.3*  HGB 9.0*  PLT 215  CREATININE 5.32*   Estimated Creatinine Clearance: 13.1 mL/min (by C-G formula based on Cr of 5.32). No results for input(s): VANCOTROUGH, VANCOPEAK, VANCORANDOM, GENTTROUGH, GENTPEAK, GENTRANDOM, TOBRATROUGH, TOBRAPEAK, TOBRARND, AMIKACINPEAK, AMIKACINTROU, AMIKACIN in the last 72 hours.   Microbiology: No results found for this or any previous visit (from the past 720 hour(s)).  Medical History: Past Medical History  Diagnosis Date  . Hypertension   . Diabetes mellitus without complication (HCC) 1991  . Gastritis   . Chronic kidney disease   . Acute kidney injury (HCC)   . Anemia   . Renal cyst, left     Medications:  Scheduled:  . cloNIDine  0.3 mg Oral BID  . [START ON 12/19/2014] diltiazem  360 mg Oral Daily  . enoxaparin (LOVENOX) injection  30 mg Subcutaneous Q24H  . hydrALAZINE  100 mg Oral TID  . insulin aspart  0-9 Units Subcutaneous 6 times per day  . insulin glargine  5 Units Subcutaneous QHS  . nicotine  21 mg Transdermal Daily  . piperacillin-tazobactam (ZOSYN)  IV  2.25 g Intravenous Q8H  . sodium chloride  1,000 mL Intravenous Q1H   Followed by  . sodium chloride  500 mL Intravenous Q1H  . sodium chloride  3 mL Intravenous Q12H  . vancomycin  1,000 mg Intravenous Q48H   Infusions:  . sodium chloride    .  sodium bicarbonate 150 mEq in sterile water 1000 mL infusion     Assessment:  [age] yr female with PMH including DM, HTN, CKD, AKI  Presents  with c/o nausea & vomiting.  Being admitted for acute vs chronic renal failure, metabolic acidosis, SIRS  Pharmacy consulted to dose Vancomycin & Zosyn for sepsis  CrCl ~ 13 ml/min  12/17 >>Vanc >> 12/17 >>Zosyn >>    21/17 blood:  Trough/Dose change info:   Goal of Therapy:  Vancomycin trough level 15-20 mcg/ml  Plan:  Measure antibiotic drug levels at steady state Follow up culture results  Zosyn 2.25gm IV q8h Vancomycin 1gm IV q48h Follow renal function  Momoko Slezak, Joselyn GlassmanLeann Trefz, PharmD 12/18/2014,9:32 PM

## 2014-12-18 NOTE — Progress Notes (Signed)
CRITICAL VALUE ALERT  Critical value received:  Lactic Acid 2.6  Date of notification:  12/18/2014  Time of notification:  2257  Critical value read back:Yes.    Nurse who received alert:  Judd Gaudierristy Lauraine Crespo, RN   MD notified (1st page):  Adela Glimpseoutova, MD  Time of first page:  2259  MD notified (2nd page):  Time of second page:  Responding MD: Adela Glimpseoutova, MD  Time MD responded:  2303

## 2014-12-18 NOTE — Progress Notes (Signed)
Patient went against medical advice again and got into the shower after education and encouragement from multiple staff members. MD made aware of the situation. Pt was frequently checked on during shower and staff member was in the room while she showered. Will continue to monitor.

## 2014-12-18 NOTE — ED Provider Notes (Signed)
CSN: [id number]     Arrival date & time 12/18/14  1444 History   First MD Initiated Contact with Patient 12/18/14 1459     Chief Complaint  Patient presents with  . Emesis  . Hyperglycemia     (Consider location/radiation/quality/duration/timing/severity/associated sxs/prior Treatment) Patient is a 44 y.o. female presenting with vomiting and hyperglycemia. The history is limited by a developmental delay.  Emesis Severity:  Severe Duration:  9 hours Timing:  Constant Quality:  Stomach contents Relieved by:  Nothing Worsened by:  Nothing tried Ineffective treatments:  None tried Associated symptoms: no abdominal pain, no cough, no diarrhea, no fever (hot and cold), no headaches and no sore throat   Risk factors: diabetes   Hyperglycemia Associated symptoms: fatigue, increased thirst, nausea, polyuria and vomiting   Associated symptoms: no abdominal pain, no chest pain, no dysuria, no fever and no shortness of breath     Past Medical History  Diagnosis Date  . Hypertension   . Diabetes mellitus without complication (HCC) 1991  . Gastritis   . Chronic kidney disease   . Acute kidney injury (HCC)   . Anemia   . Renal cyst, left    History reviewed. No pertinent past surgical history. Family History  Problem Relation Age of Onset  . Hypertension Mother   . Diabetes Mother    Social History  Substance Use Topics  . Smoking status: Current Every Day Smoker  . Smokeless tobacco: Never Used  . Alcohol Use: No     Comment: less than a ppd, "not much"   OB History    Gravida Para Term Preterm AB TAB SAB Ectopic Multiple Living       Review of Systems  Constitutional: Positive for fatigue. Negative for fever.  HENT: Negative for sore throat.   Eyes: Negative for visual disturbance.  Respiratory: Negative for cough and shortness of breath.   Cardiovascular: Negative for chest pain.  Gastrointestinal: Positive for nausea and vomiting. Negative for  abdominal pain, diarrhea and constipation (was constipated, now having 7 normal BM today).  Endocrine: Positive for polydipsia and polyuria.  Genitourinary: Negative for dysuria and difficulty urinating.  Musculoskeletal: Negative for back pain and neck pain.  Skin: Negative for rash.  Neurological: Negative for syncope and headaches.      Allergies  Review of patient's allergies indicates no known allergies.  Home Medications   Prior to Admission medications   Medication Sig Start Date End Date Taking? Authorizing Provider  cloNIDine (CATAPRES) 0.3 MG tablet Take 1 tablet (0.3 mg total) by mouth 2 (two) times daily. 10/01/14  Yes Josalyn Funches, MD  diltiazem (CARDIZEM CD) 180 MG 24 hr capsule Take 2 capsules (360 mg total) by mouth daily. 10/01/14  Yes Josalyn Funches, MD  furosemide (LASIX) 40 MG tablet Take 1 tablet (40 mg total) by mouth daily as needed for fluid or edema. 10/01/14   Josalyn Funches, MD  hydrALAZINE (APRESOLINE) 100 MG tablet Take 1 tablet (100 mg total) by mouth 3 (three) times daily. 10/01/14   Josalyn Funches, MD  insulin aspart (NOVOLOG FLEXPEN) 100 UNIT/ML FlexPen Inject 1-10 Units into the skin 3 (three) times daily with meals.    Historical Provider, MD  insulin glargine (LANTUS) 100 UNIT/ML injection Inject 14 Units into the skin at bedtime.     Historical Provider, MD  metoCLOPramide (REGLAN) 10 MG tablet Take 1 tablet (10 mg total) by mouth 3 (three) times daily before  meals. 11/11/14   Kristen N Ward, DO  ondansetron (ZOFRAN ODT) 4 MG disintegrating tablet Take 1 tablet (4 mg total) by mouth every 8 (eight) hours as needed for nausea or vomiting. 11/11/14   Kristen N Ward, DO  promethazine (PHENERGAN) 25 MG suppository Place 1 suppository (25 mg total) rectally every 6 (six) hours as needed for nausea or vomiting. 11/11/14   Kristen N Ward, DO  Vitamin D, Ergocalciferol, (DRISDOL) 50000 UNITS CAPS capsule Take 50,000 Units by mouth every 7 (seven) days.     Historical Provider, MD   BP 188/51 mmHg  Pulse 77  Temp(Src) 97.9 F (36.6 C)  Resp 20  SpO2 100% Physical Exam  Constitutional: She is oriented to person, place, and time. She appears well-developed and well-nourished. She appears ill. No distress.  HENT:  Head: Normocephalic and atraumatic.  Eyes: Conjunctivae and EOM are normal.  Neck: Normal range of motion.  Cardiovascular: Normal rate, regular rhythm, normal heart sounds and intact distal pulses.  Exam reveals no gallop and no friction rub.   No murmur heard. Pulmonary/Chest: Effort normal and breath sounds normal. No respiratory distress. She has no wheezes. She has no rales.  Abdominal: Soft. She exhibits no distension. There is no tenderness. There is no guarding.  Musculoskeletal: She exhibits no edema or tenderness.  Neurological: She is alert and oriented to person, place, and time.  Skin: Skin is warm and dry. No rash noted. She is not diaphoretic. No erythema.  Nursing note and vitals reviewed.   ED Course  Procedures (including critical care time) Labs Review Labs Reviewed  CBG MONITORING, ED - Abnormal; Notable for the following:    Glucose-Capillary 404 (*)    All other components within normal limits  BASIC METABOLIC PANEL  CBC  URINALYSIS, ROUTINE W REFLEX MICROSCOPIC (NOT AT [facility])  I-STAT BETA HCG BLOOD, ED ([facility], [facility], [facility] ONLY)  I-STAT VENOUS BLOOD GAS, ED    Imaging Review No results found. I have personally reviewed and evaluated these images and lab results as part of my medical decision-making.   EKG Interpretation None      CRITICAL CARE: AG metabolic acidosis Performed by: Rhae Lerner   Total critical care time: 30 minutes  Critical care time was exclusive of separately billable procedures and treating other patients.  Critical care was necessary to treat or prevent imminent or life-threatening deterioration.  Critical care was time spent personally by me on the following  activities: development of treatment plan with patient and/or surrogate as well as nursing, discussions with consultants, evaluation of patient's response to treatment, examination of patient, obtaining history from patient or surrogate, ordering and performing treatments and interventions, ordering and review of laboratory studies, ordering and review of radiographic studies, pulse oximetry and re-evaluation of patient's condition.  MDM   Final diagnoses:  None   44 year old female with a history of diabetes, hypertension, CK D stage IV presents with concern for nausea and vomiting. Pt hyperglycemic to 404 on arrival to the emergency department. Additional labs show patient's glucose elevated to 438, with a bicarbonate of 15 and anion gap of 21, with BUN of 62 and creatinine of 5.32.  Etiology of patient's anion gap metabolic acidosis could be from lactic acid, renal failure or possible DKA. Given patient's hyperglycemia, we'll initiate insulin drip and treat for likely DKA as we await urinalysis. Patient is given potassium replacement. Given zofran, IVF.   Abdominal exam benign, doubt intraabdominal process, no sign of UTI.  Urinalysis  shows no ketones, with concern for metabolic acidosis secondary to renal failure with n/v  Also possibly secondary to renal failure. Discontinued insulin gtt.  Discussed with nephrology who recommends bicarb gtt. Will admit to stepdown unit for further care.  Alvira Monday`  Maleeya Peterkin, MD 12/19/14 [phone]

## 2014-12-19 ENCOUNTER — Inpatient Hospital Stay (HOSPITAL_COMMUNITY): Payer: Medicare (Managed Care)

## 2014-12-19 DIAGNOSIS — N184 Chronic kidney disease, stage 4 (severe): Secondary | ICD-10-CM | POA: Diagnosis not present

## 2014-12-19 DIAGNOSIS — N179 Acute kidney failure, unspecified: Secondary | ICD-10-CM | POA: Diagnosis not present

## 2014-12-19 DIAGNOSIS — E872 Acidosis: Secondary | ICD-10-CM | POA: Diagnosis not present

## 2014-12-19 DIAGNOSIS — E860 Dehydration: Secondary | ICD-10-CM | POA: Diagnosis not present

## 2014-12-19 DIAGNOSIS — I10 Essential (primary) hypertension: Secondary | ICD-10-CM

## 2014-12-19 DIAGNOSIS — Z720 Tobacco use: Secondary | ICD-10-CM | POA: Diagnosis not present

## 2014-12-19 LAB — GLUCOSE, CAPILLARY
GLUCOSE-CAPILLARY: 102 mg/dL — AB (ref 65–99)
GLUCOSE-CAPILLARY: 137 mg/dL — AB (ref 65–99)
GLUCOSE-CAPILLARY: 153 mg/dL — AB (ref 65–99)
Glucose-Capillary: 149 mg/dL — ABNORMAL HIGH (ref 65–99)
Glucose-Capillary: 182 mg/dL — ABNORMAL HIGH (ref 65–99)
Glucose-Capillary: 224 mg/dL — ABNORMAL HIGH (ref 65–99)

## 2014-12-19 LAB — COMPREHENSIVE METABOLIC PANEL
ALT: 9 U/L — AB (ref 14–54)
AST: 15 U/L (ref 15–41)
Albumin: 3.8 g/dL (ref 3.5–5.0)
Alkaline Phosphatase: 77 U/L (ref 38–126)
Anion gap: 12 (ref 5–15)
BUN: 57 mg/dL — AB (ref 6–20)
CALCIUM: 8.5 mg/dL — AB (ref 8.9–10.3)
CHLORIDE: 111 mmol/L (ref 101–111)
CO2: 21 mmol/L — AB (ref 22–32)
CREATININE: 5.09 mg/dL — AB (ref 0.44–1.00)
GFR calc Af Amer: 11 mL/min — ABNORMAL LOW (ref >60)
GFR calc non Af Amer: 9 mL/min — ABNORMAL LOW (ref >60)
GLUCOSE: 166 mg/dL — AB (ref 65–99)
Potassium: 3.9 mmol/L (ref 3.5–5.1)
SODIUM: 144 mmol/L (ref 135–145)
TOTAL PROTEIN: 6.5 g/dL (ref 6.5–8.1)
Total Bilirubin: 0.6 mg/dL (ref 0.3–1.2)

## 2014-12-19 LAB — CBC
HCT: 22.8 % — ABNORMAL LOW (ref 36.0–46.0)
HEMOGLOBIN: 7.5 g/dL — AB (ref 12.0–15.0)
MCH: 28.5 pg (ref 26.0–34.0)
MCHC: 32.9 g/dL (ref 30.0–36.0)
MCV: 86.7 fL (ref 78.0–100.0)
Platelets: 158 10*3/uL (ref 150–400)
RBC: 2.63 MIL/uL — AB (ref 3.87–5.11)
RDW: 13.5 % (ref 11.5–15.5)
WBC: 9.7 10*3/uL (ref 4.0–10.5)

## 2014-12-19 LAB — RETICULOCYTES
RBC.: 2.63 MIL/uL — ABNORMAL LOW (ref 3.87–5.11)
Retic Count, Absolute: 26.3 10*3/uL (ref 19.0–186.0)
Retic Ct Pct: 1 % (ref 0.4–3.1)

## 2014-12-19 LAB — TROPONIN I
Troponin I: 0.06 ng/mL — ABNORMAL HIGH (ref <0.031)
Troponin I: 0.04 ng/mL — ABNORMAL HIGH (ref <0.031)

## 2014-12-19 LAB — LACTIC ACID, PLASMA: Lactic Acid, Venous: 0.8 mmol/L (ref 0.5–2.0)

## 2014-12-19 LAB — VITAMIN B12: VITAMIN B 12: 360 pg/mL (ref 180–914)

## 2014-12-19 LAB — FERRITIN: Ferritin: 23 ng/mL (ref 11–307)

## 2014-12-19 LAB — IRON AND TIBC
Iron: 30 ug/dL (ref 28–170)
SATURATION RATIOS: 10 % — AB (ref 10.4–31.8)
TIBC: 305 ug/dL (ref 250–450)
UIBC: 275 ug/dL

## 2014-12-19 LAB — FOLATE: FOLATE: 13.4 ng/mL (ref >5.9)

## 2014-12-19 LAB — PHOSPHORUS: PHOSPHORUS: 2.5 mg/dL (ref 2.5–4.6)

## 2014-12-19 LAB — MAGNESIUM: MAGNESIUM: 2 mg/dL (ref 1.7–2.4)

## 2014-12-19 MED ORDER — WHITE PETROLATUM GEL
Status: DC | PRN
  Filled 2014-12-19: qty 28.35

## 2014-12-19 MED ORDER — DOCUSATE SODIUM 50 MG PO CAPS
50.0000 mg | ORAL_CAPSULE | Freq: Two times a day (BID) | ORAL | Status: DC
  Filled 2014-12-19 (×3): qty 1

## 2014-12-19 MED ORDER — POLYETHYLENE GLYCOL 3350 17 G PO PACK
17.0000 g | PACK | Freq: Every day | ORAL | Status: DC
  Administered 2014-12-19: 17 g via ORAL
  Filled 2014-12-19 (×2): qty 1

## 2014-12-19 MED ORDER — METOCLOPRAMIDE HCL 5 MG PO TABS
5.0000 mg | ORAL_TABLET | Freq: Three times a day (TID) | ORAL | Status: DC
  Filled 2014-12-19 (×5): qty 1

## 2014-12-19 MED ORDER — INSULIN GLARGINE 100 UNIT/ML ~~LOC~~ SOLN
7.0000 [IU] | Freq: Every day | SUBCUTANEOUS | Status: DC
  Administered 2014-12-19: 7 [IU] via SUBCUTANEOUS
  Filled 2014-12-19 (×2): qty 0.07

## 2014-12-19 NOTE — Progress Notes (Signed)
Notified Dr. Gwenlyn PerkingMadera of patient's elevated BP in the 180-190s, scheduled BP med given, will continue to assess patient.

## 2014-12-19 NOTE — Progress Notes (Addendum)
[facility] PROGRESS NOTE  Genelle BalCrystal Conwell WUJ:[id number] DOB: 20-Apr-1970 DOA: 12/18/2014 PCP: No primary care provider on file.  Assessment/Plan: 1-acute on chronic renal failure: Appears to be secondary to prerenal azotemia with ongoing nausea and vomiting prior to admission. Patient also endorses taking too much Lasix to get rid of extra fluid buildup -No Lasix for now -Patient had responded well to bicarbonate drip -Creatinine is trending down appropriately and no significant fluid overload appreciate on exam -Will adjust bicarbonate drip rate to 75 mL/h and advance her diet -Will follow clinical response and recommendation from renal service  2-gastroparesis: After discussing with patient well documented gastroparesis process which has been already evaluated and confirmed while [place] was [place] -Will resume Reglan (patient has been taking these intermittently) -Will control her diabetes better  3-type 2 diabetes mellitus with hyperglycemia and chronic kidney disease -Will continue sliding scale insulin and Lantus (last one adjusted to 7 units daily) -Continue low carbohydrates diet -Will check A1c  4-essential hypertension: Fairly well control -Will continue current antihypertensive regimen  5-tobacco abuse: Cessation counseling has been provided -Will continue nicotine patch  6-chronic constipation: Will initiate therapy with Colace and miralax  7-sirs/metabolic acidosis: Appears to be secondary to dehydration with acute on chronic renal failure -No sinus of infection has been demonstrated -Will discontinue antibiotic therapy  Code Status: Full code Family Communication: No family at bedside Disposition Plan: Home in the next 24-48 hours   Consultants:  Renal service was consulted (no further recommendations for now. They're recommended to continue bicarbonate drip weaning patient off and after that start oral bicarbonate. She has an appointment set up to  establish care as an outpatient as soon as she can pay the co-pay).  Procedures: See below for x-ray reports  Antibiotics:  Vancomycin and Zosyn 1 day  HPI/Subjective: Feeling better, no nausea/vomiting, denies chest pain, shortness of breath or any other acute complaints. Patient appetite is back and she is tolerating fluid intake.  Objective: Filed Vitals:   12/19/14 1400 12/19/14 1740  BP: 135/57 147/57  Pulse: 54   Temp: 98.6 F (37 C)   Resp: 20     Intake/Output Summary (Last 24 hours) at 12/19/14 1830 Last data filed at 12/19/14 1629  Gross per 24 hour  Intake 2711.66 ml  Output      0 ml  Net 2711.66 ml   Filed Weights   12/18/14 1610 12/18/14 2030  Weight: 81.194 kg (179 lb) 73.573 kg (162 lb 3.2 oz)    Exam:   General:  Afebrile, reports no nausea or vomiting at this moment. Patient endorses that after having multiple bowel movements right prior to come to the hospital her appetite is now back and she is feeling much better.  Cardiovascular: S1 and S2, no rubs, no gallops  Respiratory: Good air movement, clear to auscultation bilaterally  Abdomen: Positive bowel sounds, soft, no guarding  Musculoskeletal: Trace edema bilaterally.  Data Reviewed: Basic Metabolic Panel:  Recent Labs Lab 12/18/14 1507 12/18/14 2145 12/19/14 0217  NA 140 142 144  K 3.4* 4.1 3.9  CL 104 106 111  CO2 15* 16* 21*  GLUCOSE 438* 316* 166*  BUN 62* 61* 57*  CREATININE 5.32* 5.43* 5.09*  CALCIUM 9.6 9.4 8.5*  MG  --   --  2.0  PHOS  --   --  2.5   Liver Function Tests:  Recent Labs Lab 12/18/14 2145 12/19/14 0217  AST 17 15  ALT 10* 9*  ALKPHOS 98  77  BILITOT 1.1 0.6  PROT 8.0 6.5  ALBUMIN 4.8 3.8   CBC:  Recent Labs Lab 12/18/14 1507 12/18/14 2145 12/19/14 0217  WBC 15.3* 11.7* 9.7  NEUTROABS  --  10.8*  --   HGB 9.0* 8.9* 7.5*  HCT 27.6* 27.1* 22.8*  MCV 86.8 87.1 86.7  PLT 215 225 158   Cardiac Enzymes:  Recent Labs Lab 12/18/14 2145  12/19/14 0217 12/19/14 0851  TROPONINI 0.05* 0.06* 0.04*   CBG:  Recent Labs Lab 12/19/14 0108 12/19/14 0441 12/19/14 0757 12/19/14 1216 12/19/14 1630  GLUCAP 149* 102* 137* 153* 182*    Recent Results (from the past 240 hour(s))  Culture, blood (x 2)     Status: None (Preliminary result)   Collection Time: 12/18/14  9:50 PM  Result Value Ref Range Status   Specimen Description BLOOD RIGHT HAND  Final   Special Requests   Final    IN PEDIATRIC BOTTLE 2CC Performed at [facility]    Culture PENDING  Incomplete   Report Status PENDING  Incomplete     Studies: Dg Chest Port 1 View  12/19/2014  CLINICAL DATA:  Sepsis EXAM: PORTABLE CHEST - 1 VIEW COMPARISON:  None. FINDINGS: Cardiac shadow is enlarged. The lungs are well aerated bilaterally. No focal infiltrate or sizable effusion is seen. No bony abnormality is noted. IMPRESSION: No acute Electronically Signed   By: Alcide Clever M.D.   On: 12/19/2014 08:37   Dg Abd Portable 1v  12/19/2014  CLINICAL DATA:  Nausea and vomiting. EXAM: PORTABLE ABDOMEN - 1 VIEW COMPARISON:  None. FINDINGS: Gas-filled loops of colon are present. No disproportionate dilatation of bowel. Artifact from overlying objects limits the examination. No obvious free intraperitoneal gas. IMPRESSION: Nonobstructive bowel gas pattern. Electronically Signed   By: Jolaine Click M.D.   On: 12/19/2014 08:35    Scheduled Meds: . cloNIDine  0.3 mg Oral BID  . diltiazem  360 mg Oral Daily  . docusate sodium  50 mg Oral BID  . enoxaparin (LOVENOX) injection  30 mg Subcutaneous Q24H  . hydrALAZINE  100 mg Oral TID  . insulin aspart  0-9 Units Subcutaneous 6 times per day  . insulin glargine  7 Units Subcutaneous QHS  . metoCLOPramide  5 mg Oral TID AC & HS  . nicotine  21 mg Transdermal Daily  . piperacillin-tazobactam (ZOSYN)  IV  2.25 g Intravenous Q8H  . polyethylene glycol  17 g Oral Daily  . sodium chloride  3 mL Intravenous Q12H   Continuous  Infusions: .  sodium bicarbonate 150 mEq in sterile water 1000 mL infusion 100 mL/hr at 12/19/14 1742    Active Problems:   CKD (chronic kidney disease)   Diabetes mellitus (HCC)   HTN (hypertension)   Anemia   Current smoker   Metabolic acidosis   Acute renal failure (ARF) (HCC)   SIRS (systemic inflammatory response syndrome) (HCC)   Sepsis (HCC)   Dehydration   Nausea and vomiting    Time spent: 30 minutes    Vassie Loll  [facility] Pager [phone]. If 7PM-7AM, please contact night-coverage at [email or web address], password [facility] 12/19/2014, 6:30 PM  LOS: 1 day

## 2014-12-19 NOTE — Progress Notes (Signed)
Saw and talked with Ms Crystal Tanner, chart reviewed.  She has known CKD f/b kidney doctor in [place].  She moved to [place] within the past 6 mos.  [facility] for [facility] renal MD here but couldn't afford to copay/ fees.  She was admitted with nausea which she relates to either her menstrual cycle and/ or her chronic constipation.  Her n/v is resolved now that she had multiple bowel movements yesterday and her appetite is back and good today.  Her creat is 5 and she has stage 4 CKD prob from DM/ HTN.  She is not grossly uremic. She may be having some early symptoms (fatigue) but no indication for dialysis at this time. She knows to call [facility] when she can afford the initial visit.  No formal consult done.  She does have an "orange card" and has been receiving health care with this program.  She applied to Medicaid twice and was turned down.  They said she should apply for disability then she could get Medicaid, but she doesn't want to apply for disability as she considers herself still able to work.  No suggestions at this time. Call w questions. OK for dc from renal standpoint.    Vinson Moselleob Naija Troost MD [facility] pager [phone]    cell [phone] 12/19/2014, 12:20 PM

## 2014-12-20 ENCOUNTER — Telehealth: Payer: Self-pay

## 2014-12-20 DIAGNOSIS — Z720 Tobacco use: Secondary | ICD-10-CM | POA: Diagnosis not present

## 2014-12-20 DIAGNOSIS — N184 Chronic kidney disease, stage 4 (severe): Secondary | ICD-10-CM | POA: Diagnosis not present

## 2014-12-20 DIAGNOSIS — R112 Nausea with vomiting, unspecified: Secondary | ICD-10-CM

## 2014-12-20 DIAGNOSIS — E872 Acidosis: Secondary | ICD-10-CM | POA: Diagnosis not present

## 2014-12-20 DIAGNOSIS — N179 Acute kidney failure, unspecified: Secondary | ICD-10-CM | POA: Diagnosis not present

## 2014-12-20 LAB — GLUCOSE, CAPILLARY
GLUCOSE-CAPILLARY: 120 mg/dL — AB (ref 65–99)
GLUCOSE-CAPILLARY: 136 mg/dL — AB (ref 65–99)
Glucose-Capillary: 61 mg/dL — ABNORMAL LOW (ref 65–99)
Glucose-Capillary: 65 mg/dL (ref 65–99)
Glucose-Capillary: 125 mg/dL — ABNORMAL HIGH (ref 65–99)
Glucose-Capillary: 123 mg/dL — ABNORMAL HIGH (ref 65–99)

## 2014-12-20 LAB — HEMOGLOBIN A1C
HEMOGLOBIN A1C: 6.9 % — AB (ref 4.8–5.6)
MEAN PLASMA GLUCOSE: 151 mg/dL

## 2014-12-20 MED ORDER — METOCLOPRAMIDE HCL 5 MG PO TABS: 5.0000 mg | ORAL_TABLET | Freq: Three times a day (TID) | ORAL | Status: DC

## 2014-12-20 MED ORDER — INSULIN GLARGINE 100 UNIT/ML ~~LOC~~ SOLN: 8.0000 [IU] | Freq: Every day | SUBCUTANEOUS | Status: DC

## 2014-12-20 MED ORDER — DOCUSATE SODIUM 50 MG PO CAPS: 50.0000 mg | ORAL_CAPSULE | Freq: Two times a day (BID) | ORAL | Status: DC

## 2014-12-20 MED ORDER — POLYETHYLENE GLYCOL 3350 17 G PO PACK: 17.0000 g | PACK | Freq: Every day | ORAL | Status: DC

## 2014-12-20 MED ORDER — NICOTINE 21 MG/24HR TD PT24: 21.0000 mg | MEDICATED_PATCH | Freq: Every day | TRANSDERMAL | Status: DC

## 2014-12-20 MED ORDER — SODIUM BICARBONATE 650 MG PO TABS: 650.0000 mg | ORAL_TABLET | Freq: Two times a day (BID) | ORAL | Status: DC

## 2014-12-20 NOTE — Care Management Note (Signed)
Case Management Note  Patient Details  Name: Crystal Tanner MRN: [id number] Date of Birth: 1970-08-08  Subjective/Objective:   44 y/o f admitted w/Renal failure. Recent move from [place] to [place].  TC admitting to confirm if patient has health insurance-per admitting-patient did not bring in insurance card for verification.  Patient states she had [employer] but she has terminated that insurance in October 2016.  Also listed is [place] of [place]-informed aptient that medicaid of of state is different from medicaid here in [place]-she must go to DSS to appy for medicaid here in [place]-patient voiced understanding.  [facility]. Per [facility] liason-[facility], she has been seen by the financial counselor.  Patient will [facility] to [facility] for services,specialist.                 Action/Plan:d/c plan home.   Expected Discharge Date:                  Expected Discharge Plan:  Home/Self Care  In-House Referral:     Discharge planning Services  CM Consult, Indigent Health Clinic  Post Acute Care Choice:    Choice offered to:     DME Arranged:    DME Agency:     HH Arranged:    HH Agency:     Status of Service:  Completed, signed off  Medicare Important Message Given:    Date Medicare IM Given:    Medicare IM give by:    Date Additional Medicare IM Given:    Additional Medicare Important Message give by:     If discussed at Long Length of Stay Meetings, dates discussed:    Additional Comments:  Lanier ClamMahabir, Zakariah Urwin, RN 12/20/2014, 2:44 PM

## 2014-12-20 NOTE — Discharge Summary (Signed)
Physician Discharge Summary  Aaralyn Kil ZOX:[id number] DOB: [birth year] DOA: 12/18/2014  PCP: Lora Paula, MD  Admit date: 12/18/2014 Discharge date: 12/20/2014  Time spent: 35 minutes  Recommendations for Outpatient Follow-up:  Repeat BMET to follow electrolytes and renal function Follow BP and adjust antihypertensive regimen as needed Needs outpatient follow up with renal service   Discharge Diagnoses:  Active Problems:   CKD (chronic kidney disease)   Diabetes mellitus (HCC)   HTN (hypertension)   Anemia   Current smoker   Metabolic acidosis   Acute renal failure (ARF) (HCC)   SIRS (systemic inflammatory response syndrome) (HCC)   Sepsis (HCC)   Dehydration   Nausea and vomiting   Discharge Condition: stable and improved. Will discharge home and she will follow up with PCP on 01/07/15  Diet recommendation: low sodium diet   Filed Weights   12/18/14 1610 12/18/14 2030  Weight: 81.194 kg (179 lb) 73.573 kg (162 lb 3.2 oz)    History of present illness:  44 y.o. Female has a past medical history of Hypertension; Diabetes mellitus with renal disease and  Gastroparesis; Chronic kidney disease stage 4; chronic anemia and Renal cyst, left. who presented to ED secondary to intractable nausea and vomiting. Patient also endorses multiple BM for the last 24 hours prior to admission. She was found to have acute on chronic renal failure, hyperglycemia and metabolic acidosis. Patient was admitted for further evaluation and treatment.  Hospital Course:  1-acute on chronic renal failure: Appears to be secondary to prerenal azotemia with ongoing nausea and vomiting prior to admission. Patient also endorses taking too much Lasix to get rid of extra fluid buildup -Patient had responded well to bicarbonate drip -Creatinine is trending down appropriately and no significant fluid overload appreciate on exam -Will discharge on oral bicarbonate -patient advise to follow low sodium  diet -will follow up with renal service as an outpatient -patient to minimize/hold use of lasix until follow up visit and maintain adequate hydration   2-gastroparesis: After discussing with patient well documented gastroparesis process which has been already evaluated and confirmed while she was in [place] -Will resume Reglan (patient has been taking these intermittently; due to lack of proper education) -Will continue controlling her diabetes  -advise to follow multiple small meals daily   3-type 2 diabetes mellitus with hyperglycemia and chronic kidney disease -Will continue sliding scale insulin and Lantus (last one adjusted to 8 units daily); patient had episodes of hypoglycemia with higher dose  -Continue low carbohydrates diet -A1c 6.9  4-essential hypertension: Fairly well control -Will continue current antihypertensive regimen -advise to follow low sodium diet   5-tobacco abuse: Cessation counseling has been provided -Will continue nicotine patch  6-Chronic constipation: Will initiate therapy with Colace and miralax  7-Sirs/metabolic acidosis: Appears to be secondary to dehydration with acute on chronic renal failure -No signs of infection has been demonstrated and cx's remained negative   Procedures:  See below for x-ray reports   Consultations:  Renal service   Discharge Exam: Filed Vitals:   12/20/14 0505 12/20/14 0950  BP: 164/71 165/70  Pulse: 48   Temp: 98.6 F (37 C)   Resp: 18     General: Afebrile, reports no further nausea or vomiting. Patient endorses that after having multiple bowel movements right prior to come to the hospital her appetite is now back and she is feeling much better. Patient had hx of constipation and gastroparesis   Cardiovascular: S1 and S2, no rubs, no gallops  Respiratory: Good air movement, clear to auscultation bilaterally  Abdomen: Positive bowel sounds, soft, no guarding  Musculoskeletal: Trace edema  bilaterally.   Discharge Instructions   Discharge Instructions    Diet - low sodium heart healthy    Complete by:  As directed      Discharge instructions    Complete by:  As directed   Keep yourself well hydrated Take medications as prescribed [facility] as [facility] for follow up appointment  Please follow low sodium diet (less than  daily) Outpatient follow up with renal service          Current Discharge Medication List    START taking these medications   Details  docusate sodium (COLACE) 50 MG capsule Take 1 capsule (50 mg total) by mouth 2 (two) times daily. Qty: 60 capsule, Refills: 0    nicotine (NICODERM CQ - DOSED IN MG/24 HOURS) 21 mg/24hr patch Place 1 patch (21 mg total) onto the skin daily. Qty: 28 patch, Refills: 0    polyethylene glycol (MIRALAX / GLYCOLAX) packet Take 17 g by mouth daily. Qty: 14 each, Refills: 0    sodium bicarbonate 650 MG tablet Take 1 tablet (650 mg total) by mouth 2 (two) times daily. Qty: 60 tablet, Refills: 3      CONTINUE these medications which have CHANGED   Details  insulin glargine (LANTUS) 100 UNIT/ML injection Inject 0.08 mLs (8 Units total) into the skin at bedtime.    metoCLOPramide (REGLAN) 5 MG tablet Take 1 tablet (5 mg total) by mouth 4 (four) times daily -  before meals and at bedtime. Qty: 120 tablet, Refills: 1      CONTINUE these medications which have NOT CHANGED   Details  acetaminophen (TYLENOL) 500 MG tablet Take 1,000 mg by mouth every 6 (six) hours as needed for moderate pain.    cloNIDine (CATAPRES) 0.3 MG tablet Take 1 tablet (0.3 mg total) by mouth 2 (two) times daily. Qty: 60 tablet, Refills: 5   Associated Diagnoses: Essential hypertension    diltiazem (CARDIZEM CD) 180 MG 24 hr capsule Take 2 capsules (360 mg total) by mouth daily. Qty: 60 capsule, Refills: 5   Associated Diagnoses: Essential hypertension    furosemide (LASIX) 40 MG tablet Take 1 tablet (40 mg total)  by mouth daily as needed for fluid or edema. Qty: 30 tablet, Refills: 1   Associated Diagnoses: Essential hypertension    hydrALAZINE (APRESOLINE) 100 MG tablet Take 1 tablet (100 mg total) by mouth 3 (three) times daily. Qty: 90 tablet, Refills: 5   Associated Diagnoses: Essential hypertension    insulin aspart (NOVOLOG FLEXPEN) 100 UNIT/ML FlexPen Inject 1-10 Units into the skin 3 (three) times daily with meals.    promethazine (PHENERGAN) 25 MG suppository Place 1 suppository (25 mg total) rectally every 6 (six) hours as needed for nausea or vomiting. Qty: 30 each, Refills: 1      STOP taking these medications     ondansetron (ZOFRAN ODT) 4 MG disintegrating tablet        No Known Allergies Follow-up Information    Follow up with Lora Paula, MD. Go on 01/07/2015.   Specialty:  Family Medicine   Why:  at 9:00 am   Contact information:   [address] [place] [address] [phone]        The results of significant diagnostics from this hospitalization (including imaging, microbiology, ancillary and laboratory) are listed below for reference.    Significant  Diagnostic Studies: Dg Chest Port 1 View  12/19/2014  CLINICAL DATA:  Sepsis EXAM: PORTABLE CHEST - 1 VIEW COMPARISON:  None. FINDINGS: Cardiac shadow is enlarged. The lungs are well aerated bilaterally. No focal infiltrate or sizable effusion is seen. No bony abnormality is noted. IMPRESSION: No acute Electronically Signed   By: Alcide CleverMark  Lukens M.D.   On: 12/19/2014 08:37   Dg Abd Portable 1v  12/19/2014  CLINICAL DATA:  Nausea and vomiting. EXAM: PORTABLE ABDOMEN - 1 VIEW COMPARISON:  None. FINDINGS: Gas-filled loops of colon are present. No disproportionate dilatation of bowel. Artifact from overlying objects limits the examination. No obvious free intraperitoneal gas. IMPRESSION: Nonobstructive bowel gas pattern. Electronically Signed   By: Jolaine ClickArthur  Hoss M.D.   On: 12/19/2014 08:35     Microbiology: Recent Results (from the past 240 hour(s))  Culture, blood (x 2)     Status: None (Preliminary result)   Collection Time: 12/18/14  9:45 PM  Result Value Ref Range Status   Specimen Description BLOOD LEFT ARM  Final   Special Requests BOTTLES DRAWN AEROBIC ONLY 10CC AEROBIC ONLY  Final   Culture   Final    NO GROWTH 1 DAY [facility]formed at [facility]    Report Status PENDING  Incomplete  Culture, blood (x 2)     Status: None (Preliminary result)   Collection Time: 12/18/14  9:50 PM  Result Value Ref Range Status   Specimen Description BLOOD RIGHT HAND  Final   Special Requests IN PEDIATRIC BOTTLE 2CC  Final   Culture   Final    NO GROWTH 1 DAY Performed at [facility]    Report Status PENDING  Incomplete     Labs: Basic Metabolic Panel:  Recent Labs Lab 12/18/14 1507 12/18/14 2145 12/19/14 0217  NA 140 142 144  K 3.4* 4.1 3.9  CL 104 106 111  CO2 15* 16* 21*  GLUCOSE 438* 316* 166*  BUN 62* 61* 57*  CREATININE 5.32* 5.43* 5.09*  CALCIUM 9.6 9.4 8.5*  MG  --   --  2.0  PHOS  --   --  2.5   Liver Function Tests:  Recent Labs Lab 12/18/14 2145 12/19/14 0217  AST 17 15  ALT 10* 9*  ALKPHOS 98 77  BILITOT 1.1 0.6  PROT 8.0 6.5  ALBUMIN 4.8 3.8   CBC:  Recent Labs Lab 12/18/14 1507 12/18/14 2145 12/19/14 0217  WBC 15.3* 11.7* 9.7  NEUTROABS  --  10.8*  --   HGB 9.0* 8.9* 7.5*  HCT 27.6* 27.1* 22.8*  MCV 86.8 87.1 86.7  PLT 215 225 158   Cardiac Enzymes:  Recent Labs Lab 12/18/14 2145 12/19/14 0217 12/19/14 0851  TROPONINI 0.05* 0.06* 0.04*   CBG:  Recent Labs Lab 12/20/14 0409 12/20/14 0437 12/20/14 0500 12/20/14 0807 12/20/14 1203  GLUCAP 61* 65 120* 125* 123*    Signed:  Vassie LollMadera, Byrd Rushlow  [facility] 12/20/2014, 3:45 PM

## 2014-12-20 NOTE — Telephone Encounter (Signed)
Call received from Lanier ClamKathy Mahabir, RN CM requesting a hospital follow up appointment for the patient.  Discussed the issue of the patient's current insurance- [employer] and that she would need to confirm that the insurance is cancelled in order to apply for the orange card/[facility] and qualify for referrals to designated specialists.   The patient has [facility] Pick[facility]Funches [facility] and a hospital follow up appointment was scheduled for 01/07/15 @ 0900 with Dr Armen PickupFunches.  The appointment information was placed on the AVS.  Update provided to K. Mahabor, RN CM.

## 2014-12-24 LAB — CULTURE, BLOOD (ROUTINE X 2)
CULTURE: NO GROWTH
Culture: NO GROWTH

## 2015-01-07 ENCOUNTER — Other Ambulatory Visit: Payer: Self-pay | Admitting: *Deleted

## 2015-01-07 ENCOUNTER — Ambulatory Visit: Payer: Medicare (Managed Care) | Attending: Family Medicine | Admitting: Family Medicine

## 2015-01-07 ENCOUNTER — Encounter: Payer: Self-pay | Admitting: Family Medicine

## 2015-01-07 VITALS — BP 150/70 | HR 44 | Temp 97.9°F | Resp 16 | Ht 64.0 in | Wt 178.0 lb

## 2015-01-07 DIAGNOSIS — N189 Chronic kidney disease, unspecified: Secondary | ICD-10-CM

## 2015-01-07 DIAGNOSIS — E1122 Type 2 diabetes mellitus with diabetic chronic kidney disease: Secondary | ICD-10-CM | POA: Diagnosis not present

## 2015-01-07 DIAGNOSIS — N946 Dysmenorrhea, unspecified: Secondary | ICD-10-CM | POA: Insufficient documentation

## 2015-01-07 DIAGNOSIS — K5909 Other constipation: Secondary | ICD-10-CM

## 2015-01-07 DIAGNOSIS — K5900 Constipation, unspecified: Secondary | ICD-10-CM | POA: Insufficient documentation

## 2015-01-07 DIAGNOSIS — E872 Acidosis: Secondary | ICD-10-CM | POA: Insufficient documentation

## 2015-01-07 DIAGNOSIS — D631 Anemia in chronic kidney disease: Secondary | ICD-10-CM

## 2015-01-07 DIAGNOSIS — N184 Chronic kidney disease, stage 4 (severe): Secondary | ICD-10-CM | POA: Diagnosis not present

## 2015-01-07 DIAGNOSIS — I129 Hypertensive chronic kidney disease with stage 1 through stage 4 chronic kidney disease, or unspecified chronic kidney disease: Secondary | ICD-10-CM | POA: Insufficient documentation

## 2015-01-07 DIAGNOSIS — Z79899 Other long term (current) drug therapy: Secondary | ICD-10-CM | POA: Insufficient documentation

## 2015-01-07 DIAGNOSIS — Z794 Long term (current) use of insulin: Secondary | ICD-10-CM | POA: Diagnosis not present

## 2015-01-07 DIAGNOSIS — I10 Essential (primary) hypertension: Secondary | ICD-10-CM | POA: Insufficient documentation

## 2015-01-07 DIAGNOSIS — F17200 Nicotine dependence, unspecified, uncomplicated: Secondary | ICD-10-CM | POA: Insufficient documentation

## 2015-01-07 LAB — BASIC METABOLIC PANEL
BUN: 58 mg/dL — AB (ref 7–25)
CALCIUM: 9.1 mg/dL (ref 8.6–10.2)
CO2: 25 mmol/L (ref 20–31)
CREATININE: 4.73 mg/dL — AB (ref 0.50–1.10)
Chloride: 105 mmol/L (ref 98–110)
GLUCOSE: 271 mg/dL — AB (ref 65–99)
Potassium: 4.7 mmol/L (ref 3.5–5.3)
Sodium: 135 mmol/L (ref 135–146)

## 2015-01-07 LAB — GLUCOSE, POCT (MANUAL RESULT ENTRY): POC Glucose: 277 mg/dl — AB (ref 70–99)

## 2015-01-07 MED ORDER — GLUCOSE BLOOD VI STRP: ORAL_STRIP | Status: DC

## 2015-01-07 MED ORDER — PROMETHAZINE HCL 25 MG PO TABS: 25.0000 mg | ORAL_TABLET | Freq: Three times a day (TID) | ORAL | Status: DC | PRN

## 2015-01-07 MED ORDER — FUROSEMIDE 40 MG PO TABS: 40.0000 mg | ORAL_TABLET | Freq: Every day | ORAL | Status: DC

## 2015-01-07 MED ORDER — DOCUSATE SODIUM 100 MG PO CAPS: 100.0000 mg | ORAL_CAPSULE | Freq: Two times a day (BID) | ORAL | Status: DC | PRN

## 2015-01-07 MED ORDER — POLYETHYLENE GLYCOL 3350 17 G PO PACK: 17.0000 g | PACK | Freq: Every day | ORAL | Status: DC | PRN

## 2015-01-07 MED ORDER — TRUEPLUS LANCETS 28G MISC: 1.0000 | Freq: Three times a day (TID) | Status: DC

## 2015-01-07 MED ORDER — TRUE METRIX METER W/DEVICE KIT: 1.0000 | PACK | Freq: Three times a day (TID) | Status: DC

## 2015-01-07 MED FILL — PROMETHAZINE 25 MG TABLET: 25 | 7 days supply | Qty: 20 | Fill #0

## 2015-01-07 MED FILL — TRUEplus LANCETS 28G MISC: 33 days supply | Qty: 100 | Fill #0

## 2015-01-07 MED FILL — !TRUE METRIX BLOOD GLUCOSE: 365 days supply | Qty: 1 | Fill #0

## 2015-01-07 MED FILL — POLYETHYLENE GLYCOL 3350 PO: 14 days supply | Qty: 255 | Fill #0

## 2015-01-07 MED FILL — ?FUROSEMIDE 40 MG TABLET: 40 | 30 days supply | Qty: 30 | Fill #0

## 2015-01-07 MED FILL — TRUE METRIX TEST STRIP: 25 days supply | Qty: 100 | Fill #0

## 2015-01-07 NOTE — Assessment & Plan Note (Signed)
A: HTN and bradycardia  with evidence of fluid overload P: Add back lasix 40 mg daily BMP Renal referral

## 2015-01-07 NOTE — Progress Notes (Signed)
HFU vomiting, low potassium  No pain today  Tobacco user 2 cigarette per day  No suicidal thought in the past two weeks

## 2015-01-07 NOTE — Progress Notes (Signed)
Patient ID: Crystal Tanner, female   DOB: 1970-04-05, 45 y.o.   MRN: [id number]   Subjective:  Patient ID: Crystal Tanner, female    DOB: [birth year]  Age: 45 y.o. MRN: [id number]  CC: Hospitalization Follow-up   HPI Crystal Tanner presents for    1. HFU: acute on chronic CKD and metabolic acidosis from 12/17-12/19/16. Cr peaked to 54. From baseline of 4.1-4.3. Bicarb was down to 15.  This was in setting of acute nausea and emesis related to menses, as well as chronic HTN and diabetes. The same thing occurred in November around her menses. She has not had nausea or emesis since then. She has not taken lasix as instructed. She has leg swelling. She denies fever, chills, flank pain and dysuria. She admits to being cold and fatigue. She has anemia. She has not established with renal reporting that she could not afford the  $275 co-pay that she was informed would be required for the initial consult.   2. HTN: she reports compliance with all meds except lasix. She denies HA, CP or SOB. She admits to leg swelling.   3. Diabetes: taking lantus 8 U and SSI novolog. She did not take novolog today after breakfast. Most days she reports CBGs are doing well and she does not need to take novolog.   4. Chronic constipation: she has BM reported only during menses. She is not taking miralax or colace. No abdominal pain. No blood in stool. No nausea or emesis.   Social History  Substance Use Topics  . Smoking status: Current Every Day Smoker  . Smokeless tobacco: Never Used  . Alcohol Use: No     Comment: less than a ppd, "not much"   Outpatient Prescriptions Prior to Visit  Medication Sig Dispense Refill  . acetaminophen (TYLENOL) 500 MG tablet Take 1,000 mg by mouth every 6 (six) hours as needed for moderate pain.    . cloNIDine (CATAPRES) 0.3 MG tablet Take 1 tablet (0.3 mg total) by mouth 2 (two) times daily. 60 tablet 5  . diltiazem (CARDIZEM CD) 180 MG 24 hr capsule Take 2 capsules (360 mg total) by  mouth daily. 60 capsule 5  . furosemide (LASIX) 40 MG tablet Take 1 tablet (40 mg total) by mouth daily as needed for fluid or edema. 30 tablet 1  . hydrALAZINE (APRESOLINE) 100 MG tablet Take 1 tablet (100 mg total) by mouth 3 (three) times daily. 90 tablet 5  . insulin aspart (NOVOLOG FLEXPEN) 100 UNIT/ML FlexPen Inject 1-10 Units into the skin 3 (three) times daily with meals.    . insulin glargine (LANTUS) 100 UNIT/ML injection Inject 0.08 mLs (8 Units total) into the skin at bedtime.    . docusate sodium (COLACE) 50 MG capsule Take 1 capsule (50 mg total) by mouth 2 (two) times daily. (Patient not taking: Reported on 01/07/2015) 60 capsule 0  . metoCLOPramide (REGLAN) 5 MG tablet Take 1 tablet (5 mg total) by mouth 4 (four) times daily -  before meals and at bedtime. (Patient not taking: Reported on 01/07/2015) 120 tablet 1  . nicotine (NICODERM CQ - DOSED IN MG/24 HOURS) 21 mg/24hr patch Place 1 patch (21 mg total) onto the skin daily. (Patient not taking: Reported on 01/07/2015) 28 patch 0  . polyethylene glycol (MIRALAX / GLYCOLAX) packet Take 17 g by mouth daily. (Patient not taking: Reported on 01/07/2015) 14 each 0  . promethazine (PHENERGAN) 25 MG suppository Place 1 suppository (25 mg total) rectally every 6 (six)  hours as needed for nausea or vomiting. (Patient not taking: Reported on 12/18/2014) 30 each 1  . sodium bicarbonate 650 MG tablet Take 1 tablet (650 mg total) by mouth 2 (two) times daily. (Patient not taking: Reported on 01/07/2015) 60 tablet 3   No facility-administered medications prior to visit.    ROS Review of Systems  Constitutional: Positive for appetite change and fatigue. Negative for fever and chills.  Eyes: Negative for visual disturbance.  Respiratory: Negative for shortness of breath.   Cardiovascular: Positive for leg swelling. Negative for chest pain.  Gastrointestinal: Negative for abdominal pain and blood in stool.  Endocrine: Positive for cold intolerance.    Musculoskeletal: Negative for back pain and arthralgias.  Skin: Negative for rash.  Allergic/Immunologic: Negative for immunocompromised state.  Hematological: Negative for adenopathy. Does not bruise/bleed easily.  Psychiatric/Behavioral: Positive for sleep disturbance. Negative for suicidal ideas and dysphoric mood.    Objective:  BP 150/70 mmHg  Pulse 44  Temp(Src) 97.9 F (36.6 C) (Oral)  Resp 16  Ht 5\' 4"  (1.626 m)  Wt 178 lb (80.74 kg)  BMI 30.54 kg/m2  SpO2 100%  LMP 11/11/2014  BP/Weight 01/07/2015 12/20/2014 12/18/2014  Systolic BP 150 165 -  Diastolic BP 70 70 -  Wt. (Lbs) 178 - 162.2  BMI 30.54 - 25.4   Physical Exam  Constitutional: She is oriented to person, place, and time. She appears well-developed and well-nourished. No distress.  HENT:  Head: Normocephalic and atraumatic.  Cardiovascular: Regular rhythm, normal heart sounds and intact distal pulses.  Bradycardia present.   Pulmonary/Chest: Effort normal and breath sounds normal.  Musculoskeletal: She exhibits edema (1+ in legs b/l ).  Neurological: She is alert and oriented to person, place, and time.  Skin: Skin is warm and dry. No rash noted.  Psychiatric: She has a normal mood and affect.   Lab Results  Component Value Date   HGBA1C 6.9* 12/18/2014   CBG 277  Assessment & Plan:   Problem List Items Addressed This Visit    Anemia (Chronic)    A: Anemia of chronic CKD suspected P: Treat chronic constipation Add iron supplement once constipation improved Renal referral       Chronic constipation (Chronic)   Relevant Medications   polyethylene glycol (MIRALAX / GLYCOLAX) packet   docusate sodium (COLACE) 100 MG capsule   CKD (chronic kidney disease) (Chronic)    A: CKD in setting of HTN and diabetes. Diabetes is well controlled. She developed acute on chronic CKD in setting of nausea and emesis related to dysmenorrhea  P: Add back lasix for BP and fluid control Renal referral  BMP  today Phenergan prn nausea/emesis during menstrual periods       Relevant Medications   furosemide (LASIX) 40 MG tablet   Other Relevant Orders   Basic Metabolic Panel   Ambulatory referral to Nephrology   Diabetes mellitus (HCC) - Primary (Chronic)   Relevant Medications   glucose blood (FREESTYLE TEST STRIPS) test strip   Other Relevant Orders   POCT glucose (manual entry) (Completed)   Ambulatory referral to Nephrology   Dysmenorrhea (Chronic)   Relevant Medications   promethazine (PHENERGAN) 25 MG tablet   HTN (hypertension) (Chronic)    A: HTN and bradycardia  with evidence of fluid overload P: Add back lasix 40 mg daily BMP Renal referral       Relevant Medications   furosemide (LASIX) 40 MG tablet   Other Relevant Orders   Ambulatory referral to Nephrology  No orders of the defined types were placed in this encounter.    Follow-up: No Follow-up on file.   Boykin Nearing MD

## 2015-01-07 NOTE — Patient Instructions (Addendum)
Crystal Tanner was seen today for hospitalization follow-up.  Diagnoses and all orders for this visit:  Type 2 diabetes mellitus with stage 4 chronic kidney disease, with long-term current use of insulin (HCC) -     POCT glucose (manual entry) -     glucose blood (FREESTYLE TEST STRIPS) test strip; Use as instructed -     Ambulatory referral to Nephrology  CKD (chronic kidney disease), stage 4 (severe) (HCC) -     Basic Metabolic Panel -     Cancel: Ambulatory referral to Nephrology -     furosemide (LASIX) 40 MG tablet; Take 1 tablet (40 mg total) by mouth daily. -     Ambulatory referral to Nephrology  Dysmenorrhea -     promethazine (PHENERGAN) 25 MG tablet; Take 1 tablet (25 mg total) by mouth every 8 (eight) hours as needed for nausea or vomiting. During menstrual cycle  Chronic constipation -     polyethylene glycol (MIRALAX / GLYCOLAX) packet; Take 17 g by mouth daily as needed. -     docusate sodium (COLACE) 100 MG capsule; Take 1 capsule (100 mg total) by mouth 2 (two) times daily as needed for mild constipation.  Essential hypertension -     furosemide (LASIX) 40 MG tablet; Take 1 tablet (40 mg total) by mouth daily. -     Ambulatory referral to Nephrology   Start miralax and colace  for chronic constipation Once constipation improves, you will also need to start iron for anemia. Will wait to start iron as it can worsen constipation   F/u in 4 weeks for pharmacy BP check  Dr. Armen PickupFunches

## 2015-01-07 NOTE — Assessment & Plan Note (Addendum)
A: CKD in setting of HTN and diabetes. Diabetes is well controlled. She developed acute on chronic CKD in setting of nausea and emesis related to dysmenorrhea  P: Add back lasix for BP and fluid control Renal referral  BMP today Phenergan prn nausea/emesis during menstrual periods

## 2015-01-07 NOTE — Assessment & Plan Note (Signed)
A: Anemia of chronic CKD suspected P: Treat chronic constipation Add iron supplement once constipation improved Renal referral

## 2015-01-11 ENCOUNTER — Telehealth: Payer: Self-pay | Admitting: *Deleted

## 2015-01-11 NOTE — Telephone Encounter (Signed)
-----   Message from Dessa PhiJosalyn Funches, MD sent at 01/10/2015  8:05 PM EST ----- Cr improved to 4.7 Patient still needs to establish with nephrology

## 2015-01-11 NOTE — Telephone Encounter (Signed)
Date of birth verified by pt  Lab results given  Cr improving, make sure to maintain Nephrology appointment Pt verbalized understanding

## 2015-01-19 MED FILL — DILTIAZEM 24HR CD 180 MG CA: 180 | 30 days supply | Qty: 60 | Fill #3

## 2015-01-19 MED FILL — hydrALAZINE HCL 100 MG TABS: 100 | 30 days supply | Qty: 90 | Fill #3

## 2015-01-19 MED FILL — cloNIDine HCL 0.3 MG TABS: 0.3 | 30 days supply | Qty: 60 | Fill #3

## 2015-02-16 ENCOUNTER — Other Ambulatory Visit: Payer: Self-pay | Admitting: Family Medicine

## 2015-02-16 MED FILL — hydrALAZINE HCL 100 MG TABS: 100 | 30 days supply | Qty: 90 | Fill #4

## 2015-02-16 MED FILL — DILTIAZEM 24HR ER 180 MG CA: 180 | 30 days supply | Qty: 60 | Fill #4

## 2015-02-16 MED FILL — cloNIDine HCL 0.3 MG TABS: 0.3 | 30 days supply | Qty: 60 | Fill #4

## 2015-02-19 ENCOUNTER — Encounter (HOSPITAL_COMMUNITY): Payer: Self-pay | Admitting: Emergency Medicine

## 2015-02-19 ENCOUNTER — Emergency Department (HOSPITAL_COMMUNITY): Payer: Self-pay

## 2015-02-19 ENCOUNTER — Observation Stay (HOSPITAL_COMMUNITY)
Admission: EM | Admit: 2015-02-19 | Discharge: 2015-02-20 | Discharge status: Against Medical Advice | Payer: Self-pay | Attending: Internal Medicine | Admitting: Internal Medicine

## 2015-02-19 DIAGNOSIS — I150 Renovascular hypertension: Secondary | ICD-10-CM | POA: Diagnosis present

## 2015-02-19 DIAGNOSIS — R1110 Vomiting, unspecified: Secondary | ICD-10-CM

## 2015-02-19 DIAGNOSIS — E1143 Type 2 diabetes mellitus with diabetic autonomic (poly)neuropathy: Secondary | ICD-10-CM | POA: Insufficient documentation

## 2015-02-19 DIAGNOSIS — K5900 Constipation, unspecified: Secondary | ICD-10-CM

## 2015-02-19 DIAGNOSIS — Z79899 Other long term (current) drug therapy: Secondary | ICD-10-CM | POA: Insufficient documentation

## 2015-02-19 DIAGNOSIS — K3184 Gastroparesis: Secondary | ICD-10-CM | POA: Insufficient documentation

## 2015-02-19 DIAGNOSIS — Z794 Long term (current) use of insulin: Secondary | ICD-10-CM | POA: Insufficient documentation

## 2015-02-19 DIAGNOSIS — I129 Hypertensive chronic kidney disease with stage 1 through stage 4 chronic kidney disease, or unspecified chronic kidney disease: Secondary | ICD-10-CM | POA: Insufficient documentation

## 2015-02-19 DIAGNOSIS — E1122 Type 2 diabetes mellitus with diabetic chronic kidney disease: Secondary | ICD-10-CM | POA: Insufficient documentation

## 2015-02-19 DIAGNOSIS — N185 Chronic kidney disease, stage 5: Secondary | ICD-10-CM | POA: Diagnosis present

## 2015-02-19 DIAGNOSIS — K2970 Gastritis, unspecified, without bleeding: Secondary | ICD-10-CM | POA: Insufficient documentation

## 2015-02-19 DIAGNOSIS — F17200 Nicotine dependence, unspecified, uncomplicated: Secondary | ICD-10-CM | POA: Insufficient documentation

## 2015-02-19 DIAGNOSIS — N281 Cyst of kidney, acquired: Secondary | ICD-10-CM | POA: Insufficient documentation

## 2015-02-19 DIAGNOSIS — Z5321 Procedure and treatment not carried out due to patient leaving prior to being seen by health care provider: Secondary | ICD-10-CM | POA: Insufficient documentation

## 2015-02-19 DIAGNOSIS — K5909 Other constipation: Principal | ICD-10-CM | POA: Insufficient documentation

## 2015-02-19 DIAGNOSIS — I10 Essential (primary) hypertension: Secondary | ICD-10-CM

## 2015-02-19 DIAGNOSIS — N184 Chronic kidney disease, stage 4 (severe): Secondary | ICD-10-CM | POA: Insufficient documentation

## 2015-02-19 DIAGNOSIS — R112 Nausea with vomiting, unspecified: Secondary | ICD-10-CM | POA: Insufficient documentation

## 2015-02-19 LAB — URINALYSIS, ROUTINE W REFLEX MICROSCOPIC
Bilirubin Urine: NEGATIVE
Glucose, UA: NEGATIVE mg/dL
HGB URINE DIPSTICK: NEGATIVE
Ketones, ur: NEGATIVE mg/dL
LEUKOCYTES UA: NEGATIVE
NITRITE: NEGATIVE
Protein, ur: >300 mg/dL — AB
SPECIFIC GRAVITY, URINE: 1.013 (ref 1.005–1.030)
pH: 6 (ref 5.0–8.0)

## 2015-02-19 LAB — COMPREHENSIVE METABOLIC PANEL
ALBUMIN: 5 g/dL (ref 3.5–5.0)
ALBUMIN: 4.2 g/dL (ref 3.5–5.0)
ALK PHOS: 99 U/L (ref 38–126)
ALT: 9 U/L — AB (ref 14–54)
ALT: 9 U/L — ABNORMAL LOW (ref 14–54)
ANION GAP: 15 (ref 5–15)
AST: 20 U/L (ref 15–41)
AST: 16 U/L (ref 15–41)
Alkaline Phosphatase: 89 U/L (ref 38–126)
Anion gap: 17 — ABNORMAL HIGH (ref 5–15)
BUN: 54 mg/dL — AB (ref 6–20)
BUN: 54 mg/dL — ABNORMAL HIGH (ref 6–20)
CO2: 17 mmol/L — ABNORMAL LOW (ref 22–32)
CO2: 19 mmol/L — ABNORMAL LOW (ref 22–32)
Calcium: 9.7 mg/dL (ref 8.9–10.3)
Calcium: 9.2 mg/dL (ref 8.9–10.3)
Chloride: 108 mmol/L (ref 101–111)
Chloride: 112 mmol/L — ABNORMAL HIGH (ref 101–111)
Creatinine, Ser: 5.16 mg/dL — ABNORMAL HIGH (ref 0.44–1.00)
Creatinine, Ser: 5.19 mg/dL — ABNORMAL HIGH (ref 0.44–1.00)
GFR calc Af Amer: 11 mL/min — ABNORMAL LOW (ref >60)
GFR calc Af Amer: 11 mL/min — ABNORMAL LOW (ref >60)
GFR calc non Af Amer: 9 mL/min — ABNORMAL LOW (ref >60)
GFR, EST NON AFRICAN AMERICAN: 9 mL/min — AB (ref >60)
GLUCOSE: 243 mg/dL — AB (ref 65–99)
Glucose, Bld: 173 mg/dL — ABNORMAL HIGH (ref 65–99)
POTASSIUM: 4 mmol/L (ref 3.5–5.1)
Potassium: 4.4 mmol/L (ref 3.5–5.1)
SODIUM: 142 mmol/L (ref 135–145)
Sodium: 146 mmol/L — ABNORMAL HIGH (ref 135–145)
TOTAL PROTEIN: 9 g/dL — AB (ref 6.5–8.1)
TOTAL PROTEIN: 7.6 g/dL (ref 6.5–8.1)
Total Bilirubin: 0.9 mg/dL (ref 0.3–1.2)
Total Bilirubin: 0.8 mg/dL (ref 0.3–1.2)

## 2015-02-19 LAB — CBC WITH DIFFERENTIAL/PLATELET
BASOS ABS: 0.1 10*3/uL (ref 0.0–0.1)
BASOS PCT: 1 %
Eosinophils Absolute: 0.3 10*3/uL (ref 0.0–0.7)
Eosinophils Relative: 3 %
HEMATOCRIT: 28.7 % — AB (ref 36.0–46.0)
Hemoglobin: 9.2 g/dL — ABNORMAL LOW (ref 12.0–15.0)
Lymphocytes Relative: 18 %
Lymphs Abs: 1.6 10*3/uL (ref 0.7–4.0)
MCH: 28 pg (ref 26.0–34.0)
MCHC: 32.1 g/dL (ref 30.0–36.0)
MCV: 87.5 fL (ref 78.0–100.0)
MONO ABS: 0.4 10*3/uL (ref 0.1–1.0)
Monocytes Relative: 4 %
NEUTROS ABS: 6.5 10*3/uL (ref 1.7–7.7)
NEUTROS PCT: 74 %
Platelets: 273 10*3/uL (ref 150–400)
RBC: 3.28 MIL/uL — AB (ref 3.87–5.11)
RDW: 13.8 % (ref 11.5–15.5)
WBC: 8.9 10*3/uL (ref 4.0–10.5)

## 2015-02-19 LAB — URINE MICROSCOPIC-ADD ON: RBC / HPF: NONE SEEN RBC/hpf (ref 0–5)

## 2015-02-19 LAB — GLUCOSE, CAPILLARY
GLUCOSE-CAPILLARY: 276 mg/dL — AB (ref 65–99)
GLUCOSE-CAPILLARY: 194 mg/dL — AB (ref 65–99)
Glucose-Capillary: 152 mg/dL — ABNORMAL HIGH (ref 65–99)

## 2015-02-19 LAB — LIPASE, BLOOD: LIPASE: 15 U/L (ref 11–51)

## 2015-02-19 LAB — MRSA PCR SCREENING: MRSA BY PCR: NEGATIVE

## 2015-02-19 LAB — I-STAT CG4 LACTIC ACID, ED: LACTIC ACID, VENOUS: 1.25 mmol/L (ref 0.5–2.0)

## 2015-02-19 MED ORDER — METOCLOPRAMIDE HCL 5 MG/ML IJ SOLN
10.0000 mg | Freq: Once | INTRAMUSCULAR | Status: AC
  Administered 2015-02-19: 10 mg via INTRAVENOUS
  Filled 2015-02-19: qty 2

## 2015-02-19 MED ORDER — SODIUM CHLORIDE 0.9 % IV BOLUS (SEPSIS)
1000.0000 mL | Freq: Once | INTRAVENOUS | Status: AC
  Administered 2015-02-19: 1000 mL via INTRAVENOUS

## 2015-02-19 MED ORDER — DILTIAZEM HCL ER COATED BEADS 360 MG PO CP24
360.0000 mg | ORAL_CAPSULE | Freq: Every day | ORAL | Status: DC
  Administered 2015-02-20: 360 mg via ORAL
  Filled 2015-02-19: qty 1

## 2015-02-19 MED ORDER — PROMETHAZINE HCL 25 MG/ML IJ SOLN
25.0000 mg | Freq: Once | INTRAMUSCULAR | Status: AC
  Administered 2015-02-19: 25 mg via INTRAVENOUS
  Filled 2015-02-19: qty 1

## 2015-02-19 MED ORDER — ONDANSETRON HCL 4 MG/2ML IJ SOLN
4.0000 mg | Freq: Once | INTRAMUSCULAR | Status: AC
  Administered 2015-02-19: 4 mg via INTRAVENOUS
  Filled 2015-02-19: qty 2

## 2015-02-19 MED ORDER — INSULIN ASPART 100 UNIT/ML ~~LOC~~ SOLN
0.0000 [IU] | SUBCUTANEOUS | Status: DC
  Administered 2015-02-19: 2 [IU] via SUBCUTANEOUS
  Administered 2015-02-19: 5 [IU] via SUBCUTANEOUS
  Administered 2015-02-20: 2 [IU] via SUBCUTANEOUS

## 2015-02-19 MED ORDER — DOCUSATE SODIUM 100 MG PO CAPS: 100.0000 mg | ORAL_CAPSULE | Freq: Two times a day (BID) | ORAL | Status: DC | PRN

## 2015-02-19 MED ORDER — ACETAMINOPHEN 325 MG PO TABS: 650.0000 mg | ORAL_TABLET | Freq: Four times a day (QID) | ORAL | Status: DC | PRN

## 2015-02-19 MED ORDER — PROMETHAZINE HCL 25 MG/ML IJ SOLN: 25.0000 mg | Freq: Once | INTRAMUSCULAR | Status: DC

## 2015-02-19 MED ORDER — METOCLOPRAMIDE HCL 5 MG/ML IJ SOLN
5.0000 mg | Freq: Four times a day (QID) | INTRAMUSCULAR | Status: DC
Start: 2015-02-19 — End: 2015-02-20
  Administered 2015-02-19: 5 mg via INTRAVENOUS
  Filled 2015-02-19: qty 1
  Filled 2015-02-19: qty 2
  Filled 2015-02-19: qty 1
  Filled 2015-02-19: qty 2
  Filled 2015-02-19 (×2): qty 1

## 2015-02-19 MED ORDER — HEPARIN SODIUM (PORCINE) 5000 UNIT/ML IJ SOLN
5000.0000 [IU] | Freq: Three times a day (TID) | INTRAMUSCULAR | Status: DC
  Filled 2015-02-19 (×5): qty 1

## 2015-02-19 MED ORDER — INSULIN GLARGINE 100 UNIT/ML ~~LOC~~ SOLN
4.0000 [IU] | Freq: Every day | SUBCUTANEOUS | Status: DC
  Administered 2015-02-19: 4 [IU] via SUBCUTANEOUS
  Filled 2015-02-19: qty 0.04

## 2015-02-19 MED ORDER — PANTOPRAZOLE SODIUM 40 MG IV SOLR
40.0000 mg | INTRAVENOUS | Status: DC
Start: 2015-02-19 — End: 2015-02-20
  Administered 2015-02-19: 40 mg via INTRAVENOUS
  Filled 2015-02-19 (×2): qty 40

## 2015-02-19 MED ORDER — ACETAMINOPHEN 650 MG RE SUPP: 650.0000 mg | Freq: Four times a day (QID) | RECTAL | Status: DC | PRN

## 2015-02-19 MED ORDER — DOCUSATE SODIUM 100 MG PO CAPS
100.0000 mg | ORAL_CAPSULE | Freq: Two times a day (BID) | ORAL | Status: DC
  Filled 2015-02-19 (×3): qty 1

## 2015-02-19 MED ORDER — HYDRALAZINE HCL 50 MG PO TABS
100.0000 mg | ORAL_TABLET | Freq: Three times a day (TID) | ORAL | Status: DC
  Administered 2015-02-19 – 2015-02-20 (×3): 100 mg via ORAL
  Filled 2015-02-19 (×5): qty 2

## 2015-02-19 MED ORDER — HALOPERIDOL LACTATE 5 MG/ML IJ SOLN
2.0000 mg | Freq: Once | INTRAMUSCULAR | Status: AC
  Administered 2015-02-19: 2 mg via INTRAVENOUS

## 2015-02-19 MED ORDER — SODIUM CHLORIDE 0.9 % IV SOLN
INTRAVENOUS | Status: DC
  Administered 2015-02-19 – 2015-02-20 (×2): via INTRAVENOUS

## 2015-02-19 MED ORDER — CETYLPYRIDINIUM CHLORIDE 0.05 % MT LIQD: 7.0000 mL | Freq: Two times a day (BID) | OROMUCOSAL | Status: DC

## 2015-02-19 MED ORDER — HALOPERIDOL LACTATE 5 MG/ML IJ SOLN
2.0000 mg | Freq: Once | INTRAMUSCULAR | Status: DC
  Filled 2015-02-19: qty 1

## 2015-02-19 MED ORDER — CLONIDINE HCL 0.3 MG PO TABS
0.3000 mg | ORAL_TABLET | Freq: Two times a day (BID) | ORAL | Status: DC
  Administered 2015-02-19 – 2015-02-20 (×2): 0.3 mg via ORAL
  Filled 2015-02-19 (×3): qty 1

## 2015-02-19 MED ORDER — SORBITOL 70 % SOLN
960.0000 mL | TOPICAL_OIL | Freq: Once | ORAL | Status: DC
  Filled 2015-02-19: qty 240

## 2015-02-19 MED ORDER — FLEET ENEMA 7-19 GM/118ML RE ENEM
1.0000 | ENEMA | Freq: Once | RECTAL | Status: DC
  Filled 2015-02-19: qty 1

## 2015-02-19 MED ORDER — ONDANSETRON HCL 4 MG PO TABS: 4.0000 mg | ORAL_TABLET | Freq: Four times a day (QID) | ORAL | Status: DC | PRN

## 2015-02-19 MED ORDER — METOCLOPRAMIDE HCL 10 MG PO TABS: 10.0000 mg | ORAL_TABLET | Freq: Four times a day (QID) | ORAL | Status: DC | PRN

## 2015-02-19 MED ORDER — ONDANSETRON HCL 4 MG/2ML IJ SOLN: 4.0000 mg | Freq: Four times a day (QID) | INTRAMUSCULAR | Status: DC | PRN

## 2015-02-19 MED ORDER — SENNA 8.6 MG PO TABS
1.0000 | ORAL_TABLET | Freq: Two times a day (BID) | ORAL | Status: DC
  Filled 2015-02-19: qty 1

## 2015-02-19 MED ORDER — BISACODYL 10 MG RE SUPP: 10.0000 mg | Freq: Two times a day (BID) | RECTAL | Status: DC

## 2015-02-19 NOTE — ED Notes (Signed)
Pt refused to received enemas at this time.

## 2015-02-19 NOTE — ED Notes (Addendum)
In room to d/c pt and pt started actively n/v. Will hold on d/c and make [place], [place] aware.

## 2015-02-19 NOTE — ED Notes (Signed)
RN Marylee Floras will be starting IV

## 2015-02-19 NOTE — Progress Notes (Signed)
Nurse received report from Alden Benjamin, RN when pt arrived to unit

## 2015-02-19 NOTE — H&P (Signed)
Patient Demographics  Crystal Tanner, is a 45 y.o. female  MRN: [id number]   DOB - 30-Aug-1970  Admit Date - 02/19/2015  Outpatient Primary MD for the patient is Minerva Ends, MD   With History of -  Past Medical History  Diagnosis Date  . Hypertension   . Diabetes mellitus without complication ([place]) 7782  . Gastritis   . Chronic kidney disease   . Acute kidney injury ([place])   . Anemia   . Renal cyst, left       History reviewed. No pertinent past surgical history.  in for   Chief Complaint  Patient presents with  . Emesis     HPI  Crystal Tanner  is a 45 y.o. 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; Chronic kidney disease stage 4 baseline creatinine 4.7; chronic anemia and Renal cyst, history of recurrent intractable nausea and vomiting, recently moved to [facility] [place], couple admissions over last couple missions over last few month for nausea and vomiting with history of gastroparesis, reports she's been having few episodes of nausea and vomiting, no oral intake, symptoms started at midnight, denies coffee-ground emesis, fever, chills, diarrhea, or melena, patient reports chronic constipation, has bowel movement for 3 weeks, reports her symptoms usually presents with severe constipation, so far 5 bowel movements after receiving laxatives in ED, agent received multiple nausea medication, with no significant help, hospitalist requested to admit, KUB significant for including increased stool burden, but no acute findings, no significant labs abnormalities, creatinine is 5.1, baseline is 4.7.    Review of Systems    In addition to the HPI above,  No Fever-chills, No Headache, No changes with Vision or hearing, No problems swallowing food or Liquids, No Chest pain, Cough or Shortness of Breath, No Abdominal pain, complains of Nausea or Vommitting, ports chronic constipation No Blood in stool or  Urine, No dysuria, No new skin rashes or bruises, No new joints pains-aches,  No new weakness, tingling, numbness in any extremity, No recent weight gain or loss, No polyuria, polydypsia or polyphagia, No significant Mental Stressors.  A full 10 point Review of Systems was done, except as stated above, all other Review of Systems were negative.   Social History Social History  Substance Use Topics  . Smoking status: Current Every Day Smoker  . Smokeless tobacco: Never Used  . Alcohol Use: No     Comment: less than a ppd, "not much"     Family History Family History  Problem Relation Age of Onset  . Hypertension Mother   . Diabetes Mother      Prior to Admission medications   Medication Sig Start Date End Date Taking? Authorizing Provider  acetaminophen (TYLENOL) 500 MG tablet Take 1,000 mg by mouth every 6 (six) hours as needed for moderate pain.   Yes Historical Provider, MD  Blood Glucose Monitoring Suppl (TRUE METRIX METER) w/Device KIT 1 each by Does not apply route 3 (three) times daily. 01/07/15  Yes Josalyn Funches, MD  cloNIDine (CATAPRES) 0.3 MG tablet Take 1 tablet (0.3 mg total) by mouth 2 (two) times daily. 10/01/14  Yes Josalyn Funches, MD  diltiazem (CARDIZEM CD) 180 MG 24 hr capsule Take 2 capsules (360 mg total) by mouth daily. 10/01/14  Yes Josalyn Funches, MD  docusate sodium (COLACE) 100 MG capsule Take 1 capsule (100 mg total) by mouth 2 (two) times daily as needed for mild constipation. 01/07/15  Yes Josalyn  Funches, MD  furosemide (LASIX) 40 MG tablet Take 1 tablet (40 mg total) by mouth daily. 01/07/15  Yes Josalyn Funches, MD  glucose blood (FREESTYLE TEST STRIPS) test strip Use as instructed 01/07/15  Yes Josalyn Funches, MD  glucose blood (TRUE METRIX BLOOD GLUCOSE TEST) test strip Use as instructed 01/07/15  Yes Josalyn Funches, MD  hydrALAZINE (APRESOLINE) 100 MG tablet Take 1 tablet (100 mg total) by mouth 3 (three) times daily. 10/01/14  Yes Josalyn Funches,  MD  insulin aspart (NOVOLOG FLEXPEN) 100 UNIT/ML FlexPen Inject 1-10 Units into the skin 3 (three) times daily with meals.   Yes Historical Provider, MD  insulin glargine (LANTUS) 100 UNIT/ML injection Inject 0.08 mLs (8 Units total) into the skin at bedtime. 12/20/14  Yes Barton Dubois, MD  polyethylene glycol [facility] / GLYCOLAX) packet Take 17 g by mouth daily as needed. Patient taking differently: Take 17 g by mouth daily as needed for moderate constipation.  01/07/15  Yes Josalyn Funches, MD  promethazine (PHENERGAN) 25 MG tablet Take 1 tablet (25 mg total) by mouth every 8 (eight) hours as needed for nausea or vomiting. During menstrual cycle 01/07/15  Yes Josalyn Funches, MD  TRUEPLUS LANCETS 28G MISC 1 each by Does not apply route 3 (three) times daily. 01/07/15  Yes Josalyn Funches, MD  metoCLOPramide (REGLAN) 10 MG tablet Take 1 tablet (10 mg total) by mouth every 6 (six) hours as needed for nausea or vomiting. 02/19/15   Olivia Canter Sam, PA-C  nicotine (NICODERM CQ - DOSED IN MG/24 HOURS) 21 mg/24hr patch Place 1 patch (21 mg total) onto the skin daily. Patient not taking: Reported on 01/07/2015 12/20/14   Barton Dubois, MD    No Known Allergies  Physical Exam  Vitals  Blood pressure 173/69, pulse 67, temperature 98.8 F (37.1 C), temperature source Oral, resp. rate 18, height _0  (1.651 m), weight 81.647 kg (180 lb), last menstrual period 11/11/2014, SpO2 96 %.   1. General well-developed female lying in bed in NAD,    2. Normal affect and insight, Not Suicidal or Homicidal, Awake Alert, Oriented X 3.  3. No F.N deficits, ALL C.Nerves Intact, Strength 5/5 all 4 extremities, Sensation intact all 4 extremities, Plantars down going.  4. Ears and Eyes appear Normal, Conjunctivae clear, PERRLA. Moist Oral Mucosa.  5. Supple Neck, No JVD, No cervical lymphadenopathy appriciated, No Carotid Bruits.  6. Symmetrical Chest wall movement, Good air movement bilaterally, CTAB.  7. RRR, No  Gallops, Rubs or Murmurs, No Parasternal Heave.  8. Positive Bowel Sounds, Abdomen Soft, No tenderness, No organomegaly appriciated,No rebound -guarding or rigidity.  9.  No Cyanosis, Normal Skin Turgor, No Skin Rash or Bruise.  10. Good muscle tone,  joints appear normal , no effusions, Normal ROM.  11. No Palpable Lymph Nodes in Neck or Axillae   Data Review  CBC  Recent Labs Lab 02/19/15 1000  WBC 8.9  HGB 9.2*  HCT 28.7*  PLT 273  MCV 87.5  MCH 28.0  MCHC 32.1  RDW 13.8  LYMPHSABS 1.6  MONOABS 0.4  EOSABS 0.3  BASOSABS 0.1   ------------------------------------------------------------------------------------------------------------------  Chemistries   Recent Labs Lab 02/19/15 1000 02/19/15 1300  NA 142 146*  K 4.4 4.0  CL 108 112*  CO2 17* 19*  GLUCOSE 173* 243*  BUN 54* 54*  CREATININE 5.16* 5.19*  CALCIUM 9.7 9.2  AST 20 16  ALT 9* 9*  ALKPHOS 99 89  BILITOT 0.9 0.8   ------------------------------------------------------------------------------------------------------------------ estimated  creatinine clearance is 14.6 mL/min (by C-G formula based on Cr of 5.19). ------------------------------------------------------------------------------------------------------------------ No results for input(s): TSH, T4TOTAL, T3FREE, THYROIDAB in the last 72 hours.  Invalid input(s): FREET3   Coagulation profile No results for input(s): INR, PROTIME in the last 168 hours. ------------------------------------------------------------------------------------------------------------------- No results for input(s): DDIMER in the last 72 hours. -------------------------------------------------------------------------------------------------------------------  Cardiac Enzymes No results for input(s): CKMB, TROPONINI, MYOGLOBIN in the last 168 hours.  Invalid input(s):  CK ------------------------------------------------------------------------------------------------------------------ Invalid input(s): POCBNP   ---------------------------------------------------------------------------------------------------------------  Urinalysis    Component Value Date/Time   COLORURINE YELLOW 02/19/2015 1000   APPEARANCEUR CLEAR 02/19/2015 1000   LABSPEC 1.013 02/19/2015 1000   PHURINE 6.0 02/19/2015 1000   GLUCOSEU NEGATIVE 02/19/2015 1000   HGBUR NEGATIVE 02/19/2015 1000   BILIRUBINUR NEGATIVE 02/19/2015 1000   KETONESUR NEGATIVE 02/19/2015 1000   PROTEINUR >300* 02/19/2015 1000   UROBILINOGEN 0.2 09/08/2014 1400   NITRITE NEGATIVE 02/19/2015 1000   LEUKOCYTESUR NEGATIVE 02/19/2015 1000    ----------------------------------------------------------------------------------------------------------------  Imaging results:   Dg Abd Acute W/chest  02/19/2015  CLINICAL DATA:  N/V 2 days; hx chronic kidney disease; HTN; SOB due to N/V; EXAM: DG ABDOMEN ACUTE W/ 1V CHEST COMPARISON:  12/18/2014 FINDINGS: There is marked increased stool throughout the colon and in the rectum somewhat increased in extent when compared to the prior study. No evidence of bowel obstruction. No free air. Soft tissues are unremarkable. Cardiac silhouette is mildly enlarged. No mediastinal or hilar masses or evidence of adenopathy. Clear lungs. IMPRESSION: 1. No acute findings.  No evidence of bowel obstruction or free air. 2. Marked increased stool throughout the colon and in the rectum. 3. No acute cardiopulmonary disease. Electronically Signed   By: Lajean Manes M.D.   On: 02/19/2015 10:36      Assessment & Plan  Active Problems:   Gastritis   CKD (chronic kidney disease)   Diabetes mellitus (HCC)   HTN (hypertension)   Chronic constipation   Nausea & vomiting  Intractable nausea and vomiting - Patient presents with nausea and vomiting over the last few hours, no improvement  despite multiple antinausea medication, known history of gastroparesis, as well known history of gastritis, she thinks is related to her gastritis, as well reports usually symptoms improve after good bowel regimen with chronic constipation. - We'll continue with when necessary Zofran and Reglan for her nausea, will keep on clear liquid diet and advance as tolerated  Acute on chronic kidney disease stage IV - Baseline creatinine 4.7, today is 5.1, will continue with IV fluids, most likely prerenal  Gastritis - We'll continue with Protonix 40 mg IV daily giving her nausea and vomiting  Diabetes mellitus - CBGs appears to be uncontrolled, will continue with insulin sliding scale, by mouth intake is unpredictable, so we'll lower her home dose Lantus by half.  Hypertension - Uncontrolled, most likely due to vomiting, resume on home medication  Chronic constipation - We'll start on Dulcolax suppository and enemas, once nausea improved. On by mouth female ask    DVT Prophylaxis Heparin   AM Labs Ordered, also please review Full Orders  Family Communication: Admission, patients condition and plan of care including tests being ordered have been discussed with the patient  who indicate understanding and agree with the plan and Code Status.  Code Status Full  Likely DC to  home  Condition GUARDED    Time spent in minutes : 55 minutes    ELGERGAWY, DAWOOD M.D on 02/19/2015 at 3:57 PM  Between 7am  to 7pm - Pager - [phone]  After 7pm go to [email or web address] - password [email or web address]  And look for the night coverage person covering me after hours  [facility] Office  [phone]

## 2015-02-19 NOTE — ED Notes (Signed)
Bed: ZO10 Expected date: 02/19/15 Expected time: 9:11 AM Means of arrival: Ambulance Comments: N/V since yesterday, hx gastritis

## 2015-02-19 NOTE — Discharge Instructions (Signed)
Your labs today were unremarkable. Your kidney function is elevated slightly above your baseline. When financially feasible, please follow up with the kidney doctors. In the meantime, I will give you a prescription for Reglan for nausea/vomiting in case you get similar symptoms again. Drink plenty of water to stay hydrated as you did appear dehydrated today. Please follow up with your primary care provider next week in clinic. Keep taking the miralax she prescribed you for constipation. Return to the ER for new or worsening symptoms.

## 2015-02-19 NOTE — ED Notes (Signed)
Admitting MD at bedside.

## 2015-02-19 NOTE — ED Notes (Signed)
Pt reported having BM's x3 since arrival to facility.

## 2015-02-19 NOTE — ED Notes (Signed)
Pt arrived via EMS with report of generalized weakness and n/v x5 in past 24 hrs. Pt denies diarrhea and abd pain.

## 2015-02-19 NOTE — ED Provider Notes (Signed)
CSN: [id number]     Arrival date & time 02/19/15  9150 History   First MD Initiated Contact with Patient 02/19/15 [phone]     Chief Complaint  Patient presents with  . Emesis    HPI  MS. Beaudoin is an 45 y.o. female with history of HTN, DM, CKD, chronic constipation who presents to the ED for evaluation of N/V. She states she thinks she has gastritis. She states since midnight last night she has felt nauseated with 5-6 episodes of NBNB emesis. Denies diarrhea. Denies abdominal pain. She states she feels like she needs to have a BM as she has not had one in over one week. She states that she is typically chronically constipated and has tried Miralax and Docusate prescribed by her PCP with no relief. She states she typically has ~3 BM monthly. Denies hematemesis. States that she has been able to tolerate PO and keep fluids down. She denies fever, chills. Denies dysuria, urinary frequency/urgency. She denies history of prior SBO. She is requesting an enema and states she has required enemas in the past to relieve her constipation. Of note, pt was admitted to the hospital in 12/2014 for acute on chronic renal failure likely prenal 2/2 nausea and vomiting. She has not been able to follow up with nephrology due to cost.     Past Medical History  Diagnosis Date  . Hypertension   . Diabetes mellitus without complication ([place]) 9480  . Gastritis   . Chronic kidney disease   . Acute kidney injury ([place])   . Anemia   . Renal cyst, left    History reviewed. No pertinent past surgical history. Family History  Problem Relation Age of Onset  . Hypertension Mother   . Diabetes Mother    Social History  Substance Use Topics  . Smoking status: Current Every Day Smoker  . Smokeless tobacco: Never Used  . Alcohol Use: No     Comment: less than a ppd, "not much"   OB History    Gravida Para Term Preterm AB TAB SAB Ectopic Multiple Living   0 0 0 0 0 0 0 0 0 0      Review of Systems  All other systems  reviewed and are negative.     Allergies  Review of patient's allergies indicates no known allergies.  Home Medications   Prior to Admission medications   Medication Sig Start Date End Date Taking? Authorizing Provider  acetaminophen (TYLENOL) 500 MG tablet Take 1,000 mg by mouth every 6 (six) hours as needed for moderate pain.    Historical Provider, MD  Blood Glucose Monitoring Suppl (TRUE METRIX METER) w/Device KIT 1 each by Does not apply route 3 (three) times daily. 01/07/15   Josalyn Funches, MD  cloNIDine (CATAPRES) 0.3 MG tablet Take 1 tablet (0.3 mg total) by mouth 2 (two) times daily. 10/01/14   Josalyn Funches, MD  diltiazem (CARDIZEM CD) 180 MG 24 hr capsule Take 2 capsules (360 mg total) by mouth daily. 10/01/14   Josalyn Funches, MD  docusate sodium (COLACE) 100 MG capsule Take 1 capsule (100 mg total) by mouth 2 (two) times daily as needed for mild constipation. 01/07/15   Josalyn Funches, MD  furosemide (LASIX) 40 MG tablet Take 1 tablet (40 mg total) by mouth daily. 01/07/15   Josalyn Funches, MD  glucose blood (FREESTYLE TEST STRIPS) test strip Use as instructed 01/07/15   Josalyn Funches, MD  glucose blood (TRUE METRIX BLOOD GLUCOSE TEST) test strip Use as  instructed 01/07/15   Boykin Nearing, MD  hydrALAZINE (APRESOLINE) 100 MG tablet Take 1 tablet (100 mg total) by mouth 3 (three) times daily. 10/01/14   Josalyn Funches, MD  insulin aspart (NOVOLOG FLEXPEN) 100 UNIT/ML FlexPen Inject 1-10 Units into the skin 3 (three) times daily with meals.    Historical Provider, MD  insulin glargine (LANTUS) 100 UNIT/ML injection Inject 0.08 mLs (8 Units total) into the skin at bedtime. 12/20/14   Barton Dubois, MD  nicotine (NICODERM CQ - DOSED IN MG/24 HOURS) 21 mg/24hr patch Place 1 patch (21 mg total) onto the skin daily. Patient not taking: Reported on 01/07/2015 12/20/14   Barton Dubois, MD  polyethylene glycol [facility]aveling) packet Take 17 g by mouth daily as needed. 01/07/15   Josalyn  Funches, MD  promethazine (PHENERGAN) 25 MG tablet Take 1 tablet (25 mg total) by mouth every 8 (eight) hours as needed for nausea or vomiting. During menstrual cycle 01/07/15   Boykin Nearing, MD  TRUEPLUS LANCETS 28G MISC 1 each by Does not apply route 3 (three) times daily. 01/07/15   Josalyn Funches, MD   BP 186/70 mmHg  Pulse 80  Temp(Src) 98.8 F (37.1 C) (Oral)  Resp 22  Ht 5' 5"  (1.651 m)  Wt 81.647 kg  BMI 29.95 kg/m2  SpO2 99%  LMP 11/11/2014 Physical Exam  Constitutional: She is oriented to person, place, and time.  Tearful, dry heaving in the room  HENT:  Head: Atraumatic.  Right Ear: External ear normal.  Left Ear: External ear normal.  Nose: Nose normal.  Eyes: Conjunctivae are normal. No scleral icterus.  Neck: Normal range of motion. Neck supple.  Cardiovascular: Normal rate and regular rhythm.   Pulmonary/Chest: Effort normal. No respiratory distress. She exhibits no tenderness.  Abdominal: Soft. Bowel sounds are normal. There is no tenderness.  Abdomen is distended but soft. No tenderness. No guarding. No CVA tenderness. No rebound or percussion tenderness.  Neurological: She is alert and oriented to person, place, and time.  Skin: Skin is warm and dry.  Psychiatric: She has a normal mood and affect. Her behavior is normal.  Nursing note and vitals reviewed.   ED Course  Procedures (including critical care time) Labs Review Labs Reviewed  URINALYSIS, ROUTINE W REFLEX MICROSCOPIC (NOT AT [facility]) - Abnormal; Notable for the following:    Protein, ur >300 (*)    All other components within normal limits  COMPREHENSIVE METABOLIC PANEL - Abnormal; Notable for the following:    CO2 17 (*)    Glucose, Bld 173 (*)    BUN 54 (*)    Creatinine, Ser 5.16 (*)    Total Protein 9.0 (*)    ALT 9 (*)    GFR calc non Af Amer 9 (*)    GFR calc Af Amer 11 (*)    Anion gap 17 (*)    All other components within normal limits  CBC WITH DIFFERENTIAL/PLATELET - Abnormal;  Notable for the following:    RBC 3.28 (*)    Hemoglobin 9.2 (*)    HCT 28.7 (*)    All other components within normal limits  URINE MICROSCOPIC-ADD ON - Abnormal; Notable for the following:    Squamous Epithelial / LPF 0-5 (*)    Bacteria, UA RARE (*)    All other components within normal limits  COMPREHENSIVE METABOLIC PANEL - Abnormal; Notable for the following:    Sodium 146 (*)    Chloride 112 (*)    CO2  19 (*)    Glucose, Bld 243 (*)    BUN 54 (*)    Creatinine, Ser 5.19 (*)    ALT 9 (*)    GFR calc non Af Amer 9 (*)    GFR calc Af Amer 11 (*)    All other components within normal limits  LIPASE, BLOOD  I-STAT CG4 LACTIC ACID, ED    Imaging Review Dg Abd Acute W/chest  02/19/2015  CLINICAL DATA:  N/V 2 days; hx chronic kidney disease; HTN; SOB due to N/V; EXAM: DG ABDOMEN ACUTE W/ 1V CHEST COMPARISON:  12/18/2014 FINDINGS: There is marked increased stool throughout the colon and in the rectum somewhat increased in extent when compared to the prior study. No evidence of bowel obstruction. No free air. Soft tissues are unremarkable. Cardiac silhouette is mildly enlarged. No mediastinal or hilar masses or evidence of adenopathy. Clear lungs. IMPRESSION: 1. No acute findings.  No evidence of bowel obstruction or free air. 2. Marked increased stool throughout the colon and in the rectum. 3. No acute cardiopulmonary disease. Electronically Signed   By: Lajean Manes M.D.   On: 02/19/2015 10:36   I have personally reviewed and evaluated these images and lab results as part of my medical decision-making.   EKG Interpretation None      MDM   Final diagnoses:  Chronic constipation  Intractable vomiting with nausea, vomiting of unspecified type    Abdominal labs including CBC, Lipase, CMP, and UA ordered. Given history of constipation (last BM 1 week ago) with n/v will order acute abdominal series to look for sign of SBO. Fleet enema and Zofran ordered as well.   Cr 5.16,  baseline closer to 4.6-5. Anion gap 17. Pt has had 3 BM since being in the ED so fleet enema held. Pt still with nausea and emesis in the ED. XR with no e/o SBO. There is a large amount of stool. EMR review reveals prior diagnosis of gastroparesis. Will give haldol and reglan.   Pt's nausea improved. Initial 1L NS bolus did not finish as pt states her IV fell out. Will order additional 1L bolus and re-check CMP. If improvement in anion gap will plan to d/c home with rx for reglan and outpatient f/u.   Pt had initially been up for discharge as nausea and vomiting improved, anion gap down to 15. However, upon discharge pt began vomiting again in the ED. At this point pt has received ~2L NS bolus, IV zofran, haldol, and reglan. Will call hospitalists to admit for intractable nausea and vomiting. Will add lactic.  I spoke to Dr. [facility]ron [facility] who will come evaluate patient and admit to medicine.  Anne Ng, PA-C 02/19/15 [place], MD 02/19/15 [phone]

## 2015-02-19 NOTE — ED Notes (Signed)
Awake. Verbally responsive. A/O x4. Resp even and unlabored. No audible adventitious breath sounds noted. ABC's intact.  

## 2015-02-19 NOTE — ED Notes (Signed)
Pt ambulated to BR and returned to room with IV out. Will make PA aware.

## 2015-02-19 NOTE — ED Notes (Signed)
Pt informed of having fleets enema and she reported that her bowels moved while obtaining urine specimen. Pt informed that after receiving flds we can re-eval for BM.

## 2015-02-19 NOTE — ED Notes (Signed)
Patient transported to X-ray and returned to room without distress noted. 

## 2015-02-20 LAB — BASIC METABOLIC PANEL
ANION GAP: 14 (ref 5–15)
BUN: 52 mg/dL — AB (ref 6–20)
CALCIUM: 9.3 mg/dL (ref 8.9–10.3)
CO2: 19 mmol/L — AB (ref 22–32)
Chloride: 112 mmol/L — ABNORMAL HIGH (ref 101–111)
Creatinine, Ser: 5.12 mg/dL — ABNORMAL HIGH (ref 0.44–1.00)
GFR calc Af Amer: 11 mL/min — ABNORMAL LOW (ref >60)
GFR calc non Af Amer: 9 mL/min — ABNORMAL LOW (ref >60)
GLUCOSE: 96 mg/dL (ref 65–99)
POTASSIUM: 3.8 mmol/L (ref 3.5–5.1)
Sodium: 145 mmol/L (ref 135–145)

## 2015-02-20 LAB — GLUCOSE, RANDOM: Glucose, Bld: 473 mg/dL — ABNORMAL HIGH (ref 65–99)

## 2015-02-20 LAB — CBC
HEMATOCRIT: 28.1 % — AB (ref 36.0–46.0)
HEMOGLOBIN: 8.7 g/dL — AB (ref 12.0–15.0)
MCH: 27.4 pg (ref 26.0–34.0)
MCHC: 31 g/dL (ref 30.0–36.0)
MCV: 88.6 fL (ref 78.0–100.0)
Platelets: 243 10*3/uL (ref 150–400)
RBC: 3.17 MIL/uL — ABNORMAL LOW (ref 3.87–5.11)
RDW: 13.9 % (ref 11.5–15.5)
WBC: 8.8 10*3/uL (ref 4.0–10.5)

## 2015-02-20 LAB — GLUCOSE, CAPILLARY
GLUCOSE-CAPILLARY: 462 mg/dL — AB (ref 65–99)
Glucose-Capillary: 97 mg/dL (ref 65–99)
Glucose-Capillary: 111 mg/dL — ABNORMAL HIGH (ref 65–99)

## 2015-02-20 MED ORDER — HYDRALAZINE HCL 20 MG/ML IJ SOLN
10.0000 mg | Freq: Four times a day (QID) | INTRAMUSCULAR | Status: DC | PRN
  Administered 2015-02-20 (×2): 10 mg via INTRAVENOUS
  Filled 2015-02-20 (×2): qty 1

## 2015-02-20 MED ORDER — INSULIN ASPART 100 UNIT/ML ~~LOC~~ SOLN
15.0000 [IU] | Freq: Once | SUBCUTANEOUS | Status: AC
  Administered 2015-02-20: 15 [IU] via SUBCUTANEOUS

## 2015-02-20 NOTE — Discharge Summary (Signed)
Crystal Tanner, is a 45 y.o. female  DOB Dec 11, 1970  MRN [id number].  Admission date:  02/19/2015  Admitting Physician  Albertine Patricia, MD  Discharge Date:  02/20/2015   Primary MD  Minerva Ends, MD  Patient left AMA  Recommendations for primary care physician for things to follow:  - check CBC, BMP, adjust diabetic medication as needed.   Admission Diagnosis  Chronic constipation [K59.00] Intractable vomiting with nausea, vomiting of unspecified type [R11.10]   Discharge Diagnosis  Chronic constipation [K59.00] Intractable vomiting with nausea, vomiting of unspecified type [R11.10]   Active Problems:   Gastritis   CKD (chronic kidney disease)   Diabetes mellitus (HCC)   HTN (hypertension)   Chronic constipation   Nausea & vomiting      Past Medical History  Diagnosis Date  . Hypertension   . Diabetes mellitus without complication ([place]) 3810  . Gastritis   . Chronic kidney disease   . Acute kidney injury ([place])   . Anemia   . Renal cyst, left     History reviewed. No pertinent past surgical history.     History of present illness and  Hospital Course:     Kindly see H&P for history of present illness and admission details, please review complete Labs, Consult reports and Test reports for all details in brief  HPI  from the history and physical done on the day of admission  Crystal Tanner is a 45 y.o. 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; Chronic kidney disease stage 4 baseline creatinine 4.7; chronic anemia and Renal cyst, history of recurrent intractable nausea and vomiting, recently moved to [facility], couple admissions over last couple missions over last few month for nausea and vomiting with history of gastroparesis, reports she's been having few episodes of nausea and vomiting, no oral intake,  symptoms started at midnight, denies coffee-ground emesis, fever, chills, diarrhea, or melena, patient reports chronic constipation, has bowel movement for 3 weeks, reports her symptoms usually presents with severe constipation, so far 5 bowel movements after receiving laxatives in ED, agent received multiple nausea medication, with no significant help, hospitalist requested to admit, KUB significant for including increased stool burden, but no acute findings, no significant labs abnormalities, creatinine is 5.1, baseline is 4.7.   Hospital Course Intractable nausea and vomiting - Patient presents with nausea and vomiting , known history of gastroparesis, as well known history of gastritis, she thinks is related to her gastritis,as well as severe constipation,hel reports usually symptoms improve after good bowel regimen with chronic constipation, atient was given laxatives in ED, had 5-7 bowel movements yesterday. - This a.m. She reports no further nausea and vomiting, did not require any nausea or vomiting medications overnight, advanced to carb modified diet, which he tolerated very well, she was informed about the plan to discharge today, but she left AMA.  Acute on chronic kidney disease stage IV - Baseline creatinine 4.7, today is 5.1, treated with IV fluids  Gastritis -  reviewed with IV Protonix during hospital stay, and resume her by mouth meds on discharge  Diabetes mellitus - CBGs has been controlled during hospital stay, but most recent reading was 462, this is due to the fact she had multiple apple juices, and multiple popsicles, signed AMA,  Hypertension - Uncontrolled, resume home medication on discharge  Chronic constipation - refused further laxatives while inpatient, as encouraged to use over-the-counter medullary lacks and Dulcolax suppository on an as-needed basis       Discharge Condition: signed AMA   Follow UP      Follow-up Information    Follow up with  Minerva Ends, MD. Call in 2 days.   Specialty:  Family Medicine   Contact information:   [place] [address] [phone]       Follow up with [facility]PT.   Specialty:  Emergency Medicine   Why:  If symptoms worsen   Contact information:   [place] [id number] [place] [id number] [phone]        Discharge Instructions  and  Discharge Medications        Medication List    TAKE these medications        acetaminophen 500 MG tablet  Commonly known as:  TYLENOL  Take 1,000 mg by mouth every 6 (six) hours as needed for moderate pain.     cloNIDine 0.3 MG tablet  Commonly known as:  CATAPRES  Take 1 tablet (0.3 mg total) by mouth 2 (two) times daily.     diltiazem 180 MG 24 hr capsule  Commonly known as:  CARDIZEM CD  Take 2 capsules (360 mg total) by mouth daily.     docusate sodium 100 MG capsule  Commonly known as:  COLACE  Take 1 capsule (100 mg total) by mouth 2 (two) times daily as needed for mild constipation.     furosemide 40 MG tablet  Commonly known as:  LASIX  Take 1 tablet (40 mg total) by mouth daily.     glucose blood test strip  Commonly known as:  FREESTYLE TEST STRIPS  Use as instructed     glucose blood test strip  Commonly known as:  TRUE METRIX BLOOD GLUCOSE TEST  Use as instructed     hydrALAZINE 100 MG tablet  Commonly known as:  APRESOLINE  Take 1 tablet (100 mg total) by mouth 3 (three) times daily.     insulin glargine 100 UNIT/ML injection  Commonly known as:  LANTUS  Inject 0.08 mLs (8 Units total) into the skin at bedtime.     metoCLOPramide 10 MG tablet  Commonly known as:  REGLAN  Take 1 tablet (10 mg total) by mouth every 6 (six) hours as needed for nausea or vomiting.     nicotine 21 mg/24hr patch  Commonly known as:  NICODERM CQ - dosed in mg/24 hours  Place 1 patch (21 mg total) onto the skin daily.     NOVOLOG FLEXPEN 100  UNIT/ML FlexPen  Generic drug:  insulin aspart  Inject 1-10 Units into the skin 3 (three) times daily with meals.     polyethylene glycol packet  Commonly known as:  MIRALAX / GLYCOLAX  Take 17 g by mouth daily as needed.     promethazine 25 MG tablet  Commonly known as:  PHENERGAN  Take 1 tablet (25 mg total) by mouth every 8 (eight) hours as needed for nausea or vomiting. During menstrual cycle  TRUE METRIX METER w/Device Kit  1 each by Does not apply route 3 (three) times daily.     TRUEPLUS LANCETS 28G Misc  1 each by Does not apply route 3 (three) times daily.          Diet and Activity recommendation: See Discharge Instructions above   Consults obtained -  none   Major procedures and Radiology Reports - PLEASE review detailed and final reports for all details, in brief -      Dg Abd Acute W/chest  02/19/2015  CLINICAL DATA:  N/V 2 days; hx chronic kidney disease; HTN; SOB due to N/V; EXAM: DG ABDOMEN ACUTE W/ 1V CHEST COMPARISON:  12/18/2014 FINDINGS: There is marked increased stool throughout the colon and in the rectum somewhat increased in extent when compared to the prior study. No evidence of bowel obstruction. No free air. Soft tissues are unremarkable. Cardiac silhouette is mildly enlarged. No mediastinal or hilar masses or evidence of adenopathy. Clear lungs. IMPRESSION: 1. No acute findings.  No evidence of bowel obstruction or free air. 2. Marked increased stool throughout the colon and in the rectum. 3. No acute cardiopulmonary disease. Electronically Signed   By: Lajean Manes M.D.   On: 02/19/2015 10:36    Micro Results     Recent Results (from the past 240 hour(s))  MRSA PCR Screening     Status: None   Collection Time: 02/19/15  6:00 PM  Result Value Ref Range Status   MRSA by PCR NEGATIVE NEGATIVE Final    Comment:        The GeneXpert MRSA Assay (FDA approved for NASAL specimens only), is one component of a comprehensive MRSA  colonization surveillance program. It is not intended to diagnose MRSA infection nor to guide or monitor treatment for MRSA infections.        Today   Subjective:   [place] today has no headache,no chestor abdominal pain,further nausea and vomiting, tolerating good by mouth intake.  Objective:   Blood pressure 176/60, pulse 70, temperature 97.7 F (36.5 C), temperature source Oral, resp. rate 19, height 5' 5"  (1.651 m), weight 81.647 kg (180 lb), last menstrual period 11/11/2014, SpO2 98 %.   Intake/Output Summary (Last 24 hours) at 02/20/15 1250 Last data filed at 02/20/15 1025  Gross per 24 hour  Intake 1153.75 ml  Output      0 ml  Net 1153.75 ml    Exam Awake Alert, Oriented x 3, No new F.N deficits, Normal affect [place].AT,PERRAL Supple Neck,No JVD, No cervical lymphadenopathy appriciated.  Symmetrical Chest wall movement, Good air movement bilaterally, CTAB RRR,No Gallops,Rubs or new Murmurs, No Parasternal Heave +ve B.Sounds, Abd Soft, Non tender, No organomegaly appriciated, No rebound -guarding or rigidity. No Cyanosis, Clubbing or edema, No new Rash or bruise  Data Review   CBC w Diff:  Lab Results  Component Value Date   WBC 8.8 02/20/2015   HGB 8.7* 02/20/2015   HCT 28.1* 02/20/2015   PLT 243 02/20/2015   LYMPHOPCT 18 02/19/2015   MONOPCT 4 02/19/2015   EOSPCT 3 02/19/2015   BASOPCT 1 02/19/2015    CMP:  Lab Results  Component Value Date   NA 145 02/20/2015   K 3.8 02/20/2015   CL 112* 02/20/2015   CO2 19* 02/20/2015   BUN 52* 02/20/2015   CREATININE 5.12* 02/20/2015   CREATININE 4.73* 01/07/2015   PROT 7.6 02/19/2015   ALBUMIN 4.2 02/19/2015   BILITOT 0.8 02/19/2015   ALKPHOS 89  02/19/2015   AST 16 02/19/2015   ALT 9* 02/19/2015  .   Total Time in preparing paper work, data evaluation and todays exam - 35 minutes  Eisley Barber M.D on 02/20/2015 at 12:50 PM  [facility]   Office  [phone]

## 2015-02-20 NOTE — Progress Notes (Signed)
Pt left AMA after cab called to announce arrival at 1230. Encouraged pt to wait and call for another cab til MD could be notified and lab results came back. Pt remained adamant to leave. IV heplock removed. Pt signed AMA form and left via foot to meet cab at front entrance. Dr. Cornell Barman made aware.

## 2015-02-20 NOTE — Clinical Social Work Note (Signed)
CSW received call from MD requesting assistance with getting the patient a ride home. Per RN, patient has left AMA. CSW signing off.   Roddie Mc MSW, [place], [place], [phone]

## 2015-02-20 NOTE — Progress Notes (Signed)
Pt stated "I've called a cab and I'm leaving when it gets here." Pt advised to wait for lab results to come back and that leaving prior to MD discharge order would be against medical advice. Pt adamant to leave when cab arrived.

## 2015-02-20 NOTE — Progress Notes (Signed)
Dr. Randol Kern aware via phone of CBG 462 with orders entered to give 15 Units Novolog SQ. Stat lab for glucose ordered per protocol. Lab technician at bedside. Pt reassured. Pt asymptomatic stating "it's because I ate 4 ice pops and several apple juices."

## 2015-02-25 ENCOUNTER — Encounter: Payer: Self-pay | Admitting: Clinical

## 2015-02-25 NOTE — Progress Notes (Signed)
Depression screen [facility] 2/9 01/07/2015 10/01/2014  Decreased Interest 1 0  Down, Depressed, Hopeless 1 0  PHQ - 2 Score 2 0  Altered sleeping 3 -  Tired, decreased energy 3 -  Change in appetite 3 -  Feeling bad or failure about yourself  0 -  Trouble concentrating 0 -  Moving slowly or fidgety/restless 0 -  Suicidal thoughts 0 -  PHQ-9 Score 11 -    GAD 7 : Generalized Anxiety Score 01/07/2015  Nervous, Anxious, on Edge 0  Control/stop worrying 1  Worry too much - different things 0  Trouble relaxing 0  Restless 0  Easily annoyed or irritable 0  Afraid - awful might happen 1  Total GAD 7 Score 2

## 2015-03-17 ENCOUNTER — Telehealth: Payer: Self-pay | Admitting: Family Medicine

## 2015-03-17 NOTE — Telephone Encounter (Signed)
Patient is experiencing severe swelling on feet and ankles, constipation for over month, no menstrual cycle......Marland Kitchen.please advised patient

## 2015-03-19 ENCOUNTER — Emergency Department (HOSPITAL_COMMUNITY)
Admission: EM | Admit: 2015-03-19 | Discharge: 2015-03-19 | Disposition: A | Discharge status: Home / Self Care | Payer: MEDICAID | Attending: Emergency Medicine | Admitting: Emergency Medicine

## 2015-03-19 ENCOUNTER — Encounter (HOSPITAL_COMMUNITY): Payer: Self-pay | Admitting: Emergency Medicine

## 2015-03-19 DIAGNOSIS — Z794 Long term (current) use of insulin: Secondary | ICD-10-CM | POA: Insufficient documentation

## 2015-03-19 DIAGNOSIS — Z79899 Other long term (current) drug therapy: Secondary | ICD-10-CM | POA: Insufficient documentation

## 2015-03-19 DIAGNOSIS — E119 Type 2 diabetes mellitus without complications: Secondary | ICD-10-CM | POA: Insufficient documentation

## 2015-03-19 DIAGNOSIS — K5900 Constipation, unspecified: Secondary | ICD-10-CM

## 2015-03-19 DIAGNOSIS — I129 Hypertensive chronic kidney disease with stage 1 through stage 4 chronic kidney disease, or unspecified chronic kidney disease: Secondary | ICD-10-CM | POA: Insufficient documentation

## 2015-03-19 DIAGNOSIS — N189 Chronic kidney disease, unspecified: Secondary | ICD-10-CM | POA: Insufficient documentation

## 2015-03-19 DIAGNOSIS — R609 Edema, unspecified: Secondary | ICD-10-CM | POA: Insufficient documentation

## 2015-03-19 DIAGNOSIS — Z862 Personal history of diseases of the blood and blood-forming organs and certain disorders involving the immune mechanism: Secondary | ICD-10-CM | POA: Insufficient documentation

## 2015-03-19 DIAGNOSIS — F17200 Nicotine dependence, unspecified, uncomplicated: Secondary | ICD-10-CM | POA: Insufficient documentation

## 2015-03-19 MED ORDER — SORBITOL 70 % SOLN
960.0000 mL | TOPICAL_OIL | Freq: Once | ORAL | Status: AC
  Administered 2015-03-19: 960 mL via RECTAL
  Filled 2015-03-19: qty 240

## 2015-03-19 MED ORDER — MAGNESIUM CITRATE PO SOLN
1.0000 | Freq: Once | ORAL | Status: AC
  Administered 2015-03-19: 1 via ORAL
  Filled 2015-03-19: qty 296

## 2015-03-19 NOTE — ED Notes (Signed)
Patient reports that she has been constipated x1 month. Reports take 3 bottles of castor oil no relief. States that she "feels horrible, like im 300lbs". Denies nausea/vomiting.

## 2015-03-19 NOTE — ED Notes (Signed)
Patient is resting comfortably. 

## 2015-03-19 NOTE — ED Provider Notes (Signed)
CSN: [id number]     Arrival date & time 03/19/15  1153 History   First MD Initiated Contact with Patient 03/19/15 1232     Chief Complaint  Patient presents with  . Constipation     (Consider location/radiation/quality/duration/timing/severity/associated sxs/prior Treatment) HPI   Jaisa Bowditch is a 45 y.o. female with past medical history of hypertension, diabetes, chronic kidney disease, presenting today with abdominal distention. She states she is constipated for one month. She states she has not had a bowel movement or anything come out of her rectum in the form of stool for one month. She states is normal for her because prior to that it was greater than 1 month until she had a bowel movement Previously. She denies other symptoms. She has no urinary complaints, there has been no vomiting. She has tried Maalox and Castor oil at home without any relief. She denies any history of bowel obstructions. She has no fevers or recent infections, patient has no further complaints.    10 Systems reviewed and are negative for acute change except as noted in the HPI.    Past Medical History  Diagnosis Date  . Hypertension   . Diabetes mellitus without complication ([place]) 7989  . Gastritis   . Chronic kidney disease   . Acute kidney injury ([place])   . Anemia   . Renal cyst, left    History reviewed. No pertinent past surgical history. Family History  Problem Relation Age of Onset  . Hypertension Mother   . Diabetes Mother    Social History  Substance Use Topics  . Smoking status: Current Every Day Smoker  . Smokeless tobacco: Never Used  . Alcohol Use: No     Comment: less than a ppd, "not much"   OB History    Gravida Para Term Preterm AB TAB SAB Ectopic Multiple Living   0 0 0 0 0 0 0 0 0 0      Review of Systems    Allergies  Review of patient's allergies indicates no known allergies.  Home Medications   Prior to Admission medications   Medication Sig Start Date End Date  Taking? Authorizing Provider  acetaminophen (TYLENOL) 500 MG tablet Take 1,000 mg by mouth every 6 (six) hours as needed for moderate pain or headache.    Yes Historical Provider, MD  CASTOR OIL PO Take 1 Bottle by mouth daily as needed (constipation).   Yes Historical Provider, MD  cloNIDine (CATAPRES) 0.3 MG tablet Take 1 tablet (0.3 mg total) by mouth 2 (two) times daily. 10/01/14  Yes Josalyn Funches, MD  diltiazem (CARDIZEM CD) 180 MG 24 hr capsule Take 2 capsules (360 mg total) by mouth daily. 10/01/14  Yes Josalyn Funches, MD  docusate sodium (COLACE) 100 MG capsule Take 1 capsule (100 mg total) by mouth 2 (two) times daily as needed for mild constipation. 01/07/15  Yes Josalyn Funches, MD  furosemide (LASIX) 40 MG tablet Take 1 tablet (40 mg total) by mouth daily. 01/07/15  Yes Josalyn Funches, MD  hydrALAZINE (APRESOLINE) 100 MG tablet Take 1 tablet (100 mg total) by mouth 3 (three) times daily. 10/01/14  Yes Josalyn Funches, MD  insulin aspart (NOVOLOG FLEXPEN) 100 UNIT/ML FlexPen Inject 1-10 Units into the skin 3 (three) times daily as needed for high blood sugar.    Yes Historical Provider, MD  insulin glargine (LANTUS) 100 UNIT/ML injection Inject 0.08 mLs (8 Units total) into the skin at bedtime. 12/20/14  Yes Barton Dubois, MD  metoCLOPramide [facility])  10 MG tablet Take 1 tablet (10 mg total) by mouth every 6 (six) hours as needed for nausea or vomiting. 02/19/15  Yes Olivia Canter Sam, PA-C  polyethylene glycol (MIRALAX / GLYCOLAX) packet Take 17 g by mouth daily as needed. Patient taking differently: Take 17 g by mouth daily as needed for moderate constipation.  01/07/15  Yes Josalyn Funches, MD  promethazine (PHENERGAN) 25 MG tablet Take 1 tablet (25 mg total) by mouth every 8 (eight) hours as needed for nausea or vomiting. During menstrual cycle 01/07/15  Yes Josalyn Funches, MD  Blood Glucose Monitoring Suppl (TRUE METRIX METER) w/Device KIT 1 each by Does not apply route 3 (three) times daily.  01/07/15   Josalyn Funches, MD  glucose blood (FREESTYLE TEST STRIPS) test strip Use as instructed 01/07/15   Josalyn Funches, MD  glucose blood (TRUE METRIX BLOOD GLUCOSE TEST) test strip Use as instructed 01/07/15   Josalyn Funches, MD  nicotine (NICODERM CQ - DOSED IN MG/24 HOURS) 21 mg/24hr patch Place 1 patch (21 mg total) onto the skin daily. Patient not taking: Reported on 01/07/2015 12/20/14   Barton Dubois, MD  TRUEPLUS LANCETS 28G MISC 1 each by Does not apply route 3 (three) times daily. 01/07/15   Josalyn Funches, MD   BP 150/77 mmHg  Pulse 55  Temp(Src) 98.4 F (36.9 C) (Oral)  Resp 16  SpO2 99%  LMP 11/11/2014 Physical Exam  Constitutional: She is oriented to person, place, and time. She appears well-developed and well-nourished. No distress.  HENT:  Head: Normocephalic and atraumatic.  Nose: Nose normal.  Mouth/Throat: Oropharynx is clear and moist. No oropharyngeal exudate.  Eyes: Conjunctivae and EOM are normal. Pupils are equal, round, and reactive to light. No scleral icterus.  Neck: Normal range of motion. Neck supple. No JVD present. No tracheal deviation present. No thyromegaly present.  Cardiovascular: Normal rate, regular rhythm and normal heart sounds.  Exam reveals no gallop and no friction rub.   No murmur heard. Pulmonary/Chest: Effort normal and breath sounds normal. No respiratory distress. She has no wheezes. She exhibits no tenderness.  Abdominal: Soft. Bowel sounds are normal. She exhibits distension. She exhibits no mass. There is no tenderness. There is no rebound and no guarding.  Musculoskeletal: Normal range of motion. She exhibits edema. She exhibits no tenderness.  Lymphadenopathy:    She has no cervical adenopathy.  Neurological: She is alert and oriented to person, place, and time. No cranial nerve deficit. She exhibits normal muscle tone.  Skin: Skin is warm and dry. No rash noted. No erythema. No pallor.  Nursing note and vitals reviewed.   ED  Course  Procedures (including critical care time) Labs Review Labs Reviewed - No data to display  Imaging Review No results found. I have personally reviewed and evaluated these images and lab results as part of my medical decision-making.   EKG Interpretation None      MDM   Final diagnoses:  None    Patient presents to emergency department for constipation 1 month. Will attempt to give her an enema. I do not believe laboratory studies are indicated in this case. Patient will also be sent home with magnesium citrate to take. Primary care follow-up advised.  Patient requests to do enema at home.  This was given to her and also a dose of mag citrate.  She appears well and in NAD.  VS remain within her normal limits and she is safe for DC.  Everlene Balls, MD 03/19/15 2021

## 2015-03-19 NOTE — Discharge Instructions (Signed)
High-Fiber Diet Crystal Tanner, use the diet below to help with constipation.  Take 3-4 caps of miralax daily to help with constipation.  If any symptoms worsen, come back to the ED immediately.. Thank you. Fiber, also called dietary fiber, is a type of carbohydrate found in fruits, vegetables, whole grains, and beans. A high-fiber diet can have many health benefits. Your health care provider may recommend a high-fiber diet to help:  Prevent constipation. Fiber can make your bowel movements more regular.  Lower your cholesterol.  Relieve hemorrhoids, uncomplicated diverticulosis, or irritable bowel syndrome.  Prevent overeating as part of a weight-loss plan.  Prevent heart disease, type 2 diabetes, and certain cancers. WHAT IS MY PLAN? The recommended daily intake of fiber includes:  38 grams for men under age 45.  30 grams for men over age 45.  25 grams for women under age 45.  21 grams for women over age 45. You can get the recommended daily intake of dietary fiber by eating a variety of fruits, vegetables, grains, and beans. Your health care provider may also recommend a fiber supplement if it is not possible to get enough fiber through your diet. WHAT DO I NEED TO KNOW ABOUT A HIGH-FIBER DIET?  Fiber supplements have not been widely studied for their effectiveness, so it is better to get fiber through food sources.  Always check the fiber content on thenutrition facts label of any prepackaged food. Look for foods that contain at least 5 grams of fiber per serving.  Ask your dietitian if you have questions about specific foods that are related to your condition, especially if those foods are not listed in the following section.  Increase your daily fiber consumption gradually. Increasing your intake of dietary fiber too quickly may cause bloating, cramping, or gas.  Drink plenty of water. Water helps you to digest fiber. WHAT FOODS CAN I EAT? Grains Whole-grain breads. Multigrain  cereal. Oats and oatmeal. Brown rice. Barley. Bulgur wheat. Millet. Bran muffins. Popcorn. Rye wafer crackers. Vegetables Sweet potatoes. Spinach. Kale. Artichokes. Cabbage. Broccoli. Green peas. Carrots. Squash. Fruits Berries. Pears. Apples. Oranges. Avocados. Prunes and raisins. Dried figs. Meats and Other Protein Sources Navy, kidney, pinto, and soy beans. Split peas. Lentils. Nuts and seeds. Dairy Fiber-fortified yogurt. Beverages Fiber-fortified soy milk. Fiber-fortified orange juice. Other Fiber bars. The items listed above may not be a complete list of recommended foods or beverages. Contact your dietitian for more options. WHAT FOODS ARE NOT RECOMMENDED? Grains White bread. Pasta made with refined flour. White rice. Vegetables Fried potatoes. Canned vegetables. Well-cooked vegetables.  Fruits Fruit juice. Cooked, strained fruit. Meats and Other Protein Sources Fatty cuts of meat. Fried Environmental education officerpoultry or fried fish. Dairy Milk. Yogurt. Cream cheese. Sour cream. Beverages Soft drinks. Other Cakes and pastries. Butter and oils. The items listed above may not be a complete list of foods and beverages to avoid. Contact your dietitian for more information. WHAT ARE SOME TIPS FOR INCLUDING HIGH-FIBER FOODS IN MY DIET?  Eat a wide variety of high-fiber foods.  Make sure that half of all grains consumed each day are whole grains.  Replace breads and cereals made from refined flour or white flour with whole-grain breads and cereals.  Replace white rice with brown rice, bulgur wheat, or millet.  Start the day with a breakfast that is high in fiber, such as a cereal that contains at least 5 grams of fiber per serving.  Use beans in place of meat in soups, salads, or pasta.  Eat high-fiber snacks, such as berries, raw vegetables, nuts, or popcorn.   This information is not intended to replace advice given to you by your health care provider. Make sure you discuss any questions you  have with your health care provider.   Document Released: 12/18/2004 Document Revised: 01/08/2014 Document Reviewed: 06/02/2013 Elsevier Interactive Patient Education 2016 [employer]. Constipation, Adult Constipation is when a person:  Poops (has a bowel movement) less than 3 times a week.  Has a hard time pooping.  Has poop that is dry, hard, or bigger than normal. HOME CARE   Eat foods with a lot of fiber in them. This includes fruits, vegetables, beans, and whole grains such as brown rice.  Avoid fatty foods and foods with a lot of sugar. This includes french fries, hamburgers, cookies, candy, and soda.  If you are not getting enough fiber from food, take products with added fiber in them (supplements).  Drink enough fluid to keep your pee (urine) clear or pale yellow.  Exercise on a regular basis, or as told by your doctor.  Go to the restroom when you feel like you need to poop. Do not hold it.  Only take medicine as told by your doctor. Do not take medicines that help you poop (laxatives) without talking to your doctor first. GET HELP RIGHT AWAY IF:   You have bright red blood in your poop (stool).  Your constipation lasts more than 4 days or gets worse.  You have belly (abdominal) or butt (rectal) pain.  You have thin poop (as thin as a pencil).  You lose weight, and it cannot be explained. MAKE SURE YOU:   Understand these instructions.  Will watch your condition.  Will get help right away if you are not doing well or get worse.   This information is not intended to replace advice given to you by your health care provider. Make sure you discuss any questions you have with your health care provider.   Document Released: 06/06/2007 Document Revised: 01/08/2014 Document Reviewed: 09/29/2012 Elsevier Interactive Patient Education [employer]

## 2015-03-19 NOTE — ED Notes (Signed)
Request sent to pharmacy to send enema

## 2015-03-20 ENCOUNTER — Encounter (HOSPITAL_COMMUNITY): Payer: Self-pay | Admitting: *Deleted

## 2015-03-20 ENCOUNTER — Emergency Department (HOSPITAL_COMMUNITY)
Admission: EM | Admit: 2015-03-20 | Discharge: 2015-03-20 | Disposition: A | Discharge status: Home / Self Care | Payer: MEDICAID | Attending: Emergency Medicine | Admitting: Emergency Medicine

## 2015-03-20 DIAGNOSIS — I129 Hypertensive chronic kidney disease with stage 1 through stage 4 chronic kidney disease, or unspecified chronic kidney disease: Secondary | ICD-10-CM | POA: Insufficient documentation

## 2015-03-20 DIAGNOSIS — K5900 Constipation, unspecified: Secondary | ICD-10-CM | POA: Insufficient documentation

## 2015-03-20 DIAGNOSIS — E119 Type 2 diabetes mellitus without complications: Secondary | ICD-10-CM | POA: Insufficient documentation

## 2015-03-20 DIAGNOSIS — Z79899 Other long term (current) drug therapy: Secondary | ICD-10-CM | POA: Insufficient documentation

## 2015-03-20 DIAGNOSIS — N189 Chronic kidney disease, unspecified: Secondary | ICD-10-CM | POA: Insufficient documentation

## 2015-03-20 DIAGNOSIS — D649 Anemia, unspecified: Secondary | ICD-10-CM | POA: Insufficient documentation

## 2015-03-20 DIAGNOSIS — Z794 Long term (current) use of insulin: Secondary | ICD-10-CM | POA: Insufficient documentation

## 2015-03-20 DIAGNOSIS — F17200 Nicotine dependence, unspecified, uncomplicated: Secondary | ICD-10-CM | POA: Insufficient documentation

## 2015-03-20 LAB — COMPREHENSIVE METABOLIC PANEL
ALBUMIN: 3.6 g/dL (ref 3.5–5.0)
ALT: 14 U/L (ref 14–54)
AST: 17 U/L (ref 15–41)
Alkaline Phosphatase: 79 U/L (ref 38–126)
Anion gap: 10 (ref 5–15)
BUN: 47 mg/dL — AB (ref 6–20)
CHLORIDE: 118 mmol/L — AB (ref 101–111)
CO2: 18 mmol/L — AB (ref 22–32)
Calcium: 8.6 mg/dL — ABNORMAL LOW (ref 8.9–10.3)
Creatinine, Ser: 4.73 mg/dL — ABNORMAL HIGH (ref 0.44–1.00)
GFR calc Af Amer: 12 mL/min — ABNORMAL LOW (ref >60)
GFR calc non Af Amer: 10 mL/min — ABNORMAL LOW (ref >60)
GLUCOSE: 139 mg/dL — AB (ref 65–99)
POTASSIUM: 4.7 mmol/L (ref 3.5–5.1)
SODIUM: 146 mmol/L — AB (ref 135–145)
Total Bilirubin: 0.6 mg/dL (ref 0.3–1.2)
Total Protein: 6.4 g/dL — ABNORMAL LOW (ref 6.5–8.1)

## 2015-03-20 LAB — CBC WITH DIFFERENTIAL/PLATELET
BASOS ABS: 0 10*3/uL (ref 0.0–0.1)
Basophils Relative: 0 %
Eosinophils Absolute: 0.2 10*3/uL (ref 0.0–0.7)
Eosinophils Relative: 3 %
HEMATOCRIT: 22.2 % — AB (ref 36.0–46.0)
Hemoglobin: 7 g/dL — ABNORMAL LOW (ref 12.0–15.0)
LYMPHS ABS: 0.9 10*3/uL (ref 0.7–4.0)
LYMPHS PCT: 12 %
MCH: 27.7 pg (ref 26.0–34.0)
MCHC: 31.5 g/dL (ref 30.0–36.0)
MCV: 87.7 fL (ref 78.0–100.0)
MONO ABS: 0.3 10*3/uL (ref 0.1–1.0)
Monocytes Relative: 4 %
NEUTROS ABS: 6 10*3/uL (ref 1.7–7.7)
Neutrophils Relative %: 81 %
Platelets: 168 10*3/uL (ref 150–400)
RBC: 2.53 MIL/uL — ABNORMAL LOW (ref 3.87–5.11)
RDW: 14.6 % (ref 11.5–15.5)
WBC: 7.4 10*3/uL (ref 4.0–10.5)

## 2015-03-20 LAB — LIPASE, BLOOD: LIPASE: 19 U/L (ref 11–51)

## 2015-03-20 MED ORDER — ONDANSETRON HCL 4 MG/2ML IJ SOLN
4.0000 mg | Freq: Once | INTRAMUSCULAR | Status: AC
  Administered 2015-03-20: 4 mg via INTRAVENOUS
  Filled 2015-03-20: qty 2

## 2015-03-20 MED ORDER — METOCLOPRAMIDE HCL 5 MG/ML IJ SOLN
10.0000 mg | Freq: Once | INTRAMUSCULAR | Status: AC
  Administered 2015-03-20: 10 mg via INTRAVENOUS
  Filled 2015-03-20: qty 2

## 2015-03-20 MED ORDER — SODIUM CHLORIDE 0.9 % IV BOLUS (SEPSIS)
1000.0000 mL | Freq: Once | INTRAVENOUS | Status: AC
  Administered 2015-03-20: 1000 mL via INTRAVENOUS

## 2015-03-20 NOTE — Discharge Instructions (Signed)
Recommend follow-up with your primary care doctor. Take Reglan as directed

## 2015-03-20 NOTE — ED Notes (Signed)
MD at bedside. 

## 2015-03-20 NOTE — ED Notes (Signed)
Pt arrives to the ER via EMS; pt was seen earlier today and diagnosed with constipation; pt states that she had a BM after an enema at home; pt states that she began having nausea and vomiting after the BM; pt states that she has vomited 6-7 times since the enema

## 2015-03-21 NOTE — Telephone Encounter (Signed)
Pt was at ER this weekend Need F/U with PCP

## 2015-03-22 MED FILL — hydrALAZINE HCL 100 MG TABS: 100 | 30 days supply | Qty: 90 | Fill #5

## 2015-03-22 MED FILL — cloNIDine HCL 0.3 MG TABS: 0.3 | 30 days supply | Qty: 60 | Fill #5

## 2015-03-22 MED FILL — DILTIAZEM 24HR ER 180 MG CA: 180 | 30 days supply | Qty: 60 | Fill #5

## 2015-03-25 ENCOUNTER — Encounter (HOSPITAL_COMMUNITY): Payer: Self-pay | Admitting: Emergency Medicine

## 2015-03-25 ENCOUNTER — Emergency Department (HOSPITAL_COMMUNITY)
Admission: EM | Admit: 2015-03-25 | Discharge: 2015-03-25 | Disposition: A | Discharge status: Home / Self Care | Payer: Self-pay | Attending: Emergency Medicine | Admitting: Emergency Medicine

## 2015-03-25 DIAGNOSIS — F17200 Nicotine dependence, unspecified, uncomplicated: Secondary | ICD-10-CM | POA: Insufficient documentation

## 2015-03-25 DIAGNOSIS — Z794 Long term (current) use of insulin: Secondary | ICD-10-CM | POA: Insufficient documentation

## 2015-03-25 DIAGNOSIS — K5900 Constipation, unspecified: Secondary | ICD-10-CM

## 2015-03-25 DIAGNOSIS — N189 Chronic kidney disease, unspecified: Secondary | ICD-10-CM

## 2015-03-25 DIAGNOSIS — E119 Type 2 diabetes mellitus without complications: Secondary | ICD-10-CM | POA: Insufficient documentation

## 2015-03-25 DIAGNOSIS — I129 Hypertensive chronic kidney disease with stage 1 through stage 4 chronic kidney disease, or unspecified chronic kidney disease: Secondary | ICD-10-CM | POA: Insufficient documentation

## 2015-03-25 DIAGNOSIS — Z79899 Other long term (current) drug therapy: Secondary | ICD-10-CM | POA: Insufficient documentation

## 2015-03-25 DIAGNOSIS — Z862 Personal history of diseases of the blood and blood-forming organs and certain disorders involving the immune mechanism: Secondary | ICD-10-CM | POA: Insufficient documentation

## 2015-03-25 LAB — CBC WITH DIFFERENTIAL/PLATELET
Basophils Absolute: 0 10*3/uL (ref 0.0–0.1)
Basophils Relative: 0 %
EOS ABS: 0.7 10*3/uL (ref 0.0–0.7)
EOS PCT: 13 %
HCT: 23.1 % — ABNORMAL LOW (ref 36.0–46.0)
Hemoglobin: 7.4 g/dL — ABNORMAL LOW (ref 12.0–15.0)
LYMPHS ABS: 1.2 10*3/uL (ref 0.7–4.0)
Lymphocytes Relative: 21 %
MCH: 27.3 pg (ref 26.0–34.0)
MCHC: 32 g/dL (ref 30.0–36.0)
MCV: 85.2 fL (ref 78.0–100.0)
MONOS PCT: 8 %
Monocytes Absolute: 0.4 10*3/uL (ref 0.1–1.0)
Neutro Abs: 3.2 10*3/uL (ref 1.7–7.7)
Neutrophils Relative %: 58 %
PLATELETS: 182 10*3/uL (ref 150–400)
RBC: 2.71 MIL/uL — AB (ref 3.87–5.11)
RDW: 14.7 % (ref 11.5–15.5)
WBC: 5.5 10*3/uL (ref 4.0–10.5)

## 2015-03-25 LAB — BASIC METABOLIC PANEL
Anion gap: 12 (ref 5–15)
BUN: 46 mg/dL — AB (ref 6–20)
CALCIUM: 8.9 mg/dL (ref 8.9–10.3)
CHLORIDE: 114 mmol/L — AB (ref 101–111)
CO2: 19 mmol/L — ABNORMAL LOW (ref 22–32)
CREATININE: 4.68 mg/dL — AB (ref 0.44–1.00)
GFR calc Af Amer: 12 mL/min — ABNORMAL LOW (ref >60)
GFR calc non Af Amer: 10 mL/min — ABNORMAL LOW (ref >60)
Glucose, Bld: 52 mg/dL — ABNORMAL LOW (ref 65–99)
Potassium: 3.9 mmol/L (ref 3.5–5.1)
SODIUM: 145 mmol/L (ref 135–145)

## 2015-03-25 LAB — I-STAT CHEM 8, ED
BUN: 39 mg/dL — ABNORMAL HIGH (ref 6–20)
CALCIUM ION: 1.14 mmol/L (ref 1.12–1.23)
CHLORIDE: 113 mmol/L — AB (ref 101–111)
Creatinine, Ser: 4.6 mg/dL — ABNORMAL HIGH (ref 0.44–1.00)
GLUCOSE: 47 mg/dL — AB (ref 65–99)
HCT: 24 % — ABNORMAL LOW (ref 36.0–46.0)
Hemoglobin: 8.2 g/dL — ABNORMAL LOW (ref 12.0–15.0)
Potassium: 3.8 mmol/L (ref 3.5–5.1)
Sodium: 143 mmol/L (ref 135–145)
TCO2: 18 mmol/L (ref 0–100)

## 2015-03-25 MED ORDER — MAGNESIUM CITRATE PO SOLN: 1.0000 | Freq: Once | ORAL | Status: DC

## 2015-03-25 MED ORDER — SODIUM CHLORIDE 0.9 % IV BOLUS (SEPSIS)
1000.0000 mL | Freq: Once | INTRAVENOUS | Status: AC
  Administered 2015-03-25: 1000 mL via INTRAVENOUS

## 2015-03-25 MED ORDER — ACETAMINOPHEN 325 MG PO TABS
650.0000 mg | ORAL_TABLET | Freq: Once | ORAL | Status: AC
  Administered 2015-03-25: 650 mg via ORAL
  Filled 2015-03-25: qty 2

## 2015-03-25 MED ORDER — SORBITOL 70 % SOLN
960.0000 mL | TOPICAL_OIL | Freq: Once | ORAL | Status: AC
  Administered 2015-03-25: 960 mL via RECTAL
  Filled 2015-03-25: qty 240

## 2015-03-25 NOTE — Discharge Instructions (Signed)
As discussed, your chronic constipation, and discomfort requires additional evaluation, management with specialist providers. Equally important is that you follow-up with our kidney doctors.  Please take all medication as directed, and return here for concerning changes in your condition.

## 2015-03-25 NOTE — ED Notes (Signed)
PT requesting Tylenol before discharge due to headache. Dr.Lock[place] notified and verbal order for Tylenol 650mg  PO given.

## 2015-03-25 NOTE — ED Notes (Signed)
Patient on bedside commode - minimal stool output post enema administration.

## 2015-03-25 NOTE — ED Provider Notes (Signed)
CSN: [id number]     Arrival date & time 03/25/15  0174 History   First MD Initiated Contact with Patient 03/25/15 1146     Chief Complaint  Patient presents with  . Constipation  . Nausea  . Abdominal Pain     (Consider location/radiation/quality/duration/timing/severity/associated sxs/prior Treatment) HPI patient presents with concern of nausea, abdominal discomfort, constipation. She has a long history of constipation, multiple interventions, and multiple occasions, including emergency department, hospitalizations. Patient presents with continued minimal stool production, ongoing abdominal discomfort, nausea, but no vomiting, no fever, no confusion, dislocation, no chest pain. She notes that since her most recent emergency department evaluation one week ago, she has been using oral medication, stool softener, laxative, enema. It is unclear when the last use of enema was, but seems to began several days ago. She continues to have diminished bowel movements. She states that she can go up to a month without a bowel movement.  Past Medical History  Diagnosis Date  . Hypertension   . Diabetes mellitus without complication ([place]) 9449  . Gastritis   . Chronic kidney disease   . Acute kidney injury ([place])   . Anemia   . Renal cyst, left    History reviewed. No pertinent past surgical history. Family History  Problem Relation Age of Onset  . Hypertension Mother   . Diabetes Mother    Social History  Substance Use Topics  . Smoking status: Current Every Day Smoker  . Smokeless tobacco: Never Used  . Alcohol Use: No     Comment: less than a ppd, "not much"   OB History    Gravida Para Term Preterm AB TAB SAB Ectopic Multiple Living   0 0 0 0 0 0 0 0 0 0     Review of Systems  Constitutional:       Per HPI, otherwise negative  HENT:       Per HPI, otherwise negative  Respiratory:       Per HPI, otherwise negative  Cardiovascular:       Per HPI, otherwise negative   Gastrointestinal: Positive for nausea, abdominal pain and constipation. Negative for vomiting.  Endocrine:       Negative aside from HPI  Genitourinary:       Neg aside from HPI   Musculoskeletal:       Per HPI, otherwise negative  Skin: Negative.   Neurological: Negative for syncope.      Allergies  Review of patient's allergies indicates no known allergies.  Home Medications   Prior to Admission medications   Medication Sig Start Date End Date Taking? Authorizing Provider  acetaminophen (TYLENOL) 500 MG tablet Take 1,000 mg by mouth every 6 (six) hours as needed for moderate pain or headache.    Yes Historical Provider, MD  CASTOR OIL PO Take 1 Bottle by mouth daily as needed (constipation).   Yes Historical Provider, MD  cloNIDine (CATAPRES) 0.3 MG tablet Take 1 tablet (0.3 mg total) by mouth 2 (two) times daily. 10/01/14  Yes Josalyn Funches, MD  diltiazem (CARDIZEM CD) 180 MG 24 hr capsule Take 2 capsules (360 mg total) by mouth daily. 10/01/14  Yes Josalyn Funches, MD  docusate sodium (COLACE) 100 MG capsule Take 1 capsule (100 mg total) by mouth 2 (two) times daily as needed for mild constipation. 01/07/15  Yes Josalyn Funches, MD  furosemide (LASIX) 40 MG tablet Take 1 tablet (40 mg total) by mouth daily. 01/07/15  Yes Boykin Nearing, MD  hydrALAZINE (APRESOLINE)  100 MG tablet Take 1 tablet (100 mg total) by mouth 3 (three) times daily. 10/01/14  Yes Josalyn Funches, MD  insulin aspart (NOVOLOG FLEXPEN) 100 UNIT/ML FlexPen Inject 1-10 Units into the skin 3 (three) times daily as needed for high blood sugar.    Yes Historical Provider, MD  insulin glargine (LANTUS) 100 UNIT/ML injection Inject 0.08 mLs (8 Units total) into the skin at bedtime. Patient taking differently: Inject 12 Units into the skin at bedtime.  12/20/14  Yes Barton Dubois, MD  polyethylene glycol [facility] / GLYCOLAX) packet Take 17 g by mouth daily as needed. Patient taking differently: Take 17 g by mouth daily as  needed for moderate constipation.  01/07/15  Yes Boykin Nearing, MD  Probiotic Product (PROBIOTIC PO) Take 1 capsule by mouth daily.   Yes Historical Provider, MD  Blood Glucose Monitoring Suppl (TRUE METRIX METER) w/Device KIT 1 each by Does not apply route 3 (three) times daily. 01/07/15   Josalyn Funches, MD  glucose blood (FREESTYLE TEST STRIPS) test strip Use as instructed 01/07/15   Boykin Nearing, MD  glucose blood (TRUE METRIX BLOOD GLUCOSE TEST) test strip Use as instructed 01/07/15   Boykin Nearing, MD  metoCLOPramide (REGLAN) 10 MG tablet Take 1 tablet (10 mg total) by mouth every 6 (six) hours as needed for nausea or vomiting. 02/19/15   Olivia Canter Sam, PA-C  nicotine (NICODERM CQ - DOSED IN MG/24 HOURS) 21 mg/24hr patch Place 1 patch (21 mg total) onto the skin daily. Patient not taking: Reported on 03/25/2015 12/20/14   Barton Dubois, MD  promethazine (PHENERGAN) 25 MG tablet Take 1 tablet (25 mg total) by mouth every 8 (eight) hours as needed for nausea or vomiting. During menstrual cycle 01/07/15   Boykin Nearing, MD  TRUEPLUS LANCETS 28G MISC 1 each by Does not apply route 3 (three) times daily. 01/07/15   Josalyn Funches, MD   BP 160/60 mmHg  Pulse 46  Temp(Src) 98.3 F (36.8 C) (Oral)  Resp 19  SpO2 99%  LMP 11/11/2014 Physical Exam  Constitutional: She is oriented to person, place, and time. She appears well-developed and well-nourished. No distress.  Patient in no distress, resting on the gurney, with her own blanket.  HENT:  Head: Normocephalic and atraumatic.  Eyes: Conjunctivae and EOM are normal.  Cardiovascular: Normal rate and regular rhythm.   Pulmonary/Chest: Effort normal and breath sounds normal. No stridor. No respiratory distress.  Abdominal: She exhibits no distension. There is no tenderness.  No tenderness with palpation anywhere throughout the abdomen, no distention, no guarding, no rebound.  Musculoskeletal: She exhibits no edema.  Neurological: She is alert and  oriented to person, place, and time. No cranial nerve deficit.  Skin: Skin is warm and dry.  Psychiatric: She has a normal mood and affect.  Nursing note and vitals reviewed.   ED Course  Procedures (including critical care time) Labs Review Labs Reviewed  BASIC METABOLIC PANEL - Abnormal; Notable for the following:    Chloride 114 (*)    CO2 19 (*)    Glucose, Bld 52 (*)    BUN 46 (*)    Creatinine, Ser 4.68 (*)    GFR calc non Af Amer 10 (*)    GFR calc Af Amer 12 (*)    All other components within normal limits  CBC WITH DIFFERENTIAL/PLATELET - Abnormal; Notable for the following:    RBC 2.71 (*)    Hemoglobin 7.4 (*)    HCT 23.1 (*)  All other components within normal limits  I-STAT CHEM 8, ED - Abnormal; Notable for the following:    Chloride 113 (*)    BUN 39 (*)    Creatinine, Ser 4.60 (*)    Glucose, Bld 47 (*)    Hemoglobin 8.2 (*)    HCT 24.0 (*)    All other components within normal limits    I have personally reviewed and evaluated these lab results as part of my medical decision-making.Labs are consistent with prior studies  Chart review notable for documentation of chronic kidney disease, evaluation in one week ago for similar concerns, and hospitalization about one month ago for similar concerns.  3:29 PM Patient has had a large bowel movement.  We discussed all findings, need to follow-up with both GI and nephrology MDM  Patient presents with ongoing constipation. Patient has history of chronic kidney disease, and is likely contributory. Here, the patient actually has a soft, nontender abdomen. Patient had successful stool production after provision of fluids, enema. With findings consistent with chronic kidney disease, chronic constipation, and after production of the aforementioned stool, the patient was appropriate for discharge, management as an outpatient.   Carmin Muskrat, MD 03/25/15 1530

## 2015-03-25 NOTE — ED Notes (Signed)
Pt c/o constipation x 7 months, nausea, abdominal pain, headache. Pt seen twice over weekend for the same. Pt states she took all prescribed medications including enema and followed all discharge instructions with no relief.

## 2015-03-25 NOTE — ED Notes (Signed)
Pt reports understanding of discharge information. No questions at time of discharge 

## 2015-03-25 NOTE — ED Notes (Signed)
Patient has passed a large amount of stool. (3 bedpans full) in last hour.

## 2015-03-25 NOTE — ED Notes (Signed)
Delay in administering SMOG enema due to patient sitting on bedside commode for extended period of time.

## 2015-03-28 ENCOUNTER — Other Ambulatory Visit: Payer: Self-pay | Admitting: Family Medicine

## 2015-03-28 DIAGNOSIS — N184 Chronic kidney disease, stage 4 (severe): Secondary | ICD-10-CM

## 2015-03-28 MED FILL — TRUE METRIX TEST STRIP: 12 days supply | Qty: 50 | Fill #1

## 2015-04-05 ENCOUNTER — Ambulatory Visit: Payer: Medicaid Other | Attending: Family Medicine | Admitting: Family Medicine

## 2015-04-05 ENCOUNTER — Encounter: Payer: Self-pay | Admitting: Family Medicine

## 2015-04-05 VITALS — BP 176/71 | HR 54 | Temp 98.7°F | Resp 16 | Ht 64.0 in | Wt 201.0 lb

## 2015-04-05 DIAGNOSIS — E11649 Type 2 diabetes mellitus with hypoglycemia without coma: Secondary | ICD-10-CM | POA: Diagnosis not present

## 2015-04-05 DIAGNOSIS — Z79899 Other long term (current) drug therapy: Secondary | ICD-10-CM | POA: Diagnosis not present

## 2015-04-05 DIAGNOSIS — E1122 Type 2 diabetes mellitus with diabetic chronic kidney disease: Secondary | ICD-10-CM | POA: Insufficient documentation

## 2015-04-05 DIAGNOSIS — I129 Hypertensive chronic kidney disease with stage 1 through stage 4 chronic kidney disease, or unspecified chronic kidney disease: Secondary | ICD-10-CM | POA: Insufficient documentation

## 2015-04-05 DIAGNOSIS — F17200 Nicotine dependence, unspecified, uncomplicated: Secondary | ICD-10-CM | POA: Insufficient documentation

## 2015-04-05 DIAGNOSIS — K5900 Constipation, unspecified: Secondary | ICD-10-CM

## 2015-04-05 DIAGNOSIS — N184 Chronic kidney disease, stage 4 (severe): Secondary | ICD-10-CM

## 2015-04-05 DIAGNOSIS — K5909 Other constipation: Secondary | ICD-10-CM | POA: Diagnosis present

## 2015-04-05 DIAGNOSIS — I10 Essential (primary) hypertension: Secondary | ICD-10-CM | POA: Diagnosis not present

## 2015-04-05 DIAGNOSIS — Z794 Long term (current) use of insulin: Secondary | ICD-10-CM | POA: Diagnosis not present

## 2015-04-05 DIAGNOSIS — E162 Hypoglycemia, unspecified: Secondary | ICD-10-CM | POA: Insufficient documentation

## 2015-04-05 LAB — GLUCOSE, POCT (MANUAL RESULT ENTRY)
POC GLUCOSE: 38 mg/dL — AB (ref 70–99)
POC GLUCOSE: 89 mg/dL (ref 70–99)

## 2015-04-05 LAB — POCT GLYCOSYLATED HEMOGLOBIN (HGB A1C): Hemoglobin A1C: 5

## 2015-04-05 MED ORDER — SORBITOL 70 % SOLN: 960.0000 mL | TOPICAL_OIL | Freq: Once | ORAL | Status: DC

## 2015-04-05 MED ORDER — MAGNESIUM CITRATE PO SOLN: 1.0000 | Freq: Once | ORAL | Status: DC

## 2015-04-05 NOTE — Progress Notes (Signed)
Subjective:  Patient ID: Berton Mount, female    DOB: Sep 28, 1970  Age: 45 y.o. MRN: [id number]  CC: Constipation   HPI Daney Sen presents has diabetes and HTN she presents  for    1. Constipation: last BM was 03/19/2015. She has been to ED twice for constipation. He last visit was on 03/25/15. She has SMOG enema.  She was hospitalized from 2/18-2/17 for intractable nausea and emesis. She has hx of chronic constipation since child hood.   2. DM2: she is not taking novolog as she is on a sliding scale. She take 12 U of lantus daily.   Social History  Substance Use Topics  . Smoking status: Current Every Day Smoker  . Smokeless tobacco: Never Used  . Alcohol Use: No     Comment: less than a ppd, "not much"    Outpatient Prescriptions Prior to Visit  Medication Sig Dispense Refill  . Blood Glucose Monitoring Suppl (TRUE METRIX METER) w/Device KIT 1 each by Does not apply route 3 (three) times daily. 1 kit 0  . cloNIDine (CATAPRES) 0.3 MG tablet Take 1 tablet (0.3 mg total) by mouth 2 (two) times daily. 60 tablet 5  . diltiazem (CARDIZEM CD) 180 MG 24 hr capsule Take 2 capsules (360 mg total) by mouth daily. 60 capsule 5  . furosemide (LASIX) 40 MG tablet TAKE 1 TABLET BY MOUTH DAILY. 30 tablet 0  . glucose blood (FREESTYLE TEST STRIPS) test strip Use as instructed 100 each 12  . glucose blood (TRUE METRIX BLOOD GLUCOSE TEST) test strip Use as instructed 100 each 12  . insulin aspart (NOVOLOG FLEXPEN) 100 UNIT/ML FlexPen Inject 1-10 Units into the skin 3 (three) times daily as needed for high blood sugar.     . insulin glargine (LANTUS) 100 UNIT/ML injection Inject 0.08 mLs (8 Units total) into the skin at bedtime. (Patient taking differently: Inject 12 Units into the skin at bedtime. )    . polyethylene glycol (MIRALAX / GLYCOLAX) packet Take 17 g by mouth daily as needed. 30 each 2  . Probiotic Product (PROBIOTIC PO) Take 1 capsule by mouth daily.    . TRUEPLUS LANCETS 28G MISC 1  each by Does not apply route 3 (three) times daily. 100 each 11  . acetaminophen (TYLENOL) 500 MG tablet Take 1,000 mg by mouth every 6 (six) hours as needed for moderate pain or headache. Reported on 04/05/2015    . hydrALAZINE (APRESOLINE) 100 MG tablet Take 1 tablet (100 mg total) by mouth 3 (three) times daily. (Patient not taking: Reported on 04/05/2015) 90 tablet 5  . magnesium citrate SOLN Take 296 mLs (1 Bottle total) by mouth once. (Patient not taking: Reported on 04/05/2015) 195 mL 2  . metoCLOPramide (REGLAN) 10 MG tablet Take 1 tablet (10 mg total) by mouth every 6 (six) hours as needed for nausea or vomiting. (Patient not taking: Reported on 04/05/2015) 30 tablet 0  . nicotine (NICODERM CQ - DOSED IN MG/24 HOURS) 21 mg/24hr patch Place 1 patch (21 mg total) onto the skin daily. (Patient not taking: Reported on 03/25/2015) 28 patch 0  . promethazine (PHENERGAN) 25 MG tablet Take 1 tablet (25 mg total) by mouth every 8 (eight) hours as needed for nausea or vomiting. During menstrual cycle (Patient not taking: Reported on 04/05/2015) 20 tablet 0   No facility-administered medications prior to visit.    ROS Review of Systems  Constitutional: Negative for fever and chills.  Eyes: Negative for visual disturbance.  Respiratory: Negative for shortness of breath.   Cardiovascular: Negative for chest pain.  Gastrointestinal: Positive for nausea, constipation and abdominal distention. Negative for vomiting, abdominal pain, diarrhea, blood in stool, anal bleeding and rectal pain.  Musculoskeletal: Negative for back pain and arthralgias.  Skin: Negative for rash.  Allergic/Immunologic: Negative for immunocompromised state.  Hematological: Negative for adenopathy. Does not bruise/bleed easily.  Psychiatric/Behavioral: Negative for suicidal ideas and dysphoric mood.    Objective:  BP 176/71 mmHg  Pulse 54  Temp(Src) 98.7 F (37.1 C) (Oral)  Resp 16  Ht 5' 4"  (1.626 m)  Wt 201 lb (91.173 kg)  BMI  34.48 kg/m2  SpO2 99%  LMP 11/11/2014  BP/Weight 04/05/2015 03/25/2015 04/19/3788  Systolic BP 240 973 532  Diastolic BP 71 78 78  Wt. (Lbs) 201 - -  BMI 34.48 - -   Physical Exam  Constitutional: She is oriented to person, place, and time. She appears well-developed and well-nourished. No distress.  HENT:  Head: Normocephalic and atraumatic.  Cardiovascular: Normal rate, regular rhythm, normal heart sounds and intact distal pulses.   Pulmonary/Chest: Effort normal and breath sounds normal.  Abdominal: Bowel sounds are normal. She exhibits distension. She exhibits no mass. There is tenderness. There is no rebound and no guarding.    Musculoskeletal: She exhibits no edema.  Neurological: She is alert and oriented to person, place, and time.  Skin: Skin is warm and dry. No rash noted.  Psychiatric: She has a normal mood and affect.   CBG 38 Treated with oral glucose solution Repeat CBG 89 Lab Results  Component Value Date   HGBA1C 6.9* 12/18/2014   Assessment & Plan:   Maurine was seen today for constipation.  Diagnoses and all orders for this visit:  Type 2 diabetes mellitus with stage 4 chronic kidney disease, with long-term current use of insulin (HCC) -     POCT glycosylated hemoglobin (Hb A1C) -     POCT glucose (manual entry)  CKD (chronic kidney disease), stage 4 (severe) (HCC) -     Cancel: Basic Metabolic Panel -     Ambulatory referral to Nephrology  Chronic constipation -     Discontinue: magnesium citrate SOLN; Take 296 mLs (1 Bottle total) by mouth once. -     Ambulatory referral to Gastroenterology -     magnesium citrate SOLN; Take 296 mLs (1 Bottle total) by mouth once. -     sorbitol-magnesium hydroxide-mineral oil-glycerin; Place 960 mLs rectally once.  Hypoglycemia -     Glucose (CBG)   No orders of the defined types were placed in this encounter.    Follow-up: No Follow-up on file.   Boykin Nearing MD

## 2015-04-05 NOTE — Progress Notes (Signed)
F/U constipation  Low glucose today, Pt asymptomatic  Taking 12 unit Lantus daily  No pain today  Tobacco user 2 cigarette per day  No suicidal thoughts in the past two weeks

## 2015-04-05 NOTE — Patient Instructions (Addendum)
Crystal Tanner was seen today for constipation.  Diagnoses and all orders for this visit:  Type 2 diabetes mellitus with stage 4 chronic kidney disease, with long-term current use of insulin (HCC) -     POCT glycosylated hemoglobin (Hb A1C) -     POCT glucose (manual entry)  CKD (chronic kidney disease), stage 4 (severe) (HCC) -     Cancel: Basic Metabolic Panel -     Ambulatory referral to Nephrology  Chronic constipation -     magnesium citrate SOLN; Take 296 mLs (1 Bottle total) by mouth once. -     Ambulatory referral to Gastroenterology   Your A1c is 5.9 today. This is very normal. Please do not take novolog. I advise decreasing lantus to the prescribed 8 U.   F/u in 2 weeks for HTN and constipation  Dr. Armen PickupFunches

## 2015-04-06 ENCOUNTER — Encounter: Payer: Self-pay | Admitting: Family Medicine

## 2015-04-06 ENCOUNTER — Telehealth: Payer: Self-pay | Admitting: Family Medicine

## 2015-04-06 NOTE — Telephone Encounter (Signed)
Pharmacy need clarification on ML for each ingredient.Marland Kitchen.Marland Kitchen.Marland Kitchen.Marland Kitchen.[facility]

## 2015-04-06 NOTE — Assessment & Plan Note (Addendum)
Chronic constipation SMOG enema  GI referral

## 2015-04-06 NOTE — Assessment & Plan Note (Signed)
Controlled diabetes with hypoglycemia  Dc novolog Decrease lantus to 8 U with plan to taper to off

## 2015-04-07 NOTE — Telephone Encounter (Signed)
Called back to patient's pharmacy. Each element for SMOG enema was one part each.

## 2015-04-08 NOTE — ED Provider Notes (Signed)
CSN: [id number]     Arrival date & time 03/20/15  0451 History   First MD Initiated Contact with Patient 03/20/15 0703     Chief Complaint  Patient presents with  . Emesis     (Consider location/radiation/quality/duration/timing/severity/associated sxs/prior Treatment) HPI.... Patient presents with constipation for several months. She was evaluated earlier the emergency department diagnosed with constipation. She had a bowel movement after an enema at home. She has had nausea and vomiting. No fever, sweats, chills, dysuria, chest pain, dyspnea  Past Medical History  Diagnosis Date  . Hypertension   . Diabetes mellitus without complication ([place]) 9449  . Gastritis   . Chronic kidney disease   . Acute kidney injury ([place])   . Anemia   . Renal cyst, left    History reviewed. No pertinent past surgical history. Family History  Problem Relation Age of Onset  . Hypertension Mother   . Diabetes Mother    Social History  Substance Use Topics  . Smoking status: Current Every Day Smoker  . Smokeless tobacco: Never Used  . Alcohol Use: No     Comment: less than a ppd, "not much"   OB History    Gravida Para Term Preterm AB TAB SAB Ectopic Multiple Living   0 0 0 0 0 0 0 0 0 0      Review of Systems  All other systems reviewed and are negative.     Allergies  Review of patient's allergies indicates no known allergies.  Home Medications   Prior to Admission medications   Medication Sig Start Date End Date Taking? Authorizing Provider  acetaminophen (TYLENOL) 500 MG tablet Take 1,000 mg by mouth every 6 (six) hours as needed for moderate pain or headache. Reported on 04/05/2015   Yes Historical Provider, MD  cloNIDine (CATAPRES) 0.3 MG tablet Take 1 tablet (0.3 mg total) by mouth 2 (two) times daily. 10/01/14  Yes Josalyn Funches, MD  diltiazem (CARDIZEM CD) 180 MG 24 hr capsule Take 2 capsules (360 mg total) by mouth daily. 10/01/14  Yes Josalyn Funches, MD  hydrALAZINE  (APRESOLINE) 100 MG tablet Take 1 tablet (100 mg total) by mouth 3 (three) times daily. Patient not taking: Reported on 04/05/2015 10/01/14  Yes Josalyn Funches, MD  insulin glargine (LANTUS) 100 UNIT/ML injection Inject 0.08 mLs (8 Units total) into the skin at bedtime. Patient taking differently: Inject 12 Units into the skin at bedtime.  12/20/14  Yes Barton Dubois, MD  metoCLOPramide (REGLAN) 10 MG tablet Take 1 tablet (10 mg total) by mouth every 6 (six) hours as needed for nausea or vomiting. Patient not taking: Reported on 04/05/2015 02/19/15  Yes Olivia Canter Sam, PA-C  nicotine (NICODERM CQ - DOSED IN MG/24 HOURS) 21 mg/24hr patch Place 1 patch (21 mg total) onto the skin daily. Patient not taking: Reported on 03/25/2015 12/20/14  Yes Barton Dubois, MD  polyethylene glycol [facility] / GLYCOLAX) packet Take 17 g by mouth daily as needed. 01/07/15  Yes Josalyn Funches, MD  promethazine (PHENERGAN) 25 MG tablet Take 1 tablet (25 mg total) by mouth every 8 (eight) hours as needed for nausea or vomiting. During menstrual cycle Patient not taking: Reported on 04/05/2015 01/07/15  Yes Josalyn Funches, MD  TRUEPLUS LANCETS 28G MISC 1 each by Does not apply route 3 (three) times daily. 01/07/15  Yes Josalyn Funches, MD  Blood Glucose Monitoring Suppl (TRUE METRIX METER) w/Device KIT 1 each by Does not apply route 3 (three) times daily. 01/07/15   Josalyn  Funches, MD  furosemide (LASIX) 40 MG tablet TAKE 1 TABLET BY MOUTH DAILY. 03/28/15   Josalyn Funches, MD  glucose blood (FREESTYLE TEST STRIPS) test strip Use as instructed 01/07/15   Boykin Nearing, MD  glucose blood (TRUE METRIX BLOOD GLUCOSE TEST) test strip Use as instructed 01/07/15   Boykin Nearing, MD  Probiotic Product (PROBIOTIC PO) Take 1 capsule by mouth daily.    Historical Provider, MD  sorbitol-magnesium hydroxide-mineral oil-glycerin Place 960 mLs rectally once. 04/05/15   Josalyn Funches, MD   BP 195/78 mmHg  Pulse 51  Temp(Src) 98 F (36.7 C) (Oral)   Resp 16  SpO2 100%  LMP 11/11/2014 Physical Exam  Constitutional: She is oriented to person, place, and time. She appears well-developed and well-nourished.  HENT:  Head: Normocephalic and atraumatic.  Eyes: Conjunctivae and EOM are normal. Pupils are equal, round, and reactive to light.  Neck: Normal range of motion. Neck supple.  Cardiovascular: Normal rate and regular rhythm.   Pulmonary/Chest: Effort normal and breath sounds normal.  Abdominal: Soft. Bowel sounds are normal.  No acute abd  Musculoskeletal: Normal range of motion.  Neurological: She is alert and oriented to person, place, and time.  Skin: Skin is warm and dry.  Psychiatric: She has a normal mood and affect. Her behavior is normal.  Nursing note and vitals reviewed.   ED Course  Procedures (including critical care time) Labs Review Labs Reviewed  COMPREHENSIVE METABOLIC PANEL - Abnormal; Notable for the following:    Sodium 146 (*)    Chloride 118 (*)    CO2 18 (*)    Glucose, Bld 139 (*)    BUN 47 (*)    Creatinine, Ser 4.73 (*)    Calcium 8.6 (*)    Total Protein 6.4 (*)    GFR calc non Af Amer 10 (*)    GFR calc Af Amer 12 (*)    All other components within normal limits  CBC WITH DIFFERENTIAL/PLATELET - Abnormal; Notable for the following:    RBC 2.53 (*)    Hemoglobin 7.0 (*)    HCT 22.2 (*)    All other components within normal limits  LIPASE, BLOOD    Imaging Review No results found. I have personally reviewed and evaluated these images and lab results as part of my medical decision-making.   EKG Interpretation None      MDM   Final diagnoses:  CKD (chronic kidney disease), unspecified stage  Constipation, unspecified constipation type  Anemia, unspecified anemia type   Patient is hemodynamically stable. She feels better after IV fluids, IV Reglan, IV Zofran. She has primary care follow-up.    Nat Christen, MD 04/08/15 [phone]

## 2015-04-25 ENCOUNTER — Other Ambulatory Visit: Payer: Self-pay | Admitting: Family Medicine

## 2015-04-26 ENCOUNTER — Other Ambulatory Visit: Payer: Self-pay | Admitting: *Deleted

## 2015-04-26 ENCOUNTER — Telehealth: Payer: Self-pay | Admitting: *Deleted

## 2015-04-26 DIAGNOSIS — N184 Chronic kidney disease, stage 4 (severe): Secondary | ICD-10-CM

## 2015-04-26 DIAGNOSIS — I10 Essential (primary) hypertension: Secondary | ICD-10-CM

## 2015-04-26 MED ORDER — DILTIAZEM HCL ER COATED BEADS 180 MG PO CP24: 360.0000 mg | ORAL_CAPSULE | Freq: Every day | ORAL | Status: DC

## 2015-04-26 MED ORDER — FUROSEMIDE 40 MG PO TABS: 40.0000 mg | ORAL_TABLET | Freq: Every day | ORAL | Status: DC

## 2015-04-26 MED ORDER — INSULIN GLARGINE 100 UNIT/ML ~~LOC~~ SOLN: 8.0000 [IU] | Freq: Every day | SUBCUTANEOUS | Status: DC

## 2015-04-26 MED ORDER — CLONIDINE HCL 0.3 MG PO TABS: 0.3000 mg | ORAL_TABLET | Freq: Two times a day (BID) | ORAL | Status: DC

## 2015-04-26 MED ORDER — HYDRALAZINE HCL 100 MG PO TABS: 100.0000 mg | ORAL_TABLET | Freq: Three times a day (TID) | ORAL | Status: DC

## 2015-04-26 MED FILL — dilTIAZem CD 180 MG CP24: 180 | 30 days supply | Qty: 60 | Fill #0

## 2015-04-26 MED FILL — cloNIDine HCL 0.3 MG TABS: 0.3 | 30 days supply | Qty: 60 | Fill #0

## 2015-04-26 MED FILL — hydrALAZINE HCL 100 MG TABS: 100 | 30 days supply | Qty: 90 | Fill #0

## 2015-04-26 MED FILL — ?FUROSEMIDE 40 MG TABLET: 40 | 30 days supply | Qty: 30 | Fill #0

## 2015-04-26 NOTE — Telephone Encounter (Signed)
Pt requesting Apresoline  Refills

## 2015-04-26 NOTE — Telephone Encounter (Signed)
lantus and hydralazine refilled alprazolam is not on med list, this is xanax, usually prescribed for panic attacks.  F/u appt needed if patient having anxiety

## 2015-04-26 NOTE — Telephone Encounter (Signed)
Pt requesting Lantus and Amprozolam refills

## 2015-04-27 ENCOUNTER — Other Ambulatory Visit: Payer: Self-pay | Admitting: *Deleted

## 2015-04-27 ENCOUNTER — Emergency Department (HOSPITAL_COMMUNITY): Payer: Medicaid Other

## 2015-04-27 ENCOUNTER — Observation Stay (HOSPITAL_COMMUNITY)
Admission: EM | Admit: 2015-04-27 | Discharge: 2015-04-29 | Disposition: A | Discharge status: Home / Self Care | Payer: Medicaid Other | Attending: Internal Medicine | Admitting: Internal Medicine

## 2015-04-27 ENCOUNTER — Encounter (HOSPITAL_COMMUNITY): Payer: Self-pay | Admitting: Emergency Medicine

## 2015-04-27 DIAGNOSIS — N946 Dysmenorrhea, unspecified: Secondary | ICD-10-CM

## 2015-04-27 DIAGNOSIS — K3184 Gastroparesis: Secondary | ICD-10-CM | POA: Diagnosis not present

## 2015-04-27 DIAGNOSIS — Z794 Long term (current) use of insulin: Secondary | ICD-10-CM | POA: Insufficient documentation

## 2015-04-27 DIAGNOSIS — D631 Anemia in chronic kidney disease: Secondary | ICD-10-CM | POA: Diagnosis not present

## 2015-04-27 DIAGNOSIS — I120 Hypertensive chronic kidney disease with stage 5 chronic kidney disease or end stage renal disease: Secondary | ICD-10-CM | POA: Insufficient documentation

## 2015-04-27 DIAGNOSIS — E872 Acidosis, unspecified: Secondary | ICD-10-CM

## 2015-04-27 DIAGNOSIS — K567 Ileus, unspecified: Secondary | ICD-10-CM

## 2015-04-27 DIAGNOSIS — Z87891 Personal history of nicotine dependence: Secondary | ICD-10-CM | POA: Diagnosis not present

## 2015-04-27 DIAGNOSIS — I150 Renovascular hypertension: Secondary | ICD-10-CM | POA: Diagnosis present

## 2015-04-27 DIAGNOSIS — N189 Chronic kidney disease, unspecified: Secondary | ICD-10-CM

## 2015-04-27 DIAGNOSIS — N179 Acute kidney failure, unspecified: Secondary | ICD-10-CM | POA: Diagnosis not present

## 2015-04-27 DIAGNOSIS — K5909 Other constipation: Secondary | ICD-10-CM

## 2015-04-27 DIAGNOSIS — K2970 Gastritis, unspecified, without bleeding: Secondary | ICD-10-CM

## 2015-04-27 DIAGNOSIS — R109 Unspecified abdominal pain: Secondary | ICD-10-CM | POA: Diagnosis present

## 2015-04-27 DIAGNOSIS — N185 Chronic kidney disease, stage 5: Secondary | ICD-10-CM | POA: Diagnosis not present

## 2015-04-27 DIAGNOSIS — E1143 Type 2 diabetes mellitus with diabetic autonomic (poly)neuropathy: Secondary | ICD-10-CM | POA: Insufficient documentation

## 2015-04-27 DIAGNOSIS — R112 Nausea with vomiting, unspecified: Secondary | ICD-10-CM | POA: Diagnosis present

## 2015-04-27 DIAGNOSIS — R1110 Vomiting, unspecified: Secondary | ICD-10-CM | POA: Insufficient documentation

## 2015-04-27 DIAGNOSIS — E11649 Type 2 diabetes mellitus with hypoglycemia without coma: Secondary | ICD-10-CM | POA: Diagnosis not present

## 2015-04-27 DIAGNOSIS — E1122 Type 2 diabetes mellitus with diabetic chronic kidney disease: Secondary | ICD-10-CM | POA: Diagnosis not present

## 2015-04-27 DIAGNOSIS — K5900 Constipation, unspecified: Secondary | ICD-10-CM | POA: Diagnosis not present

## 2015-04-27 DIAGNOSIS — E162 Hypoglycemia, unspecified: Secondary | ICD-10-CM

## 2015-04-27 DIAGNOSIS — F17200 Nicotine dependence, unspecified, uncomplicated: Secondary | ICD-10-CM

## 2015-04-27 HISTORY — DX: Constipation, unspecified: K59.00

## 2015-04-27 LAB — COMPREHENSIVE METABOLIC PANEL
ALBUMIN: 4 g/dL (ref 3.5–5.0)
ALK PHOS: 99 U/L (ref 38–126)
ALT: 11 U/L — AB (ref 14–54)
ANION GAP: 12 (ref 5–15)
AST: 14 U/L — ABNORMAL LOW (ref 15–41)
BILIRUBIN TOTAL: 0.6 mg/dL (ref 0.3–1.2)
BUN: 42 mg/dL — ABNORMAL HIGH (ref 6–20)
CO2: 16 mmol/L — ABNORMAL LOW (ref 22–32)
Calcium: 9 mg/dL (ref 8.9–10.3)
Chloride: 112 mmol/L — ABNORMAL HIGH (ref 101–111)
Creatinine, Ser: 5 mg/dL — ABNORMAL HIGH (ref 0.44–1.00)
GFR calc Af Amer: 11 mL/min — ABNORMAL LOW (ref >60)
GFR calc non Af Amer: 10 mL/min — ABNORMAL LOW (ref >60)
GLUCOSE: 80 mg/dL (ref 65–99)
Potassium: 4.6 mmol/L (ref 3.5–5.1)
Sodium: 140 mmol/L (ref 135–145)
Total Protein: 7.3 g/dL (ref 6.5–8.1)

## 2015-04-27 LAB — CBG MONITORING, ED
GLUCOSE-CAPILLARY: 31 mg/dL — AB (ref 65–99)
GLUCOSE-CAPILLARY: 159 mg/dL — AB (ref 65–99)
Glucose-Capillary: 61 mg/dL — ABNORMAL LOW (ref 65–99)

## 2015-04-27 LAB — CBC
HEMATOCRIT: 26.4 % — AB (ref 36.0–46.0)
HEMOGLOBIN: 8.6 g/dL — AB (ref 12.0–15.0)
MCH: 27.1 pg (ref 26.0–34.0)
MCHC: 32.6 g/dL (ref 30.0–36.0)
MCV: 83.3 fL (ref 78.0–100.0)
Platelets: 221 10*3/uL (ref 150–400)
RBC: 3.17 MIL/uL — AB (ref 3.87–5.11)
RDW: 14.5 % (ref 11.5–15.5)
WBC: 5.5 10*3/uL (ref 4.0–10.5)

## 2015-04-27 LAB — URINALYSIS, ROUTINE W REFLEX MICROSCOPIC
BILIRUBIN URINE: NEGATIVE
GLUCOSE, UA: NEGATIVE mg/dL
HGB URINE DIPSTICK: NEGATIVE
Ketones, ur: NEGATIVE mg/dL
Leukocytes, UA: NEGATIVE
Nitrite: NEGATIVE
PH: 5 (ref 5.0–8.0)
Protein, ur: >300 mg/dL — AB
SPECIFIC GRAVITY, URINE: 1.015 (ref 1.005–1.030)

## 2015-04-27 LAB — LIPASE, BLOOD: Lipase: 12 U/L (ref 11–51)

## 2015-04-27 LAB — URINE MICROSCOPIC-ADD ON: RBC / HPF: NONE SEEN RBC/hpf (ref 0–5)

## 2015-04-27 LAB — I-STAT BETA HCG BLOOD, ED (MC, WL, AP ONLY)

## 2015-04-27 MED ORDER — INSULIN GLARGINE 100 UNIT/ML ~~LOC~~ SOLN: 8.0000 [IU] | Freq: Every day | SUBCUTANEOUS | Status: DC

## 2015-04-27 MED ORDER — SODIUM CHLORIDE 0.9 % IV BOLUS (SEPSIS)
500.0000 mL | Freq: Once | INTRAVENOUS | Status: AC
  Administered 2015-04-27: 500 mL via INTRAVENOUS

## 2015-04-27 MED ORDER — SORBITOL 70 % SOLN
960.0000 mL | TOPICAL_OIL | Freq: Once | ORAL | Status: AC
  Administered 2015-04-27: 960 mL via RECTAL
  Filled 2015-04-27: qty 240

## 2015-04-27 MED ORDER — FLEET ENEMA 7-19 GM/118ML RE ENEM
1.0000 | ENEMA | Freq: Once | RECTAL | Status: DC
  Filled 2015-04-27: qty 1

## 2015-04-27 MED ORDER — ONDANSETRON HCL 4 MG/2ML IJ SOLN
4.0000 mg | Freq: Once | INTRAMUSCULAR | Status: AC
  Administered 2015-04-27: 4 mg via INTRAVENOUS
  Filled 2015-04-27: qty 2

## 2015-04-27 MED FILL — !LANTUS SOLOSTAR 100UNITS/M: 100 | 28 days supply | Qty: 3 | Fill #0

## 2015-04-27 NOTE — ED Notes (Signed)
Nurse starting IV, drawing labs 

## 2015-04-27 NOTE — ED Provider Notes (Signed)
CSN: [id number]     Arrival date & time 04/27/15  1047 History   First MD Initiated Contact with Patient 04/27/15 1506     Chief Complaint  Patient presents with  . Constipation  . Hypoglycemia     (Consider location/radiation/quality/duration/timing/severity/associated sxs/prior Treatment) HPI Comments: Patient with a history of hypertension diabetes and chronic kidney disease presents with constipation. She states she has an ongoing problem with constipation. She states she hasn't had a bowel movement since March 13. She denies any vomiting. She's had a little bit of intermittent nausea. She denies abdominal pain. She feels like her abdomen is bloated. She denies any fevers. No urinary symptoms. She has tried castor oil without relief. Also of note, she noted her blood sugar to be low today. Her sugar on arrival was in the 30s. She was given orange juice and improved in the 60s. She is on Lantus and sliding scale insulin. She states that she hasn't eaten anything in 2 days due to the constipation.  Patient is a 45 y.o. female presenting with constipation and hypoglycemia.  Constipation Associated symptoms: nausea   Associated symptoms: no abdominal pain, no back pain, no diarrhea, no fever and no vomiting   Hypoglycemia Associated symptoms: no dizziness, no shortness of breath, no speech difficulty, no sweats, no vomiting and no weakness     Past Medical History  Diagnosis Date  . Hypertension   . Diabetes mellitus without complication ([address]) 4492  . Gastritis   . Chronic kidney disease   . Acute kidney injury ([place])   . Anemia   . Renal cyst, left   . Constipation    History reviewed. No pertinent past surgical history. Family History  Problem Relation Age of Onset  . Hypertension Mother   . Diabetes Mother    Social History  Substance Use Topics  . Smoking status: Former Smoker    Quit date: 02/26/2015  . Smokeless tobacco: Never Used  . Alcohol Use: No     Comment: less  than a ppd, "not much"   OB History    Gravida Para Term Preterm AB TAB SAB Ectopic Multiple Living   0 0 0 0 0 0 0 0 0 0      Review of Systems  Constitutional: Negative for fever, chills, diaphoresis and fatigue.  HENT: Negative for congestion, rhinorrhea and sneezing.   Eyes: Negative.   Respiratory: Negative for cough, chest tightness and shortness of breath.   Cardiovascular: Negative for chest pain and leg swelling.  Gastrointestinal: Positive for nausea and constipation. Negative for vomiting, abdominal pain, diarrhea and blood in stool.  Genitourinary: Negative for frequency, hematuria, flank pain and difficulty urinating.  Musculoskeletal: Negative for back pain and arthralgias.  Skin: Negative for rash.  Neurological: Negative for dizziness, speech difficulty, weakness, numbness and headaches.      Allergies  Review of patient's allergies indicates no known allergies.  Home Medications   Prior to Admission medications   Medication Sig Start Date End Date Taking? Authorizing Provider  acetaminophen (TYLENOL) 500 MG tablet Take 1,000 mg by mouth every 6 (six) hours as needed for moderate pain or headache. Reported on 04/05/2015   Yes Historical Provider, MD  cloNIDine (CATAPRES) 0.3 MG tablet Take 1 tablet (0.3 mg total) by mouth 2 (two) times daily. 04/26/15  Yes Josalyn Funches, MD  diltiazem (CARDIZEM CD) 180 MG 24 hr capsule Take 2 capsules (360 mg total) by mouth daily. 04/26/15  Yes Boykin Nearing, MD  furosemide (LASIX) 40  MG tablet Take 1 tablet (40 mg total) by mouth daily. 04/26/15  Yes Josalyn Funches, MD  hydrALAZINE (APRESOLINE) 100 MG tablet Take 1 tablet (100 mg total) by mouth 3 (three) times daily. 04/26/15  Yes Josalyn Funches, MD  insulin glargine (LANTUS) 100 UNIT/ML injection Inject 0.08 mLs (8 Units total) into the skin at bedtime. 04/27/15  Yes Josalyn Funches, MD  polyethylene glycol (MIRALAX / GLYCOLAX) packet Take 17 g by mouth daily as needed. Patient  taking differently: Take 17 g by mouth daily as needed for mild constipation.  01/07/15  Yes Boykin Nearing, MD  Probiotic Product (PROBIOTIC PO) Take 1 capsule by mouth daily.   Yes Historical Provider, MD  Blood Glucose Monitoring Suppl (TRUE METRIX METER) w/Device KIT 1 each by Does not apply route 3 (three) times daily. 01/07/15   Josalyn Funches, MD  glucose blood (FREESTYLE TEST STRIPS) test strip Use as instructed 01/07/15   Boykin Nearing, MD  glucose blood (TRUE METRIX BLOOD GLUCOSE TEST) test strip Use as instructed 01/07/15   Boykin Nearing, MD  metoCLOPramide (REGLAN) 10 MG tablet Take 1 tablet (10 mg total) by mouth every 6 (six) hours as needed for nausea or vomiting. Patient not taking: Reported on 04/05/2015 02/19/15   Olivia Canter Sam, PA-C  nicotine (NICODERM CQ - DOSED IN MG/24 HOURS) 21 mg/24hr patch Place 1 patch (21 mg total) onto the skin daily. Patient not taking: Reported on 03/25/2015 12/20/14   Barton Dubois, MD  promethazine (PHENERGAN) 25 MG tablet Take 1 tablet (25 mg total) by mouth every 8 (eight) hours as needed for nausea or vomiting. During menstrual cycle Patient not taking: Reported on 04/05/2015 01/07/15   Boykin Nearing, MD  sorbitol-magnesium hydroxide-mineral oil-glycerin Place 960 mLs rectally once. Patient not taking: Reported on 04/27/2015 04/05/15   Boykin Nearing, MD  TRUEPLUS LANCETS 28G MISC 1 each by Does not apply route 3 (three) times daily. 01/07/15   Josalyn Funches, MD   BP 195/126 mmHg  Pulse 119  Temp(Src) 97.2 F (36.2 C) (Oral)  Resp 20  Ht 5' 4"  (1.626 m)  Wt 180 lb (81.647 kg)  BMI 30.88 kg/m2  SpO2 100%  LMP 04/19/2015 Physical Exam  Constitutional: She is oriented to person, place, and time. She appears well-developed and well-nourished.  HENT:  Head: Normocephalic and atraumatic.  Eyes: Pupils are equal, round, and reactive to light.  Neck: Normal range of motion. Neck supple.  Cardiovascular: Normal rate, regular rhythm and normal heart  sounds.   Pulmonary/Chest: Effort normal and breath sounds normal. No respiratory distress. She has no wheezes. She has no rales. She exhibits no tenderness.  Abdominal: Soft. Bowel sounds are normal. There is no tenderness. There is no rebound and no guarding.  Musculoskeletal: Normal range of motion. She exhibits no edema.  Lymphadenopathy:    She has no cervical adenopathy.  Neurological: She is alert and oriented to person, place, and time.  Skin: Skin is warm and dry. No rash noted.  Psychiatric: She has a normal mood and affect.    ED Course  Procedures (including critical care time) Labs Review Results for orders placed or performed during the hospital encounter of 04/27/15  Lipase, blood  Result Value Ref Range   Lipase 12 11 - 51 U/L  Comprehensive metabolic panel  Result Value Ref Range   Sodium 140 135 - 145 mmol/L   Potassium 4.6 3.5 - 5.1 mmol/L   Chloride 112 (H) 101 - 111 mmol/L   CO2 16 (  L) 22 - 32 mmol/L   Glucose, Bld 80 65 - 99 mg/dL   BUN 42 (H) 6 - 20 mg/dL   Creatinine, Ser 5.00 (H) 0.44 - 1.00 mg/dL   Calcium 9.0 8.9 - 10.3 mg/dL   Total Protein 7.3 6.5 - 8.1 g/dL   Albumin 4.0 3.5 - 5.0 g/dL   AST 14 (L) 15 - 41 U/L   ALT 11 (L) 14 - 54 U/L   Alkaline Phosphatase 99 38 - 126 U/L   Total Bilirubin 0.6 0.3 - 1.2 mg/dL   GFR calc non Af Amer 10 (L) >60 mL/min   GFR calc Af Amer 11 (L) >60 mL/min   Anion gap 12 5 - 15  CBC  Result Value Ref Range   WBC 5.5 4.0 - 10.5 K/uL   RBC 3.17 (L) 3.87 - 5.11 MIL/uL   Hemoglobin 8.6 (L) 12.0 - 15.0 g/dL   HCT 26.4 (L) 36.0 - 46.0 %   MCV 83.3 78.0 - 100.0 fL   MCH 27.1 26.0 - 34.0 pg   MCHC 32.6 30.0 - 36.0 g/dL   RDW 14.5 11.5 - 15.5 %   Platelets 221 150 - 400 K/uL  Urinalysis, Routine w reflex microscopic (not at [facility])  Result Value Ref Range   Color, Urine YELLOW YELLOW   APPearance CLOUDY (A) CLEAR   Specific Gravity, Urine 1.015 1.005 - 1.030   pH 5.0 5.0 - 8.0   Glucose, UA NEGATIVE NEGATIVE mg/dL    Hgb urine dipstick NEGATIVE NEGATIVE   Bilirubin Urine NEGATIVE NEGATIVE   Ketones, ur NEGATIVE NEGATIVE mg/dL   Protein, ur >300 (A) NEGATIVE mg/dL   Nitrite NEGATIVE NEGATIVE   Leukocytes, UA NEGATIVE NEGATIVE  Urine microscopic-add on  Result Value Ref Range   Squamous Epithelial / LPF 6-30 (A) NONE SEEN   WBC, UA 0-5 0 - 5 WBC/hpf   RBC / HPF NONE SEEN 0 - 5 RBC/hpf   Bacteria, UA RARE (A) NONE SEEN  CBG monitoring, ED  Result Value Ref Range   Glucose-Capillary 31 (LL) 65 - 99 mg/dL   Comment 1 Notify RN   I-Stat beta hCG blood, ED ([facility], [facility], [facility] only)  Result Value Ref Range   I-stat hCG, quantitative <5.0 <5 mIU/mL   Comment 3          CBG monitoring, ED  Result Value Ref Range   Glucose-Capillary 61 (L) 65 - 99 mg/dL   Comment 1 Notify RN    Comment 2 Document in Chart   CBG monitoring, ED  Result Value Ref Range   Glucose-Capillary 159 (H) 65 - 99 mg/dL   Comment 1 Notify RN    Comment 2 Document in Chart    Dg Abd Acute W/chest  04/27/2015  CLINICAL DATA:  Abdominal pain for 4 weeks. EXAM: DG ABDOMEN ACUTE W/ 1V CHEST COMPARISON:  02/19/2015. FINDINGS: Frontal view of the chest shows midline trachea. Heart is enlarged. Lungs are clear. No pleural fluid. Two views of the abdomen show a large amount of stool in the colon with associated gaseous distension in the left upper quadrant. No small bowel dilatation. No unexpected radiopaque calculi. IMPRESSION: Severe constipation. Electronically Signed   By: Lorin Picket M.D.   On: 04/27/2015 15:45      Imaging Review Dg Abd Acute W/chest  04/27/2015  CLINICAL DATA:  Abdominal pain for 4 weeks. EXAM: DG ABDOMEN ACUTE W/ 1V CHEST COMPARISON:  02/19/2015. FINDINGS: Frontal view of the chest shows midline  trachea. Heart is enlarged. Lungs are clear. No pleural fluid. Two views of the abdomen show a large amount of stool in the colon with associated gaseous distension in the left upper quadrant. No small bowel dilatation. No  unexpected radiopaque calculi. IMPRESSION: Severe constipation. Electronically Signed   By: Lorin Picket M.D.   On: 04/27/2015 15:45   I have personally reviewed and evaluated these images and lab results as part of my medical decision-making.   EKG Interpretation None      MDM   Final diagnoses:  Constipation, unspecified constipation type  Non-intractable vomiting with nausea, vomiting of unspecified type    PT presents with constipation.  She has had multiple visits for same in conjunction with vomiting.  On arrival, she had no abd pain or vomiting.  There was a long delay to her getting her first enema.  Following this, she had a lot of abd cramping and vomiting.  Was given zofran.  No current vomiting, but still with a lot of cramping.  Given 2nd enema.  Results pending.  Has CKD with creatinine at baseline.  Hgb at baseline.  Care turned over to Dr. Kathrynn Humble.  Can likely go home if symptoms improve.    Malvin Johns, MD 04/27/15 2337

## 2015-04-27 NOTE — ED Notes (Signed)
Pt reminded of need for UA. Pt states she will try to void in a few mins.

## 2015-04-27 NOTE — ED Notes (Signed)
MD made aware that pt is vomiting and in pain

## 2015-04-27 NOTE — ED Notes (Signed)
Pt states she has not made a bowl movement since March 18th. C/o abdominal pain, bloating and nausea. Hx constipation

## 2015-04-27 NOTE — ED Notes (Signed)
Gave patient 3 OJs, CBG up to 61. Will give more and repeat shortly

## 2015-04-27 NOTE — ED Notes (Signed)
Attempted IV once, unsuccessful. Pt is a difficult stick, will re try when IV medication is ordered

## 2015-04-27 NOTE — ED Notes (Signed)
Asked pt to provide urine specimen and she states she just went to restroom and is unable to provide sample at this time

## 2015-04-27 NOTE — Progress Notes (Addendum)
Patient noted to have had 5 ED visits within the last six months.  This patient [facility]  [facility]

## 2015-04-27 NOTE — ED Notes (Signed)
Pt states it's been 40 days since she has had a bowel movement.

## 2015-04-28 ENCOUNTER — Encounter (HOSPITAL_COMMUNITY): Payer: Self-pay | Admitting: Family Medicine

## 2015-04-28 DIAGNOSIS — R112 Nausea with vomiting, unspecified: Secondary | ICD-10-CM

## 2015-04-28 DIAGNOSIS — Z794 Long term (current) use of insulin: Secondary | ICD-10-CM

## 2015-04-28 DIAGNOSIS — D631 Anemia in chronic kidney disease: Secondary | ICD-10-CM

## 2015-04-28 DIAGNOSIS — R1110 Vomiting, unspecified: Secondary | ICD-10-CM | POA: Insufficient documentation

## 2015-04-28 DIAGNOSIS — K5900 Constipation, unspecified: Secondary | ICD-10-CM

## 2015-04-28 DIAGNOSIS — I150 Renovascular hypertension: Secondary | ICD-10-CM

## 2015-04-28 DIAGNOSIS — N185 Chronic kidney disease, stage 5: Secondary | ICD-10-CM

## 2015-04-28 DIAGNOSIS — E1122 Type 2 diabetes mellitus with diabetic chronic kidney disease: Secondary | ICD-10-CM

## 2015-04-28 DIAGNOSIS — K567 Ileus, unspecified: Secondary | ICD-10-CM | POA: Diagnosis present

## 2015-04-28 LAB — GLUCOSE, CAPILLARY
GLUCOSE-CAPILLARY: 363 mg/dL — AB (ref 65–99)
GLUCOSE-CAPILLARY: 151 mg/dL — AB (ref 65–99)
GLUCOSE-CAPILLARY: 64 mg/dL — AB (ref 65–99)
GLUCOSE-CAPILLARY: 120 mg/dL — AB (ref 65–99)
GLUCOSE-CAPILLARY: 148 mg/dL — AB (ref 65–99)
Glucose-Capillary: 201 mg/dL — ABNORMAL HIGH (ref 65–99)

## 2015-04-28 LAB — CREATININE, SERUM
CREATININE: 5.41 mg/dL — AB (ref 0.44–1.00)
GFR calc Af Amer: 10 mL/min — ABNORMAL LOW (ref >60)
GFR calc non Af Amer: 9 mL/min — ABNORMAL LOW (ref >60)

## 2015-04-28 LAB — CBC
HCT: 25.1 % — ABNORMAL LOW (ref 36.0–46.0)
HEMOGLOBIN: 8 g/dL — AB (ref 12.0–15.0)
MCH: 26 pg (ref 26.0–34.0)
MCHC: 31.9 g/dL (ref 30.0–36.0)
MCV: 81.5 fL (ref 78.0–100.0)
PLATELETS: 201 10*3/uL (ref 150–400)
RBC: 3.08 MIL/uL — AB (ref 3.87–5.11)
RDW: 14.7 % (ref 11.5–15.5)
WBC: 9.9 10*3/uL (ref 4.0–10.5)

## 2015-04-28 MED ORDER — POLYETHYLENE GLYCOL 3350 17 G PO PACK: 17.0000 g | PACK | Freq: Every day | ORAL | Status: DC

## 2015-04-28 MED ORDER — HEPARIN SODIUM (PORCINE) 5000 UNIT/ML IJ SOLN
5000.0000 [IU] | Freq: Three times a day (TID) | INTRAMUSCULAR | Status: DC
  Filled 2015-04-28 (×7): qty 1

## 2015-04-28 MED ORDER — ACETAMINOPHEN 650 MG RE SUPP
650.0000 mg | Freq: Four times a day (QID) | RECTAL | Status: DC | PRN
Start: 2015-04-28 — End: 2015-04-29

## 2015-04-28 MED ORDER — SENNA 8.6 MG PO TABS: 2.0000 | ORAL_TABLET | Freq: Every day | ORAL | Status: DC

## 2015-04-28 MED ORDER — CLONIDINE HCL 0.3 MG PO TABS
0.3000 mg | ORAL_TABLET | Freq: Two times a day (BID) | ORAL | Status: DC
  Administered 2015-04-28 – 2015-04-29 (×3): 0.3 mg via ORAL
  Filled 2015-04-28 (×4): qty 1

## 2015-04-28 MED ORDER — DILTIAZEM HCL ER COATED BEADS 360 MG PO CP24
360.0000 mg | ORAL_CAPSULE | Freq: Every day | ORAL | Status: DC
  Administered 2015-04-28 – 2015-04-29 (×2): 360 mg via ORAL
  Filled 2015-04-28 (×2): qty 1

## 2015-04-28 MED ORDER — PROMETHAZINE HCL 25 MG PO TABS: 12.5000 mg | ORAL_TABLET | Freq: Four times a day (QID) | ORAL | Status: DC | PRN

## 2015-04-28 MED ORDER — METOCLOPRAMIDE HCL 5 MG/ML IJ SOLN
5.0000 mg | Freq: Once | INTRAMUSCULAR | Status: AC
  Administered 2015-04-28: 5 mg via INTRAVENOUS
  Filled 2015-04-28: qty 2

## 2015-04-28 MED ORDER — INSULIN ASPART 100 UNIT/ML ~~LOC~~ SOLN
0.0000 [IU] | Freq: Three times a day (TID) | SUBCUTANEOUS | Status: DC
  Administered 2015-04-28: 1 [IU] via SUBCUTANEOUS
  Administered 2015-04-28: 2 [IU] via SUBCUTANEOUS
  Administered 2015-04-28: 9 [IU] via SUBCUTANEOUS
  Administered 2015-04-29: 2 [IU] via SUBCUTANEOUS
  Administered 2015-04-29: 3 [IU] via SUBCUTANEOUS

## 2015-04-28 MED ORDER — SODIUM CHLORIDE 0.9 % IV SOLN
INTRAVENOUS | Status: AC
  Administered 2015-04-28: 08:00:00 via INTRAVENOUS
  Filled 2015-04-28: qty 1000

## 2015-04-28 MED ORDER — HYDRALAZINE HCL 50 MG PO TABS
100.0000 mg | ORAL_TABLET | Freq: Three times a day (TID) | ORAL | Status: DC
  Administered 2015-04-28 – 2015-04-29 (×4): 100 mg via ORAL
  Filled 2015-04-28 (×6): qty 2

## 2015-04-28 MED ORDER — RISAQUAD PO CAPS
1.0000 | ORAL_CAPSULE | Freq: Every day | ORAL | Status: DC
  Administered 2015-04-28 – 2015-04-29 (×2): 1 via ORAL
  Filled 2015-04-28 (×2): qty 1

## 2015-04-28 MED ORDER — ONDANSETRON HCL 4 MG/2ML IJ SOLN: 4.0000 mg | Freq: Four times a day (QID) | INTRAMUSCULAR | Status: DC | PRN

## 2015-04-28 MED ORDER — ONDANSETRON HCL 4 MG PO TABS
4.0000 mg | ORAL_TABLET | Freq: Four times a day (QID) | ORAL | Status: DC | PRN
  Administered 2015-04-28 (×2): 4 mg via ORAL
  Filled 2015-04-28 (×2): qty 1

## 2015-04-28 MED ORDER — INSULIN ASPART 100 UNIT/ML ~~LOC~~ SOLN
0.0000 [IU] | Freq: Every day | SUBCUTANEOUS | Status: DC
  Administered 2015-04-28: 2 [IU] via SUBCUTANEOUS

## 2015-04-28 MED ORDER — ACETAMINOPHEN 325 MG PO TABS: 650.0000 mg | ORAL_TABLET | Freq: Four times a day (QID) | ORAL | Status: DC | PRN

## 2015-04-28 NOTE — Progress Notes (Signed)
Refused tap water enema.  Patient stated "im moving my bowls now" encouraged patient to have results witnessed

## 2015-04-28 NOTE — Progress Notes (Signed)
Results from SSE: She stated it went well, expelling large amt stool with ease. Said she would like to have another tonight. Info shared with Dr Tat. Lyndel Dancel, [employer]

## 2015-04-28 NOTE — H&P (Signed)
History and Physical  Patient Name: Crystal Tanner     SLH:[id number]    DOB: 1970-10-09    DOA: 04/27/2015 Referring provider: Varney Biles, MD PCP: Minerva Ends, MD  Outpatient specialists:  None Patient coming from: Home  Chief Complaint: Abdominal pain  HPI: Crystal Tanner is a 45 y.o. 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  The patient has chronic constipation and has now been 40 days without a bowel movement.  This is her fifth ER visit for constipation in the last month. Her last ER visit was 1 month ago, and her last PCP visit was 3 weeks ago. Since then Mag citrate has not worked, [employer] make her sick. In the last week now she has a "hard belly", abdominal distention, abdominal pain, diffuse and crampy, and the last 2 days she hasn't been able to eat or sleep because of pain and nausea and NBNB vomiting, so she came back to the ER.  In the ED, she was hemodynamically stable, hypertensive.  Na 140, K 4.6, Cr 5.0 (at baseline, not yet on HD), LFT normal, lipase normal, WBC 5.5K, Hgb 8.6 (normocytic and at baseline).  Flat radiograph of the abdomen again showed remarkable stool burden, similar to 2 months ago. She was not impacted on exam and so was given an SMO G enema with no result other than recurrent vomiting. She was subsequently given a soapsuds enema, which produced a first bowel movement about 2 hours later, but failed an oral challenge, and continued to have abdominal discomfort, chills, and malaise, and so [facility] were asked to evaluate for observation admission.  She was somewhat hypoglycemic, improved with D5 infusion.  Has CKD stage V, not on HD.  No nephrologist in [place], working on getting her [facility] card.  Recent referral to GI, has not happened yet.       Review of Systems:  Pt denies any hematemesis, hematochezia, fevers, confusion.  All other  systems negative except as just noted or noted in the history of present illness.    Past Medical History  Diagnosis Date  . Hypertension   . Diabetes mellitus without complication ([place]) 1572  . Gastritis   . Chronic kidney disease   . Acute kidney injury ([place])   . Anemia   . Renal cyst, left   . Constipation     Past Surgical History  Procedure Laterality Date  . Abscess drainage      Laparoscopic, abdominal, unsure of details    Social History: Patient lives with her father.  She is former smoker, does not drink.  Moved here from [place] within the last year.  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   Walks without cane or walker, independent with all IADLs.       No Known Allergies  Family history: family history includes Diabetes in her mother; Hypertension in her mother.   No family history of GI disease.  Prior to Admission medications   Medication Sig Start Date End Date Taking? Authorizing Provider  acetaminophen (TYLENOL) 500 MG tablet Take 1,000 mg by mouth every 6 (six) hours as needed for moderate pain or headache. Reported on 04/05/2015   Yes Historical Provider, MD  cloNIDine (CATAPRES) 0.3 MG tablet Take 1 tablet (0.3 mg total) by mouth 2 (two) times daily. 04/26/15  Yes Josalyn Funches, MD  diltiazem (CARDIZEM CD) 180  MG 24 hr capsule Take 2 capsules (360 mg total) by mouth daily. 04/26/15  Yes Josalyn Funches, MD  furosemide (LASIX) 40 MG tablet Take 1 tablet (40 mg total) by mouth daily. 04/26/15  Yes Josalyn Funches, MD  hydrALAZINE (APRESOLINE) 100 MG tablet Take 1 tablet (100 mg total) by mouth 3 (three) times daily. 04/26/15  Yes Josalyn Funches, MD  insulin glargine (LANTUS) 100 UNIT/ML injection Inject 0.08 mLs (8 Units total) into the skin at bedtime. 04/27/15  Yes Josalyn Funches, MD  polyethylene glycol (MIRALAX / GLYCOLAX) packet Take 17 g by mouth daily as needed. Patient taking differently: Take 17 g by mouth daily as needed for mild  constipation.  01/07/15  Yes Boykin Nearing, MD  Probiotic Product (PROBIOTIC PO) Take 1 capsule by mouth daily.   Yes Historical Provider, MD  Blood Glucose Monitoring Suppl (TRUE METRIX METER) w/Device KIT 1 each by Does not apply route 3 (three) times daily. 01/07/15   Josalyn Funches, MD  glucose blood (FREESTYLE TEST STRIPS) test strip Use as instructed 01/07/15   Adriana Mccallum Funches, MD  glucose blood (TRUE METRIX BLOOD GLUCOSE TEST) test strip Use as instructed 01/07/15   Boykin Nearing, MD  TRUEPLUS LANCETS 28G MISC 1 each by Does not apply route 3 (three) times daily. 01/07/15   Boykin Nearing, MD       Physical Exam: BP 171/95 mmHg  Pulse 74  Temp(Src) 98.6 F (37 C) (Oral)  Resp 16  Ht _0  (1.626 m)  Wt 81.647 kg (180 lb)  BMI 30.88 kg/m2  SpO2 99%  LMP 04/19/2015 General appearance: Well-developed, adult female, alert and in moderate distress from chills, nausea, pain.   Eyes: Anicteric, conjunctiva pink, lids and lashes normal.     ENT: No nasal deformity, discharge, or epistaxis.  OP dry without lesions.   Lymph: No cervical or supraclavicular lymphadenopathy. Skin: Warm and dry.  No suspicious rashes or lesions. Cardiac: RRR, nl S1-S2, S4 appreciated.  Capillary refill is brisk.  JVP normal.  No pitting LE edema.  Radial pulses 2+ and symmetric. Respiratory: Normal respiratory rate and rhythm.  CTAB without rales or wheezes. Abdomen: Abdomen soft without rigidity or rebound.  Diffuse TTP, moderate. No ascites, mild distension.   MSK: No deformities or effusions. Neuro: Sensorium intact and responding to questions, attention normal.  Speech is fluent.  Moves all extremities equally and with normal coordination.    Psych: Behavior appropriate.  Affect anxious from vomiting.  No evidence of aural or visual hallucinations or delusions.       Labs on Admission:  I have personally reviewed following labs and imaging studies: CBC:  Recent Labs Lab 04/27/15 1454  WBC 5.5  HGB  8.6*  HCT 26.4*  MCV 83.3  PLT 024   Basic Metabolic Panel:  Recent Labs Lab 04/27/15 1454  NA 140  K 4.6  CL 112*  CO2 16*  GLUCOSE 80  BUN 42*  CREATININE 5.00*  CALCIUM 9.0   GFR: Estimated Creatinine Clearance: 14.8 mL/min (by C-G formula based on Cr of 5). Liver Function Tests:  Recent Labs Lab 04/27/15 1454  AST 14*  ALT 11*  ALKPHOS 99  BILITOT 0.6  PROT 7.3  ALBUMIN 4.0    Recent Labs Lab 04/27/15 1454  LIPASE 12   No results for input(s): AMMONIA in the last 168 hours. Coagulation Profile: No results for input(s): INR, PROTIME in the last 168 hours. Cardiac Enzymes: No results for input(s): CKTOTAL, CKMB, CKMBINDEX, TROPONINI in  the last 168 hours. BNP (last 3 results) No results for input(s): PROBNP in the last 8760 hours. HbA1C: No results for input(s): HGBA1C in the last 72 hours. CBG:  Recent Labs Lab 04/27/15 1408 04/27/15 1427 04/27/15 1725  GLUCAP 31* 61* 159*   Lipid Profile: No results for input(s): CHOL, HDL, LDLCALC, TRIG, CHOLHDL, LDLDIRECT in the last 72 hours. Thyroid Function Tests: No results for input(s): TSH, T4TOTAL, FREET4, T3FREE, THYROIDAB in the last 72 hours. Anemia Panel: No results for input(s): VITAMINB12, FOLATE, FERRITIN, TIBC, IRON, RETICCTPCT in the last 72 hours. Urine analysis:    Component Value Date/Time   COLORURINE YELLOW 04/27/2015 1735   APPEARANCEUR CLOUDY* 04/27/2015 1735   LABSPEC 1.015 04/27/2015 1735   PHURINE 5.0 04/27/2015 1735   GLUCOSEU NEGATIVE 04/27/2015 1735   HGBUR NEGATIVE 04/27/2015 1735   BILIRUBINUR NEGATIVE 04/27/2015 1735   KETONESUR NEGATIVE 04/27/2015 1735   PROTEINUR >300* 04/27/2015 1735   UROBILINOGEN 0.2 09/08/2014 1400   NITRITE NEGATIVE 04/27/2015 1735   LEUKOCYTESUR NEGATIVE 04/27/2015 1735   Sepsis Labs: _0 (procalcitonin:4,lacticidven:4) )No results found for this or any previous visit (from the past 240 hour(s)).       Radiological Exams on  Admission: Personally reviewed: Dg Abd Acute W/chest  04/27/2015  CLINICAL DATA:  Abdominal pain for 4 weeks. EXAM: DG ABDOMEN ACUTE W/ 1V CHEST COMPARISON:  02/19/2015. FINDINGS: Frontal view of the chest shows midline trachea. Heart is enlarged. Lungs are clear. No pleural fluid. Two views of the abdomen show a large amount of stool in the colon with associated gaseous distension in the left upper quadrant. No small bowel dilatation. No unexpected radiopaque calculi. IMPRESSION: Severe constipation. Electronically Signed   By: Lorin Picket M.D.   On: 04/27/2015 15:45        Assessment/Plan 1. Constipation causing ileus and vomiting:  The patient has chronic constipation, has no preventive regimen, and only takes probiotic and Mylanta for acute treatment of constipation.  This is clearly inadequate in the given circumstances.  She reports that Senna and docusate and MiraLAX do not help her acutely.  No impaction in ER. -Has gotten suppository already -Tap water enemas every 2-3 hours this morning until clear -Preventive regimen on discharge -Has GI referral pending   2. HTN:  -Continue home clonidine, diltiazem, hydralazine -Hold home furosemide for now  3. CKD stage V:  At baseline.  Does not have local Nephrologist.  Working on getting an orange card.   4. IDDM:  Hypoglycemic at admission.   -Will defer D5 unless hypoglycemic again, now that should be able to advance diet. -Hold home glargine for now -Low dose sliding scale corrections as needed  5. Anemia of renal disease: Stable. Defer iron supplement until constipation controlled.       DVT prophylaxis: Heparin  Code Status: FULL  Family Communication: None present  Disposition Plan: Anticipate further enemas this morning until clear. Then start daily oral regimen and likely home tomorrow. Consults called: None Medical decision making: Patient seen at 2:20 AM on 04/28/2015.  The patient was discussed with Dr.  Kathrynn Humble. I recommend admission to med surg, observation status.  Clinical condition: stable.      Edwin Dada [facility] Pager [phone]

## 2015-04-28 NOTE — ED Provider Notes (Addendum)
  Physical Exam  BP 188/74 mmHg  Pulse 116  Temp(Src) 97.2 F (36.2 C) (Oral)  Resp 20  Ht 5\' 4"  (1.626 m)  Wt 180 lb (81.647 kg)  BMI 30.88 kg/m2  SpO2 98%  LMP 04/19/2015  Physical Exam  ED Course  Procedures  MDM  Pt comes in with cc of constipation. Pt received enema x 2. Anticipate discharge from constipation side.  Pt also had some nausea. Likely due to constipation or gastroparesis, Zofran given.  Will start oral challenge and reassess. Pt has CKD, baseline elevated Cr. No signs of severe dehydration.     Derwood KaplanAnkit Kentaro Alewine, MD 04/28/15 [id number]  Derwood KaplanAnkit Chesney Klimaszewski, MD 04/28/15 0025

## 2015-04-28 NOTE — Telephone Encounter (Signed)
Pt notified Rx send to pharmacy  Pt stated in the hospital do to constipation  Pt Will schedule HFU  appointment

## 2015-04-28 NOTE — Progress Notes (Signed)
PROGRESS NOTE  Crystal Tanner ZOX:[id number] DOB: 05-01-1970 DOA: 04/27/2015 PCP: Lora Paula, MD Outpatient Specialists:  None  Brief History:  45 y/o female with history of chronic constipation, CKD 5, IDDM, HTN, anemia of CKD, tobacco abuse presented with abdominal pain and nausea and vomiting. The patient stated that she had not had a bowel movement for 40 days. The patient saw her primary care provider on 04/05/2015 at which time the patient was prescribed mag citrate and SMOG enema. She states that Max citrate didn't work, and smog enemas make her "sick". In the ED, serum creatinine was 5.0 with normal lipase. WBC was 5.5 with hemoglobin 8.6. The patient was given a soapsuds enema which produced a bowel movement 2 hours later. Unfortunately she failed a po challenge. As result, the patient was admitted for observation.  Assessment/Plan: Obstipation -Patient claims she has had 2 additional bowel movements since arrival to the medical floor, but did not notify RN -pt refused soap suds enema claiming she moved her bowels, but has requested another enema this am -order another soap suds enema -advance diet -04/27/2015 AAS--large amount of stool, gaseous distention without small bowel dilatation  HTN, uncontrolled -Continue clonidine, Cardizem CD, hydralazine  Diabetes mellitus type 2 -Patient was hypoglycemic in the emergency department -hold glargine -NovoLog sliding scale  Acute on chronic renal failureCKD stage V -likely volume depletion in setting of N/V -give fluid challenge  Anemia of CKD -Baseline hemoglobin approximately 8     Disposition Plan:   Home 4/28 if stable and renal function improves Family Communication:   No Family at beside  Consultants: none  Code Status: FULL    Subjective: Patient states that she has 2 bowel movements since arrival to medical floor, but did not notify nursing staff. Therefore this cannot be objectively verified.  The patient states abdominal pain overall is improving. No further nausea or vomiting. She feels hungry this morning. Denies any headache, chest pain, shortness breath, vomiting, diarrhea. No dysuria or hematuria.  Objective: Filed Vitals:   04/28/15 0001 04/28/15 0155 04/28/15 0354 04/28/15 0400  BP: 188/74 171/95  177/66  Pulse: 116 74  65  Temp:  98.6 F (37 C)  98.4 F (36.9 C)  TempSrc:  Oral  Oral  Resp: Height:      Weight:   81.194 kg (179 lb)   SpO2: 98% 99%  100%    Intake/Output Summary (Last 24 hours) at 04/28/15 0734 Last data filed at 04/28/15 0355  Gross per 24 hour  Intake      0 ml  Output     75 ml  Net    -75 ml   Weight change:  Exam:   General:  Pt is alert, follows commands appropriately, not in acute distress  HEENT: No icterus, No thrush, No neck mass, [place]/AT  Cardiovascular: RRR, S1/S2, no rubs, no gallops  Respiratory: CTA bilaterally, no wheezing, no crackles, no rhonchi  Abdomen: Soft/+BS, non tender, non distended, no guarding  Extremities: 1+ LE edema, No lymphangitis, No petechiae, No rashes, no synovitis   Data Reviewed: I have personally reviewed following labs and imaging studies Basic Metabolic Panel:  Recent Labs Lab 04/27/15 1454 04/28/15 0443  NA 140  --   K 4.6  --   CL 112*  --   CO2 16*  --   GLUCOSE 80  --   BUN 42*  --   CREATININE  5.00* 5.41*  CALCIUM 9.0  --    Liver Function Tests:  Recent Labs Lab 04/27/15 1454  AST 14*  ALT 11*  ALKPHOS 99  BILITOT 0.6  PROT 7.3  ALBUMIN 4.0    Recent Labs Lab 04/27/15 1454  LIPASE 12   No results for input(s): AMMONIA in the last 168 hours. Coagulation Profile: No results for input(s): INR, PROTIME in the last 168 hours. CBC:  Recent Labs Lab 04/27/15 1454 04/28/15 0443  WBC 5.5 9.9  HGB 8.6* 8.0*  HCT 26.4* 25.1*  MCV 83.3 81.5  PLT 221 201   Cardiac Enzymes: No results for input(s): CKTOTAL, CKMB, CKMBINDEX, TROPONINI in the last  168 hours. BNP: Invalid input(s): POCBNP CBG:  Recent Labs Lab 04/27/15 1408 04/27/15 1427 04/27/15 1725  GLUCAP 31* 61* 159*   HbA1C: No results for input(s): HGBA1C in the last 72 hours. Urine analysis:    Component Value Date/Time   COLORURINE YELLOW 04/27/2015 1735   APPEARANCEUR CLOUDY* 04/27/2015 1735   LABSPEC 1.015 04/27/2015 1735   PHURINE 5.0 04/27/2015 1735   GLUCOSEU NEGATIVE 04/27/2015 1735   HGBUR NEGATIVE 04/27/2015 1735   BILIRUBINUR NEGATIVE 04/27/2015 1735   KETONESUR NEGATIVE 04/27/2015 1735   PROTEINUR >300* 04/27/2015 1735   UROBILINOGEN 0.2 09/08/2014 1400   NITRITE NEGATIVE 04/27/2015 1735   LEUKOCYTESUR NEGATIVE 04/27/2015 1735   Sepsis Labs: @LABRCNTIP (procalcitonin:4,lacticidven:4) )No results found for this or any previous visit (from the past 240 hour(s)).   Scheduled Meds: . acidophilus  1 capsule Oral Daily  . cloNIDine  0.3 mg Oral BID  . diltiazem  360 mg Oral Daily  . heparin  5,000 Units Subcutaneous Q8H  . hydrALAZINE  100 mg Oral TID  . insulin aspart  0-5 Units Subcutaneous QHS  . insulin aspart  0-9 Units Subcutaneous TID WC   Continuous Infusions: . sodium chloride 0.9 % 1,000 mL infusion      Procedures/Studies: Dg Abd Acute W/chest  04/27/2015  CLINICAL DATA:  Abdominal pain for 4 weeks. EXAM: DG ABDOMEN ACUTE W/ 1V CHEST COMPARISON:  02/19/2015. FINDINGS: Frontal view of the chest shows midline trachea. Heart is enlarged. Lungs are clear. No pleural fluid. Two views of the abdomen show a large amount of stool in the colon with associated gaseous distension in the left upper quadrant. No small bowel dilatation. No unexpected radiopaque calculi. IMPRESSION: Severe constipation. Electronically Signed   By: Leanna BattlesMelinda  Blietz M.D.   On: 04/27/2015 15:45    Alisan Dokes, DO  [facility] Pager [phone]  If 7PM-7AM, please contact night-coverage [email or web address] Password [email or web address] 04/28/2015, 7:34 AM

## 2015-04-29 DIAGNOSIS — K5909 Other constipation: Secondary | ICD-10-CM

## 2015-04-29 LAB — BASIC METABOLIC PANEL
Anion gap: 10 (ref 5–15)
BUN: 46 mg/dL — ABNORMAL HIGH (ref 6–20)
CO2: 18 mmol/L — ABNORMAL LOW (ref 22–32)
Calcium: 8.5 mg/dL — ABNORMAL LOW (ref 8.9–10.3)
Chloride: 113 mmol/L — ABNORMAL HIGH (ref 101–111)
Creatinine, Ser: 5.41 mg/dL — ABNORMAL HIGH (ref 0.44–1.00)
GFR calc non Af Amer: 9 mL/min — ABNORMAL LOW (ref >60)
GFR, EST AFRICAN AMERICAN: 10 mL/min — AB (ref >60)
GLUCOSE: 34 mg/dL — AB (ref 65–99)
Potassium: 4.5 mmol/L (ref 3.5–5.1)
SODIUM: 141 mmol/L (ref 135–145)

## 2015-04-29 LAB — GLUCOSE, CAPILLARY
GLUCOSE-CAPILLARY: 80 mg/dL (ref 65–99)
Glucose-Capillary: 26 mg/dL — CL (ref 65–99)
Glucose-Capillary: 193 mg/dL — ABNORMAL HIGH (ref 65–99)
Glucose-Capillary: 242 mg/dL — ABNORMAL HIGH (ref 65–99)

## 2015-04-29 MED ORDER — DEXTROSE 50 % IV SOLN
INTRAVENOUS | Status: AC
  Administered 2015-04-29: 25 mL
  Filled 2015-04-29: qty 50

## 2015-04-29 MED ORDER — ONDANSETRON HCL 4 MG PO TABS: 4.0000 mg | ORAL_TABLET | Freq: Three times a day (TID) | ORAL | Status: DC | PRN

## 2015-04-29 MED ORDER — INSULIN GLARGINE 100 UNIT/ML ~~LOC~~ SOLN: 4.0000 [IU] | Freq: Every day | SUBCUTANEOUS | Status: DC

## 2015-04-29 NOTE — Progress Notes (Signed)
Administered soap suds enema per order, no result.  Per pt request, administered another one per order I obtained per provider on call, pt had moderate amount of liquid stool.  Will continue to monitor.

## 2015-04-29 NOTE — Discharge Summary (Signed)
Physician Discharge Summary  Crystal Tanner ATF:[id number] DOB: [birth year] DOA: 04/27/2015  PCP: Minerva Ends, MD  Admit date: 04/27/2015 Discharge date: 04/29/2015  Recommendations for Outpatient Follow-up:  1. Pt will need to follow up with PCP in 2 weeks post discharge 2. BMP in one week   Discharge Diagnoses:  Obstipation -Initially, pt refused soap suds enema, but later agreed -soap suds enemas on medical floor with good results -advance diet which the patient tolerated -04/27/2015 AAS--large amount of stool, gaseous distention without small bowel dilatation -Patient will discharge on a regular daily bowel regimen which has been discussed with the patient on previous occasions; the patient does not regularly take any stool softeners or laxative agents; she waits until her constipation is out of control--patient educated  -Again, the patient seems to be very resistant to taking any type of daily bowel regimen at home--she states that "why would I waste my money if those medicines don't work for me" even after explained to the patient that intermittent usage and waiting until she gets to the point of severe constipation limits their utility  HTN, uncontrolled -Continue clonidine, Cardizem CD, hydralazine  Diabetes mellitus type 2 -Patient was hypoglycemic in the emergency department -held glargine initially due to poor po intake and hypoglycemia--> restart after discharge -NovoLog sliding scale -04/05/2015 hemoglobin A1c 5.9--Although there was concern that this may not be accurate due to her low hemoglobin, her hemoglobin has remained stable (Hgb~8)  -previous hemoglobin A1c of 6.9 on 12/18/2014-->Hgb~8 -she has had a number of hypoglycemic episodes even in the outpt setting -I discussed the possibility of switching to po DM agents such as glipizide or sitagliptin;  Pt expressed hesitation with this and would prefer to follow up with her PCP -in the interim, I have recommended  she reduce her lantus to 4 units daily  Gastroparesis - recommend small frequent meals   Acute on chronic renal failure CKD stage V -likely volume depletion in setting of N/V -give fluid challenge--> serum creatinine stabilized at 5.41 -held furosemide  Anemia of CKD -Baseline hemoglobin approximately 8  Discharge Condition: stable  Disposition: home  Diet:carb modified Wt Readings from Last 3 Encounters:  04/28/15 81.194 kg (179 lb)  04/05/15 91.173 kg (201 lb)  02/19/15 81.647 kg (180 lb)    History of present illness:  45 y/o female with history of chronic constipation, CKD 5, IDDM, HTN, anemia of CKD, tobacco abuse presented with abdominal pain and nausea and vomiting. The patient stated that she had not had a bowel movement for 40 days. The patient saw her primary care provider on 04/05/2015 at which time the patient was prescribed mag citrate and SMOG enema. She states that Max citrate didn't work, and smog enemas make her "sick". In the ED, serum creatinine was 5.0 with normal lipase. WBC was 5.5 with hemoglobin 8.6. The patient was given a soapsuds enema which produced a bowel movement 2 hours later. Unfortunately she failed a po challenge. As result, the patient was admitted for observation.The patient initially refused additional soapsuds enema. Ultimately, she agreed to have a soapsuds enema on the afternoon of 04/28/2015 with good results. The patient's diet was advanced which she tolerated. The patient was given another soapsuds enema on evening of 04/28/2015 producing liquid stool.  The patient continued to pass flatus.  Abdominal exam was benign. Educated the patient about the importance of daily bowel regimen. She seems resistant to this concept.   Discharge Exam: Filed Vitals:   04/29/15 2706 04/29/15 2376  BP: 158/60 191/74  Pulse: 55 67  Temp: 98.1 F (36.7 C)   Resp: 16    Filed Vitals:   04/28/15 2125 04/29/15 0406 04/29/15 0531 04/29/15 0958  BP: 160/61  177/81 158/60 191/74  Pulse: 68 58 55 67  Temp: 99.5 F (37.5 C) 98.6 F (37 C) 98.1 F (36.7 C)   TempSrc: Oral Oral Oral   Resp: 18 18 16    Height:      Weight:      SpO2: 100% 100% 99% 98%   General: A&O x 3, NAD, pleasant, cooperative Cardiovascular: RRR, no rub, no gallop, no S3 Respiratory: CTAB, no wheeze, no rhonchi Abdomen:soft, nontender, nondistended, positive bowel sounds Extremities: No edema, No lymphangitis, no petechiae  Discharge Instructions      Discharge Instructions    Diet Carb Modified    Complete by:  As directed      Increase activity slowly    Complete by:  As directed             Medication List    TAKE these medications        acetaminophen 500 MG tablet  Commonly known as:  TYLENOL  Take 1,000 mg by mouth every 6 (six) hours as needed for moderate pain or headache. Reported on 04/05/2015     cloNIDine 0.3 MG tablet  Commonly known as:  CATAPRES  Take 1 tablet (0.3 mg total) by mouth 2 (two) times daily.     diltiazem 180 MG 24 hr capsule  Commonly known as:  CARDIZEM CD  Take 2 capsules (360 mg total) by mouth daily.     furosemide 40 MG tablet  Commonly known as:  LASIX  Take 1 tablet (40 mg total) by mouth daily.     glucose blood test strip  Commonly known as:  FREESTYLE TEST STRIPS  Use as instructed     glucose blood test strip  Commonly known as:  TRUE METRIX BLOOD GLUCOSE TEST  Use as instructed     hydrALAZINE 100 MG tablet  Commonly known as:  APRESOLINE  Take 1 tablet (100 mg total) by mouth 3 (three) times daily.     insulin glargine 100 UNIT/ML injection  Commonly known as:  LANTUS  Inject 0.04 mLs (4 Units total) into the skin at bedtime.     polyethylene glycol packet  Commonly known as:  MIRALAX / GLYCOLAX  Take 17 g by mouth daily.     PROBIOTIC PO  Take 1 capsule by mouth daily.     senna 8.6 MG Tabs tablet  Commonly known as:  SENOKOT  Take 2 tablets (17.2 mg total) by mouth daily.     TRUE  METRIX METER w/Device Kit  1 each by Does not apply route 3 (three) times daily.     TRUEPLUS LANCETS 28G Misc  1 each by Does not apply route 3 (three) times daily.         The results of significant diagnostics from this hospitalization (including imaging, microbiology, ancillary and laboratory) are listed below for reference.    Significant Diagnostic Studies: Dg Abd Acute W/chest  04/27/2015  CLINICAL DATA:  Abdominal pain for 4 weeks. EXAM: DG ABDOMEN ACUTE W/ 1V CHEST COMPARISON:  02/19/2015. FINDINGS: Frontal view of the chest shows midline trachea. Heart is enlarged. Lungs are clear. No pleural fluid. Two views of the abdomen show a large amount of stool in the colon with associated gaseous distension in the left upper quadrant. No small  bowel dilatation. No unexpected radiopaque calculi. IMPRESSION: Severe constipation. Electronically Signed   By: Lorin Picket M.D.   On: 04/27/2015 15:45     Microbiology: No results found for this or any previous visit (from the past 240 hour(s)).   Labs: Basic Metabolic Panel:  Recent Labs Lab 04/27/15 1454 04/28/15 0443 04/29/15 0347  NA 140  --  141  K 4.6  --  4.5  CL 112*  --  113*  CO2 16*  --  18*  GLUCOSE 80  --  34*  BUN 42*  --  46*  CREATININE 5.00* 5.41* 5.41*  CALCIUM 9.0  --  8.5*   Liver Function Tests:  Recent Labs Lab 04/27/15 1454  AST 14*  ALT 11*  ALKPHOS 99  BILITOT 0.6  PROT 7.3  ALBUMIN 4.0    Recent Labs Lab 04/27/15 1454  LIPASE 12   No results for input(s): AMMONIA in the last 168 hours. CBC:  Recent Labs Lab 04/27/15 1454 04/28/15 0443  WBC 5.5 9.9  HGB 8.6* 8.0*  HCT 26.4* 25.1*  MCV 83.3 81.5  PLT 221 201   Cardiac Enzymes: No results for input(s): CKTOTAL, CKMB, CKMBINDEX, TROPONINI in the last 168 hours. BNP: Invalid input(s): POCBNP CBG:  Recent Labs Lab 04/28/15 1720 04/28/15 2123 04/29/15 0358 04/29/15 0433 04/29/15 0742  GLUCAP 148* 201* 26* 80 193*     Time coordinating discharge:  Greater than 30 minutes  Signed:  Pete Schnitzer, DO [facility] Pager: [phone] 04/29/2015, 10:22 AM

## 2015-04-29 NOTE — Progress Notes (Signed)
Inpatient Diabetes Program Recommendations  AACE/ADA: New Consensus Statement on Inpatient Glycemic Control (2015)  Target Ranges:  Prepandial:   less than 140 mg/dL      Peak postprandial:   less than 180 mg/dL (1-2 hours)      Critically ill patients:  140 - 180 mg/dL   Review of Glycemic Control  Has not received basal insulin since admission. Hypoglycemia yesterday afternoon and early this am. HgbA1c - 5.9% - although H/H low thus may not be accurate.  Spoke with pt about her diabetes control. Pt states she has a lot of lows at home without symptoms.  Instructed her to check blood sugars more often and always carry glucose tabs or gel with her. Also recommended eating on a regular schedule and not going too long between meals. Suggested High fiber foods which will help with constipation and glycemic control.  [facility]  Will follow while inpatient. Thank you. Ailene Ardshonda Manford Sprong, RD, LDN, CDE Inpatient Diabetes Coordinator [phone]

## 2015-04-29 NOTE — Progress Notes (Signed)
Hypoglycemic Event  CBG: 28           Treatment: D50 IV 25 mL  Symptoms: Sweaty  Follow-up CBG: [id number] CBG Result 80  Possible Reasons for Event: Inadequate meal intake  Comments/MD notified :   Also gave pt 15 mg carbohydrates    Bethann GooWidener, Kristain Filo Diane

## 2015-05-18 MED FILL — !LANTUS SOLOSTAR 100UNITS/M: 100 | 28 days supply | Qty: 3 | Fill #1

## 2015-05-18 MED FILL — TRUE METRIX TEST STRIP: 12 days supply | Qty: 50 | Fill #2

## 2015-05-20 ENCOUNTER — Inpatient Hospital Stay: Payer: Self-pay | Admitting: Family Medicine

## 2015-05-23 MED FILL — cloNIDine HCL 0.3 MG TABS: 0.3 | 30 days supply | Qty: 60 | Fill #1

## 2015-05-23 MED FILL — hydrALAZINE HCL 100 MG TABS: 100 | 30 days supply | Qty: 90 | Fill #1

## 2015-05-23 MED FILL — DILTIAZEM 24HR ER 180 MG CA: 180 | 30 days supply | Qty: 60 | Fill #1

## 2015-05-23 MED FILL — ?FUROSEMIDE 40 MG TABLET: 40 | 30 days supply | Qty: 30 | Fill #0

## 2015-06-16 MED FILL — TRUE METRIX TEST STRIP: 24 days supply | Qty: 100 | Fill #3

## 2015-06-16 MED FILL — ?FUROSEMIDE 40 MG TABLET: 40 | 30 days supply | Qty: 30 | Fill #1

## 2015-06-16 MED FILL — !LANTUS SOLOSTAR 100UNITS/M: 100 | 28 days supply | Qty: 3 | Fill #2

## 2015-06-16 MED FILL — dilTIAZem CD 180 MG CP24: 180 | 30 days supply | Qty: 60 | Fill #2

## 2015-06-16 MED FILL — hydrALAZINE HCL 100 MG TABS: 100 | 30 days supply | Qty: 90 | Fill #2

## 2015-06-16 MED FILL — cloNIDine HCL 0.3 MG TABS: 0.3 | 30 days supply | Qty: 60 | Fill #2

## 2015-06-20 ENCOUNTER — Emergency Department (HOSPITAL_COMMUNITY): Payer: Medicaid Other

## 2015-06-20 ENCOUNTER — Encounter (HOSPITAL_COMMUNITY): Payer: Self-pay | Admitting: Emergency Medicine

## 2015-06-20 ENCOUNTER — Observation Stay (HOSPITAL_COMMUNITY)
Admission: EM | Admit: 2015-06-20 | Discharge: 2015-06-24 | Disposition: A | Discharge status: Home / Self Care | Payer: Medicaid Other | Attending: Internal Medicine | Admitting: Internal Medicine

## 2015-06-20 DIAGNOSIS — R112 Nausea with vomiting, unspecified: Secondary | ICD-10-CM | POA: Diagnosis present

## 2015-06-20 DIAGNOSIS — D631 Anemia in chronic kidney disease: Secondary | ICD-10-CM | POA: Diagnosis not present

## 2015-06-20 DIAGNOSIS — K5900 Constipation, unspecified: Secondary | ICD-10-CM | POA: Diagnosis present

## 2015-06-20 DIAGNOSIS — R809 Proteinuria, unspecified: Secondary | ICD-10-CM | POA: Diagnosis not present

## 2015-06-20 DIAGNOSIS — N189 Chronic kidney disease, unspecified: Secondary | ICD-10-CM

## 2015-06-20 DIAGNOSIS — Z794 Long term (current) use of insulin: Secondary | ICD-10-CM | POA: Diagnosis not present

## 2015-06-20 DIAGNOSIS — E1165 Type 2 diabetes mellitus with hyperglycemia: Secondary | ICD-10-CM | POA: Insufficient documentation

## 2015-06-20 DIAGNOSIS — N186 End stage renal disease: Secondary | ICD-10-CM | POA: Diagnosis not present

## 2015-06-20 DIAGNOSIS — K5909 Other constipation: Principal | ICD-10-CM | POA: Insufficient documentation

## 2015-06-20 DIAGNOSIS — Z87891 Personal history of nicotine dependence: Secondary | ICD-10-CM | POA: Diagnosis not present

## 2015-06-20 DIAGNOSIS — I120 Hypertensive chronic kidney disease with stage 5 chronic kidney disease or end stage renal disease: Secondary | ICD-10-CM | POA: Insufficient documentation

## 2015-06-20 DIAGNOSIS — Z79899 Other long term (current) drug therapy: Secondary | ICD-10-CM | POA: Insufficient documentation

## 2015-06-20 DIAGNOSIS — E1122 Type 2 diabetes mellitus with diabetic chronic kidney disease: Secondary | ICD-10-CM

## 2015-06-20 DIAGNOSIS — E872 Acidosis: Secondary | ICD-10-CM | POA: Diagnosis not present

## 2015-06-20 DIAGNOSIS — E119 Type 2 diabetes mellitus without complications: Secondary | ICD-10-CM

## 2015-06-20 DIAGNOSIS — N179 Acute kidney failure, unspecified: Secondary | ICD-10-CM | POA: Diagnosis present

## 2015-06-20 DIAGNOSIS — N185 Chronic kidney disease, stage 5: Secondary | ICD-10-CM

## 2015-06-20 DIAGNOSIS — N184 Chronic kidney disease, stage 4 (severe): Secondary | ICD-10-CM

## 2015-06-20 DIAGNOSIS — E876 Hypokalemia: Secondary | ICD-10-CM | POA: Diagnosis present

## 2015-06-20 LAB — CBC
HCT: 26.6 % — ABNORMAL LOW (ref 36.0–46.0)
Hemoglobin: 8.7 g/dL — ABNORMAL LOW (ref 12.0–15.0)
MCH: 26.4 pg (ref 26.0–34.0)
MCHC: 32.7 g/dL (ref 30.0–36.0)
MCV: 80.6 fL (ref 78.0–100.0)
PLATELETS: 215 10*3/uL (ref 150–400)
RBC: 3.3 MIL/uL — AB (ref 3.87–5.11)
RDW: 15.1 % (ref 11.5–15.5)
WBC: 6.1 10*3/uL (ref 4.0–10.5)

## 2015-06-20 LAB — COMPREHENSIVE METABOLIC PANEL
ALK PHOS: 80 U/L (ref 38–126)
ALT: 7 U/L — AB (ref 14–54)
AST: 9 U/L — ABNORMAL LOW (ref 15–41)
Albumin: 3.9 g/dL (ref 3.5–5.0)
Anion gap: 12 (ref 5–15)
BUN: 80 mg/dL — ABNORMAL HIGH (ref 6–20)
CO2: 18 mmol/L — ABNORMAL LOW (ref 22–32)
CREATININE: 6.76 mg/dL — AB (ref 0.44–1.00)
Calcium: 8.5 mg/dL — ABNORMAL LOW (ref 8.9–10.3)
Chloride: 107 mmol/L (ref 101–111)
GFR, EST AFRICAN AMERICAN: 8 mL/min — AB (ref >60)
GFR, EST NON AFRICAN AMERICAN: 7 mL/min — AB (ref >60)
Glucose, Bld: 160 mg/dL — ABNORMAL HIGH (ref 65–99)
Potassium: 3.2 mmol/L — ABNORMAL LOW (ref 3.5–5.1)
Sodium: 137 mmol/L (ref 135–145)
Total Bilirubin: 0.6 mg/dL (ref 0.3–1.2)
Total Protein: 7.2 g/dL (ref 6.5–8.1)

## 2015-06-20 LAB — LIPASE, BLOOD: Lipase: 11 U/L (ref 11–51)

## 2015-06-20 NOTE — ED Notes (Signed)
Patient reports 10+ episodes of emesis, generalized weakness and hyperglycemia.

## 2015-06-20 NOTE — ED Notes (Signed)
Patient presents from home via EMS for generalized weakness. Reports intermittent N/V x12 hours.

## 2015-06-20 NOTE — ED Notes (Signed)
Patient transported to X-ray 

## 2015-06-20 NOTE — ED Notes (Addendum)
Pt stated "I'm supposed to see a doctor for my stomach but they told me I have to bring $1,000.  I don't have a BM very often.  Usually when I come here they give me a soap suds enema.  Sometimes I don't have a BM for a month."

## 2015-06-20 NOTE — ED Notes (Signed)
Encouraged pt to provide urine specimen.  Pt stated "I can't right now."

## 2015-06-20 NOTE — ED Provider Notes (Signed)
CSN: [id number]     Arrival date & time 06/20/15  2132 History   First MD Initiated Contact with Patient 06/20/15 2219     Chief Complaint  Patient presents with  . Nausea  . Emesis     (Consider location/radiation/quality/duration/timing/severity/associated sxs/prior Treatment) HPI Comments: 45 year old female with a history of hypertension, diabetes mellitus, gastritis, chronic kidney disease, anemia, and chronic constipation presents to the ED for evaluation of nausea and vomiting. She reports that her symptoms began today. She has had more than 10 episodes of nonbloody, nonbilious emesis. She feels as though she is dehydrated and complains of generalized weakness. She also noted that it was difficult for her to control her blood sugars yesterday with CBG as high as 590. She has continued to administer insulin as prescribed and limit her food intake. She felt slightly bloated yesterday, but this has resolved. She has had no fever, syncope, chest pain, diarrhea, melena, hematochezia, dysuria, hematuria, or abdominal pain. Her last bowel movement was approximately 1.5 weeks ago. She reports normally going 1-2 months between bowel movements. She was previously on MiraLAX and a probiotic which she discontinued because she didn't feel as though it was helping her.  Patient is a 45 y.o. female presenting with vomiting. The history is provided by the patient. No language interpreter was used.  Emesis Associated symptoms: no abdominal pain     Past Medical History  Diagnosis Date  . Hypertension   . Diabetes mellitus without complication ([place]) 9381  . Gastritis   . Chronic kidney disease   . Acute kidney injury ([place])   . Anemia   . Renal cyst, left   . Constipation    Past Surgical History  Procedure Laterality Date  . Abscess drainage      Laparoscopic, abdominal   Family History  Problem Relation Age of Onset  . Hypertension Mother   . Diabetes Mother    Social History  Substance  Use Topics  . Smoking status: Former Smoker    Quit date: 02/26/2015  . Smokeless tobacco: Never Used  . Alcohol Use: No     Comment: less than a ppd, "not much"   OB History    Gravida Para Term Preterm AB TAB SAB Ectopic Multiple Living   0 0 0 0 0 0 0 0 0 0       Review of Systems  Gastrointestinal: Positive for nausea, vomiting and constipation. Negative for abdominal pain.  All other systems reviewed and are negative.   Allergies  Review of patient's allergies indicates no known allergies.  Home Medications   Prior to Admission medications   Medication Sig Start Date End Date Taking? Authorizing Provider  acetaminophen (TYLENOL) 500 MG tablet Take 1,000 mg by mouth every 6 (six) hours as needed for moderate pain or headache. Reported on 04/05/2015   Yes Historical Provider, MD  cloNIDine (CATAPRES) 0.3 MG tablet Take 1 tablet (0.3 mg total) by mouth 2 (two) times daily. 04/26/15  Yes Josalyn Funches, MD  diltiazem (CARDIZEM CD) 180 MG 24 hr capsule Take 2 capsules (360 mg total) by mouth daily. 04/26/15  Yes Josalyn Funches, MD  furosemide (LASIX) 40 MG tablet Take 1 tablet (40 mg total) by mouth daily. 04/26/15  Yes Josalyn Funches, MD  hydrALAZINE (APRESOLINE) 100 MG tablet Take 1 tablet (100 mg total) by mouth 3 (three) times daily. 04/26/15  Yes Josalyn Funches, MD  insulin glargine (LANTUS) 100 UNIT/ML injection Inject 0.04 mLs (4 Units total) into the skin at  bedtime. Patient taking differently: Inject 12 Units into the skin at bedtime.  04/29/15  Yes Orson Eva, MD  ondansetron (ZOFRAN) 4 MG tablet Take 1 tablet (4 mg total) by mouth every 8 (eight) hours as needed for nausea or vomiting. 04/29/15  Yes Orson Eva, MD  polyethylene glycol (MIRALAX / GLYCOLAX) packet Take 17 g by mouth daily. Patient taking differently: Take 17 g by mouth daily as needed for mild constipation or moderate constipation.  04/28/15  Yes Orson Eva, MD  senna (SENOKOT) 8.6 MG TABS tablet Take 2 tablets  (17.2 mg total) by mouth daily. 04/28/15  Yes Orson Eva, MD  Blood Glucose Monitoring Suppl (TRUE METRIX METER) w/Device KIT 1 each by Does not apply route 3 (three) times daily. 01/07/15   Josalyn Funches, MD  glucose blood (FREESTYLE TEST STRIPS) test strip Use as instructed 01/07/15   Adriana Mccallum Funches, MD  glucose blood (TRUE METRIX BLOOD GLUCOSE TEST) test strip Use as instructed 01/07/15   Boykin Nearing, MD  TRUEPLUS LANCETS 28G MISC 1 each by Does not apply route 3 (three) times daily. 01/07/15   Josalyn Funches, MD   BP 181/66 mmHg  Pulse 48  Temp(Src) 98.7 F (37.1 C) (Oral)  Resp 16  Ht 5' 2"  (1.575 m)  Wt 79.833 kg  BMI 32.18 kg/m2  SpO2 98%  LMP 04/19/2015   Physical Exam  Constitutional: She is oriented to person, place, and time. She appears well-developed and well-nourished. No distress.  Nontoxic appearing and in no distress.  HENT:  Head: Normocephalic and atraumatic.  Eyes: Conjunctivae and EOM are normal. No scleral icterus.  Neck: Normal range of motion.  Cardiovascular: Regular rhythm and intact distal pulses.   Bradycardic rate  Pulmonary/Chest: Effort normal and breath sounds normal. No respiratory distress. She has no wheezes. She has no rales.  Respirations even and unlabored. Lungs clear to auscultation bilaterally.  Abdominal: Soft. There is no tenderness. There is no rebound and no guarding.  Soft mildly distended abdomen. Patient has no areas of focal tenderness. There is no generalized tenderness, voluntary guarding, or masses. No peritoneal signs.  Musculoskeletal: Normal range of motion.  Neurological: She is alert and oriented to person, place, and time. She exhibits normal muscle tone. Coordination normal.  Skin: Skin is warm and dry. No rash noted. She is not diaphoretic. No erythema. No pallor.  Psychiatric: She has a normal mood and affect. Her behavior is normal.  Nursing note and vitals reviewed.   ED Course  Procedures (including critical care  time) Labs Review Labs Reviewed  COMPREHENSIVE METABOLIC PANEL - Abnormal; Notable for the following:    Potassium 3.2 (*)    CO2 18 (*)    Glucose, Bld 160 (*)    BUN 80 (*)    Creatinine, Ser 6.76 (*)    Calcium 8.5 (*)    AST 9 (*)    ALT 7 (*)    GFR calc non Af Amer 7 (*)    GFR calc Af Amer 8 (*)    All other components within normal limits  CBC - Abnormal; Notable for the following:    RBC 3.30 (*)    Hemoglobin 8.7 (*)    HCT 26.6 (*)    All other components within normal limits  LIPASE, BLOOD  URINALYSIS, ROUTINE W REFLEX MICROSCOPIC (NOT AT [facility])    Imaging Review Dg Abd 2 Views  06/20/2015  CLINICAL DATA:  45 year old female with nausea vomiting. EXAM: ABDOMEN - 2 VIEW COMPARISON:  Radiograph dated 04/27/2015 FINDINGS: Copious amount of dense stool noted throughout the colon. There is no evidence of bowel obstruction. No free air or radiopaque calculi. Mesenteric vascular calcification noted. The osseous structures appear intact. IMPRESSION: Severe Constipation.  No bowel obstruction. Electronically Signed   By: Anner Crete M.D.   On: 06/20/2015 23:29     I have personally reviewed and evaluated these images and lab results as part of my medical decision-making.   EKG Interpretation None      MDM   Final diagnoses:  Nausea & vomiting  Acute on chronic kidney failure ([place])    45 year old female presents to the emergency department for complaints of nausea and vomiting. She has not had any emesis while in the emergency department. Nausea controlled with Zofran. Patient has received 1 L of IV fluids as she feels dehydrated. Laboratory workup does, also, suggest dehydration as the patient's creatinine has continued to worsen from new baseline of 5.4 to 6.76 today. BUN has nearly doubled. Patient does report restarting her diuretic one month ago. Suspect that this is also contributing to patient's worsening kidney function. Doubt emergent cause of nausea and  vomiting. Patient has a soft and nontender abdomen. She has no fever or leukocytosis. No signs of obstruction on x-ray.  Patient be admitted to [facility] for further management of acute on chronic kidney failure. Case discussed with Dr. Tamala Julian who will admit.   Filed Vitals:   06/21/15 0030 06/21/15 0040 06/21/15 0100 06/21/15 0130  BP: 189/72 186/65 181/66 181/66  Pulse: 54 47 48 51  Temp:      TempSrc:      Resp:  16    Height:      Weight:      SpO2: 97% 96% 98% 98%     Antonietta Breach, PA-C 06/21/15 0202  Davonna Belling, MD 06/22/15 2139

## 2015-06-21 DIAGNOSIS — R1110 Vomiting, unspecified: Secondary | ICD-10-CM

## 2015-06-21 DIAGNOSIS — R112 Nausea with vomiting, unspecified: Secondary | ICD-10-CM

## 2015-06-21 DIAGNOSIS — E119 Type 2 diabetes mellitus without complications: Secondary | ICD-10-CM

## 2015-06-21 DIAGNOSIS — E1122 Type 2 diabetes mellitus with diabetic chronic kidney disease: Secondary | ICD-10-CM

## 2015-06-21 DIAGNOSIS — Z794 Long term (current) use of insulin: Secondary | ICD-10-CM

## 2015-06-21 DIAGNOSIS — N189 Chronic kidney disease, unspecified: Secondary | ICD-10-CM

## 2015-06-21 DIAGNOSIS — E876 Hypokalemia: Secondary | ICD-10-CM

## 2015-06-21 DIAGNOSIS — N179 Acute kidney failure, unspecified: Secondary | ICD-10-CM | POA: Insufficient documentation

## 2015-06-21 LAB — URINALYSIS, ROUTINE W REFLEX MICROSCOPIC
Bilirubin Urine: NEGATIVE
GLUCOSE, UA: NEGATIVE mg/dL
Hgb urine dipstick: NEGATIVE
KETONES UR: NEGATIVE mg/dL
LEUKOCYTES UA: NEGATIVE
Nitrite: NEGATIVE
PH: 5.5 (ref 5.0–8.0)
Protein, ur: >300 mg/dL — AB
SPECIFIC GRAVITY, URINE: 1.014 (ref 1.005–1.030)

## 2015-06-21 LAB — URINE MICROSCOPIC-ADD ON: RBC / HPF: NONE SEEN RBC/hpf (ref 0–5)

## 2015-06-21 LAB — BASIC METABOLIC PANEL
ANION GAP: 9 (ref 5–15)
BUN: 73 mg/dL — AB (ref 6–20)
CO2: 19 mmol/L — ABNORMAL LOW (ref 22–32)
Calcium: 8.5 mg/dL — ABNORMAL LOW (ref 8.9–10.3)
Chloride: 112 mmol/L — ABNORMAL HIGH (ref 101–111)
Creatinine, Ser: 6.65 mg/dL — ABNORMAL HIGH (ref 0.44–1.00)
GFR calc Af Amer: 8 mL/min — ABNORMAL LOW (ref >60)
GFR, EST NON AFRICAN AMERICAN: 7 mL/min — AB (ref >60)
Glucose, Bld: 54 mg/dL — ABNORMAL LOW (ref 65–99)
POTASSIUM: 3.7 mmol/L (ref 3.5–5.1)
SODIUM: 140 mmol/L (ref 135–145)

## 2015-06-21 LAB — CBC
HEMATOCRIT: 26.5 % — AB (ref 36.0–46.0)
Hemoglobin: 8.6 g/dL — ABNORMAL LOW (ref 12.0–15.0)
MCH: 26.9 pg (ref 26.0–34.0)
MCHC: 32.5 g/dL (ref 30.0–36.0)
MCV: 82.8 fL (ref 78.0–100.0)
PLATELETS: 216 10*3/uL (ref 150–400)
RBC: 3.2 MIL/uL — ABNORMAL LOW (ref 3.87–5.11)
RDW: 15.4 % (ref 11.5–15.5)
WBC: 5.8 10*3/uL (ref 4.0–10.5)

## 2015-06-21 LAB — GLUCOSE, CAPILLARY
GLUCOSE-CAPILLARY: 114 mg/dL — AB (ref 65–99)
GLUCOSE-CAPILLARY: 154 mg/dL — AB (ref 65–99)
Glucose-Capillary: 55 mg/dL — ABNORMAL LOW (ref 65–99)
Glucose-Capillary: 256 mg/dL — ABNORMAL HIGH (ref 65–99)
Glucose-Capillary: 214 mg/dL — ABNORMAL HIGH (ref 65–99)

## 2015-06-21 MED ORDER — MINERAL OIL RE ENEM
1.0000 | ENEMA | Freq: Two times a day (BID) | RECTAL | Status: DC
Start: 2015-06-21 — End: 2015-06-21
  Filled 2015-06-21 (×2): qty 1

## 2015-06-21 MED ORDER — ONDANSETRON HCL 4 MG PO TABS: 4.0000 mg | ORAL_TABLET | Freq: Four times a day (QID) | ORAL | Status: DC | PRN

## 2015-06-21 MED ORDER — PROCHLORPERAZINE EDISYLATE 5 MG/ML IJ SOLN
10.0000 mg | Freq: Four times a day (QID) | INTRAMUSCULAR | Status: DC | PRN
  Administered 2015-06-23: 10 mg via INTRAVENOUS
  Filled 2015-06-21: qty 2

## 2015-06-21 MED ORDER — INSULIN GLARGINE 100 UNIT/ML ~~LOC~~ SOLN: 12.0000 [IU] | Freq: Every day | SUBCUTANEOUS | Status: DC

## 2015-06-21 MED ORDER — INSULIN ASPART 100 UNIT/ML ~~LOC~~ SOLN
0.0000 [IU] | Freq: Three times a day (TID) | SUBCUTANEOUS | Status: DC
  Administered 2015-06-21: 5 [IU] via SUBCUTANEOUS
  Administered 2015-06-21 – 2015-06-22 (×2): 2 [IU] via SUBCUTANEOUS
  Administered 2015-06-22: 3 [IU] via SUBCUTANEOUS
  Administered 2015-06-22: 7 [IU] via SUBCUTANEOUS
  Administered 2015-06-23: 2 [IU] via SUBCUTANEOUS
  Administered 2015-06-23 (×2): 3 [IU] via SUBCUTANEOUS
  Administered 2015-06-24: 2 [IU] via SUBCUTANEOUS

## 2015-06-21 MED ORDER — SORBITOL 70 % SOLN: 960.0000 mL | TOPICAL_OIL | Freq: Two times a day (BID) | ORAL | Status: DC

## 2015-06-21 MED ORDER — ALBUTEROL SULFATE (2.5 MG/3ML) 0.083% IN NEBU: 2.5000 mg | INHALATION_SOLUTION | RESPIRATORY_TRACT | Status: DC | PRN

## 2015-06-21 MED ORDER — POLYETHYLENE GLYCOL 3350 17 G PO PACK
17.0000 g | PACK | Freq: Every day | ORAL | Status: DC
  Filled 2015-06-21: qty 1

## 2015-06-21 MED ORDER — ACETAMINOPHEN 325 MG PO TABS
650.0000 mg | ORAL_TABLET | Freq: Four times a day (QID) | ORAL | Status: DC | PRN
  Administered 2015-06-21 – 2015-06-24 (×6): 650 mg via ORAL
  Filled 2015-06-21 (×7): qty 2

## 2015-06-21 MED ORDER — ONDANSETRON HCL 4 MG/2ML IJ SOLN
4.0000 mg | Freq: Once | INTRAMUSCULAR | Status: AC
  Administered 2015-06-21: 4 mg via INTRAVENOUS
  Filled 2015-06-21: qty 2

## 2015-06-21 MED ORDER — CLONIDINE HCL 0.3 MG PO TABS
0.3000 mg | ORAL_TABLET | Freq: Two times a day (BID) | ORAL | Status: DC
  Administered 2015-06-21 – 2015-06-24 (×6): 0.3 mg via ORAL
  Filled 2015-06-21 (×8): qty 1

## 2015-06-21 MED ORDER — PROCHLORPERAZINE EDISYLATE 5 MG/ML IJ SOLN
10.0000 mg | INTRAMUSCULAR | Status: AC
  Administered 2015-06-21: 10 mg via INTRAVENOUS
  Filled 2015-06-21: qty 2

## 2015-06-21 MED ORDER — SENNA 8.6 MG PO TABS
2.0000 | ORAL_TABLET | Freq: Every day | ORAL | Status: DC
  Administered 2015-06-21 – 2015-06-23 (×3): 17.2 mg via ORAL
  Filled 2015-06-21 (×2): qty 2

## 2015-06-21 MED ORDER — POTASSIUM CHLORIDE 10 MEQ/100ML IV SOLN
10.0000 meq | INTRAVENOUS | Status: AC
  Administered 2015-06-21 (×3): 10 meq via INTRAVENOUS
  Filled 2015-06-21 (×3): qty 100

## 2015-06-21 MED ORDER — DILTIAZEM HCL ER COATED BEADS 360 MG PO CP24
360.0000 mg | ORAL_CAPSULE | Freq: Every day | ORAL | Status: DC
  Administered 2015-06-21 – 2015-06-24 (×4): 360 mg via ORAL
  Filled 2015-06-21 (×4): qty 1

## 2015-06-21 MED ORDER — HYDRALAZINE HCL 50 MG PO TABS
100.0000 mg | ORAL_TABLET | Freq: Three times a day (TID) | ORAL | Status: DC
  Administered 2015-06-21 – 2015-06-24 (×10): 100 mg via ORAL
  Filled 2015-06-21 (×14): qty 2

## 2015-06-21 MED ORDER — ACETAMINOPHEN 650 MG RE SUPP: 650.0000 mg | Freq: Four times a day (QID) | RECTAL | Status: DC | PRN

## 2015-06-21 MED ORDER — SODIUM CHLORIDE 0.9 % IV SOLN
INTRAVENOUS | Status: DC
  Administered 2015-06-21: 03:00:00 via INTRAVENOUS

## 2015-06-21 MED ORDER — LIP MEDEX EX OINT
TOPICAL_OINTMENT | CUTANEOUS | Status: AC
  Administered 2015-06-21: 16:00:00
  Filled 2015-06-21: qty 7

## 2015-06-21 MED ORDER — ONDANSETRON HCL 4 MG/2ML IJ SOLN
4.0000 mg | Freq: Four times a day (QID) | INTRAMUSCULAR | Status: DC | PRN
  Administered 2015-06-23: 4 mg via INTRAVENOUS
  Filled 2015-06-21: qty 2

## 2015-06-21 MED ORDER — SODIUM CHLORIDE 0.9 % IV BOLUS (SEPSIS)
1000.0000 mL | Freq: Once | INTRAVENOUS | Status: AC
  Administered 2015-06-21: 1000 mL via INTRAVENOUS

## 2015-06-21 MED ORDER — HEPARIN SODIUM (PORCINE) 5000 UNIT/ML IJ SOLN
5000.0000 [IU] | Freq: Three times a day (TID) | INTRAMUSCULAR | Status: DC
  Filled 2015-06-21 (×13): qty 1

## 2015-06-21 MED ORDER — POLYETHYLENE GLYCOL 3350 17 G PO PACK
17.0000 g | PACK | Freq: Two times a day (BID) | ORAL | Status: DC
  Filled 2015-06-21: qty 1

## 2015-06-21 NOTE — H&P (Signed)
History and Physical    Lekesha Claw KLK:[id number] DOB: 1970/09/27 DOA: 06/20/2015  Referring MD/NP/PA: Antonietta Breach, PA-C PCP: Minerva Ends, MD  Patient coming from: Home  Chief Complaint: Vomiting  HPI: Crystal Tanner is a 45 y.o. female with medical history significant of HTN, diabetes mellitus type 2, chronic kidney disease stage III, anemia, and chronic constipation; who presents with complaints of nausea and vomiting. She notes acute onset of symptoms yesterday afternoon around 12 PM. She's had approximately 10 episodes of vomiting since that time. Vomitus is clear and foamy color. Normally when this occurs she vomits up bile. She did not try anything to relieve symptoms as she reports she did not have any medications like Zofran. Associated symptoms include some chills, fluid retention, and constipation which she notes is chronic. Last bowel movement was sometime last week. Reports using a stool softener, but cannot remember the name and states that it does not help. Also complains of feeling like she is having some fluid retention for which she continued using her diuretic despite being advised to discontinue it during her last hospital discharge. Denies any abdominal pain, diarrhea, fever, chest pain, shortness of breath. Over the last week she's had difficulty controlling her blood sugars noting that she's had significant lows in the 50s to 60s for which she did not take her Lantus.   ED Course: Upon admission into the emergency department patient was evaluated and seen to be afebrile, heart rates ranging from 47-51, blood pressure as high as 189/72, respirations within normal limits. Lab work revealed hemoglobin of 8.7, potassium 3.2, BUN 80, creatinine 6.76, glucose 160. Acute abdominal series revealed severe constipation, but no signs of bowel obstruction..  Review of Systems: As per HPI otherwise 10 point review of systems negative.   Past Medical History  Diagnosis Date  .  Hypertension   . Diabetes mellitus without complication ([place]) 9794  . Gastritis   . Chronic kidney disease   . Acute kidney injury ([place])   . Anemia   . Renal cyst, left   . Constipation     Past Surgical History  Procedure Laterality Date  . Abscess drainage      Laparoscopic, abdominal     reports that she quit smoking about 3 months ago. She has never used smokeless tobacco. She reports that she uses illicit drugs (Marijuana). She reports that she does not drink alcohol.  No Known Allergies  Family History  Problem Relation Age of Onset  . Hypertension Mother   . Diabetes Mother     Prior to Admission medications   Medication Sig Start Date End Date Taking? Authorizing Provider  acetaminophen (TYLENOL) 500 MG tablet Take 1,000 mg by mouth every 6 (six) hours as needed for moderate pain or headache. Reported on 04/05/2015   Yes Historical Provider, MD  cloNIDine (CATAPRES) 0.3 MG tablet Take 1 tablet (0.3 mg total) by mouth 2 (two) times daily. 04/26/15  Yes Josalyn Funches, MD  diltiazem (CARDIZEM CD) 180 MG 24 hr capsule Take 2 capsules (360 mg total) by mouth daily. 04/26/15  Yes Josalyn Funches, MD  furosemide (LASIX) 40 MG tablet Take 1 tablet (40 mg total) by mouth daily. 04/26/15  Yes Josalyn Funches, MD  hydrALAZINE (APRESOLINE) 100 MG tablet Take 1 tablet (100 mg total) by mouth 3 (three) times daily. 04/26/15  Yes Josalyn Funches, MD  insulin glargine (LANTUS) 100 UNIT/ML injection Inject 0.04 mLs (4 Units total) into the skin at bedtime. Patient taking differently: Inject 12  Units into the skin at bedtime.  04/29/15  Yes Orson Eva, MD  ondansetron (ZOFRAN) 4 MG tablet Take 1 tablet (4 mg total) by mouth every 8 (eight) hours as needed for nausea or vomiting. 04/29/15  Yes Orson Eva, MD  polyethylene glycol (MIRALAX / GLYCOLAX) packet Take 17 g by mouth daily. Patient taking differently: Take 17 g by mouth daily as needed for mild constipation or moderate constipation.   04/28/15  Yes Orson Eva, MD  senna (SENOKOT) 8.6 MG TABS tablet Take 2 tablets (17.2 mg total) by mouth daily. 04/28/15  Yes Orson Eva, MD  Blood Glucose Monitoring Suppl (TRUE METRIX METER) w/Device KIT 1 each by Does not apply route 3 (three) times daily. 01/07/15   Josalyn Funches, MD  glucose blood (FREESTYLE TEST STRIPS) test strip Use as instructed 01/07/15   Adriana Mccallum Funches, MD  glucose blood (TRUE METRIX BLOOD GLUCOSE TEST) test strip Use as instructed 01/07/15   Boykin Nearing, MD  TRUEPLUS LANCETS 28G MISC 1 each by Does not apply route 3 (three) times daily. 01/07/15   Boykin Nearing, MD    Physical Exam: Filed Vitals:   06/21/15 0030 06/21/15 0040 06/21/15 0100 06/21/15 0130  BP: 189/72 186/65 181/66 181/66  Pulse: 54 47 48 51  Temp:      TempSrc:      Resp:  16    Height:      Weight:      SpO2: 97% 96% 98% 98%      Constitutional: Middle-aged female appears in some mild distress wrapped up in a ball on hospital gurney Filed Vitals:   06/21/15 0030 06/21/15 0040 06/21/15 0100 06/21/15 0130  BP: 189/72 186/65 181/66 181/66  Pulse: 54 47 48 51  Temp:      TempSrc:      Resp:  16    Height:      Weight:      SpO2: 97% 96% 98% 98%   Eyes: PERRL, lids and conjunctivae normal ENMT: Mucous membranes are Dry. Posterior pharynx clear of any exudate or lesions.Normal dentition.  Neck: normal, supple, no masses, no thyromegaly Respiratory: clear to auscultation bilaterally, no wheezing, no crackles. Normal respiratory effort. No accessory muscle use.  Cardiovascular: Bradycardic, no murmurs / rubs / gallops. No extremity edema. 2+ pedal pulses. No carotid bruits.  Abdomen: no tenderness, but abdomen is mildly distended. No peritoneal signs appreciated. No hepatosplenomegaly. Bowel sounds slightly decreased  Musculoskeletal: no clubbing / cyanosis. No joint deformity upper and lower extremities. Good ROM, no contractures. Normal muscle tone.  Skin: no rashes, lesions, ulcers. No  induration Neurologic: CN 2-12 grossly intact. Sensation intact, DTR normal. Strength 5/5 in all 4.  Psychiatric: Normal judgment and insight. Alert and oriented x 3. Normal mood.     Labs on Admission: I have personally reviewed following labs and imaging studies  CBC:  Recent Labs Lab 06/20/15 2221  WBC 6.1  HGB 8.7*  HCT 26.6*  MCV 80.6  PLT 793   Basic Metabolic Panel:  Recent Labs Lab 06/20/15 2221  NA 137  K 3.2*  CL 107  CO2 18*  GLUCOSE 160*  BUN 80*  CREATININE 6.76*  CALCIUM 8.5*   GFR: Estimated Creatinine Clearance: 10.3 mL/min (by C-G formula based on Cr of 6.76). Liver Function Tests:  Recent Labs Lab 06/20/15 2221  AST 9*  ALT 7*  ALKPHOS 80  BILITOT 0.6  PROT 7.2  ALBUMIN 3.9    Recent Labs Lab 06/20/15 2221  LIPASE  11   No results for input(s): AMMONIA in the last 168 hours. Coagulation Profile: No results for input(s): INR, PROTIME in the last 168 hours. Cardiac Enzymes: No results for input(s): CKTOTAL, CKMB, CKMBINDEX, TROPONINI in the last 168 hours. BNP (last 3 results) No results for input(s): PROBNP in the last 8760 hours. HbA1C: No results for input(s): HGBA1C in the last 72 hours. CBG: No results for input(s): GLUCAP in the last 168 hours. Lipid Profile: No results for input(s): CHOL, HDL, LDLCALC, TRIG, CHOLHDL, LDLDIRECT in the last 72 hours. Thyroid Function Tests: No results for input(s): TSH, T4TOTAL, FREET4, T3FREE, THYROIDAB in the last 72 hours. Anemia Panel: No results for input(s): VITAMINB12, FOLATE, FERRITIN, TIBC, IRON, RETICCTPCT in the last 72 hours. Urine analysis:    Component Value Date/Time   COLORURINE YELLOW 04/27/2015 1735   APPEARANCEUR CLOUDY* 04/27/2015 1735   LABSPEC 1.015 04/27/2015 1735   PHURINE 5.0 04/27/2015 1735   GLUCOSEU NEGATIVE 04/27/2015 1735   HGBUR NEGATIVE 04/27/2015 1735   BILIRUBINUR NEGATIVE 04/27/2015 1735   KETONESUR NEGATIVE 04/27/2015 1735   PROTEINUR >300*  04/27/2015 1735   UROBILINOGEN 0.2 09/08/2014 1400   NITRITE NEGATIVE 04/27/2015 1735   LEUKOCYTESUR NEGATIVE 04/27/2015 1735   Sepsis Labs: No results found for this or any previous visit (from the past 240 hour(s)).   Radiological Exams on Admission: Dg Abd 2 Views  06/20/2015  CLINICAL DATA:  45 year old female with nausea vomiting. EXAM: ABDOMEN - 2 VIEW COMPARISON:  Radiograph dated 04/27/2015 FINDINGS: Copious amount of dense stool noted throughout the colon. There is no evidence of bowel obstruction. No free air or radiopaque calculi. Mesenteric vascular calcification noted. The osseous structures appear intact. IMPRESSION: Severe Constipation.  No bowel obstruction. Electronically Signed   By: Anner Crete M.D.   On: 06/20/2015 23:29      Assessment/Plan Acute renal failure on chronic kidney disease stage IV: Acute. Baseline creatinine 4.6-5 and found to be acutely worsened on admission to 6.76 to BUN of 80. Suspect patient recent use of diuretics despite being advised to discontinue as an added contributor. Patient still reports being able to make urine at this time. - Admit to a MedSurg bed - Strict ins and outs  - IV fluids of normal saline - Discontinued Lasix 40 - Will need to consult nephrology in a.m.  Nausea and vomiting: Acute. Possibly caused by gastroparesis from patient's brother controlled diabetes. - Zofran/Compazine  Chronic constipation: Acute on chronic. Patient last bowel movement noted to be some time last week. No signs of possible bowel obstruction on imaging. - Continue senna and MiraLAX - May consider GoLYTELY or enema to help assist  Essential hypertension: Uncontrolled. Blood pressure as high as 189/79 - Continue hydralazine, clonidine, and Cardizem -  follow-up telemetry regarding bradycardia   Diabetes mellitus type 2: Last hemoglobin A1c was 5.9 and 04/2015 question need of - Did not restart Lantus 12 units at bedtime - Hypoglycemic  protocols - CBGs with sensitive a sliding scale insulin every meal  Hypokalemia: Acute. Initial potassium 3.2 on admission - IV potassium chloride 40 mEq - Continue to monitor and replace as needed  Anemia of chronic disease: Stable. Hemoglobin slightly improved from previous 8.9 - Continue to monitor  DVT prophylaxis: Heparin Code Status: Full Family Communication: None Disposition Plan: Likely discharge home in 1-2 days Consults called: None Admission status: Observation MedSurg   Norval Morton MD [facility] Pager [phone]  If 7PM-7AM, please contact night-coverage [email or web address] Password [email or web address]  06/21/2015, 2:18 AM

## 2015-06-21 NOTE — ED Notes (Signed)
Encouraged pt to provide urine specimen.  Pt stated "I can't.  I'm sleepy."

## 2015-06-21 NOTE — Progress Notes (Addendum)
PROGRESS NOTE    Crystal Tanner  ZOX:[id number] DOB: [birth year] DOA: 06/20/2015  PCP: Lora Paula, MD   Brief Narrative:  45 y/o HTN, DM 2, CKD 3, chronic constipation who presents with vomiting up to 10 episodes yesterday. Found to have severe constipation and AKI. Last admitted for the same in April. Notes from last admission state that she is resistant to taking a bowel regimen at home.   Subjective: States no laxatives have ever worked for her. Takes Miralax and Senna. Tells me that the oily enema usually helps. Still does not appear open to trying a laxative regimen.   Assessment & Plan:   Principal Problem:   Constipation resulting in nausea and vomiting - this is the cause of this admission and will continue to occur until she agrees to a bowel regimen at home - start enemas - cont BID Miralax and Senna - NPO  Active Problems:  Acute renal failure on CKD 5 - she is making urine but it is not being measured- have asked for I and O -pre-renal and ATN - granular casts on UA - hold lasix- hydrate- if no improvement, call renal   H/o gastroparesis per chart - may need to try reglan once constipation resolves  Proteinuria - repeat UA tomorrow  Controlled diabetes mellitus    - Insulin sliding scale- takes 12 U Lantus which is on hold- cont to hold as CBG 55 this AM  HTN - Cardizem, clonidine, hydralizine    Anemia, chronic renal failure - follow with hydration- not sure if she has been receiving procrit as outpt - anemia panal        Hypokalemia - treated  DVT prophylaxis: heparin Code Status: full code Family Communication:  Disposition Plan: home when stable Consultants:   none Procedures:   none Antimicrobials:  Anti-infectives    None       Objective: Filed Vitals:   06/21/15 0300 06/21/15 0305 06/21/15 0349 06/21/15 1333  BP: 157/52 157/52 153/49 119/58  Pulse: 46 52 48 50  Temp:  98.6 F (37 C) 98.8 F (37.1 C) 99.5 F (37.5 C)   TempSrc:  Oral Oral Oral  Resp:  Height:      Weight:      SpO2: 98% 100% 98% 100%    Intake/Output Summary (Last 24 hours) at 06/21/15 1434 Last data filed at 06/21/15 0900  Gross per 24 hour  Intake   1475 ml  Output      0 ml  Net   1475 ml   Filed Weights   06/20/15 2137  Weight: 79.833 kg (176 lb)    Examination: General exam: Appears comfortable  HEENT: PERRLA, oral mucosa moist, no sclera icterus or thrush Respiratory system: Clear to auscultation. Respiratory effort normal. Cardiovascular system: S1 & S2 heard, RRR.  No murmurs  Gastrointestinal system: Abdomen soft, diffusely tender and distended. Normal bowel sound. No organomegaly Central nervous system: Alert and oriented. No focal neurological deficits. Extremities: No cyanosis, clubbing or edema Skin: No rashes or ulcers Psychiatry:  Mood & affect appropriate.     Data Reviewed: I have personally reviewed following labs and imaging studies  CBC:  Recent Labs Lab 06/20/15 2221 06/21/15 0757  WBC 6.1 5.8  HGB 8.7* 8.6*  HCT 26.6* 26.5*  MCV 80.6 82.8  PLT 215 216   Basic Metabolic Panel:  Recent Labs Lab 06/20/15 2221 06/21/15 0757  NA 137 140  K 3.2* 3.7  CL 107 112*  CO2 18* 19*  GLUCOSE 160* 54*  BUN 80* 73*  CREATININE 6.76* 6.65*  CALCIUM 8.5* 8.5*   GFR: Estimated Creatinine Clearance: 10.5 mL/min (by C-G formula based on Cr of 6.65). Liver Function Tests:  Recent Labs Lab 06/20/15 2221  AST 9*  ALT 7*  ALKPHOS 80  BILITOT 0.6  PROT 7.2  ALBUMIN 3.9    Recent Labs Lab 06/20/15 2221  LIPASE 11   No results for input(s): AMMONIA in the last 168 hours. Coagulation Profile: No results for input(s): INR, PROTIME in the last 168 hours. Cardiac Enzymes: No results for input(s): CKTOTAL, CKMB, CKMBINDEX, TROPONINI in the last 168 hours. BNP (last 3 results) No results for input(s): PROBNP in the last 8760 hours. HbA1C: No results for input(s): HGBA1C in the  last 72 hours. CBG:  Recent Labs Lab 06/21/15 0807 06/21/15 0855 06/21/15 1119  GLUCAP 55* 114* 256*   Lipid Profile: No results for input(s): CHOL, HDL, LDLCALC, TRIG, CHOLHDL, LDLDIRECT in the last 72 hours. Thyroid Function Tests: No results for input(s): TSH, T4TOTAL, FREET4, T3FREE, THYROIDAB in the last 72 hours. Anemia Panel: No results for input(s): VITAMINB12, FOLATE, FERRITIN, TIBC, IRON, RETICCTPCT in the last 72 hours. Urine analysis:    Component Value Date/Time   COLORURINE YELLOW 06/21/2015 0200   APPEARANCEUR CLOUDY* 06/21/2015 0200   LABSPEC 1.014 06/21/2015 0200   PHURINE 5.5 06/21/2015 0200   GLUCOSEU NEGATIVE 06/21/2015 0200   HGBUR NEGATIVE 06/21/2015 0200   BILIRUBINUR NEGATIVE 06/21/2015 0200   KETONESUR NEGATIVE 06/21/2015 0200   PROTEINUR >300* 06/21/2015 0200   UROBILINOGEN 0.2 09/08/2014 1400   NITRITE NEGATIVE 06/21/2015 0200   LEUKOCYTESUR NEGATIVE 06/21/2015 0200   Sepsis Labs: @LABRCNTIP (procalcitonin:4,lacticidven:4) )No results found for this or any previous visit (from the past 240 hour(s)).       Radiology Studies: Dg Abd 2 Views  06/20/2015  CLINICAL DATA:  45 year old female with nausea vomiting. EXAM: ABDOMEN - 2 VIEW COMPARISON:  Radiograph dated 04/27/2015 FINDINGS: Copious amount of dense stool noted throughout the colon. There is no evidence of bowel obstruction. No free air or radiopaque calculi. Mesenteric vascular calcification noted. The osseous structures appear intact. IMPRESSION: Severe Constipation.  No bowel obstruction. Electronically Signed   By: Elgie CollardArash  Radparvar M.D.   On: 06/20/2015 23:29      Scheduled Meds: . cloNIDine  0.3 mg Oral BID  . diltiazem  360 mg Oral Daily  . heparin  5,000 Units Subcutaneous Q8H  . hydrALAZINE  100 mg Oral TID  . insulin aspart  0-9 Units Subcutaneous TID WC  . mineral oil  1 enema Rectal BID  . polyethylene glycol  17 g Oral BID  . senna  2 tablet Oral Daily   Continuous  Infusions: . sodium chloride 100 mL/hr at 06/21/15 0328        Time spent in minutes: 35    Davianna Deutschman, MD [facility] Pager: [email or web address] Password [email or web address] 06/21/2015, 2:34 PM

## 2015-06-21 NOTE — ED Notes (Signed)
Pt again encouraged to provide urine specimen.  Pt stated "I will when I can."

## 2015-06-22 ENCOUNTER — Encounter (HOSPITAL_COMMUNITY): Payer: Self-pay

## 2015-06-22 DIAGNOSIS — E118 Type 2 diabetes mellitus with unspecified complications: Secondary | ICD-10-CM

## 2015-06-22 DIAGNOSIS — E1165 Type 2 diabetes mellitus with hyperglycemia: Secondary | ICD-10-CM

## 2015-06-22 LAB — URINALYSIS, ROUTINE W REFLEX MICROSCOPIC
Bilirubin Urine: NEGATIVE
GLUCOSE, UA: 500 mg/dL — AB
HGB URINE DIPSTICK: NEGATIVE
Ketones, ur: NEGATIVE mg/dL
LEUKOCYTES UA: NEGATIVE
Nitrite: NEGATIVE
PH: 5.5 (ref 5.0–8.0)
Specific Gravity, Urine: 1.014 (ref 1.005–1.030)

## 2015-06-22 LAB — CBC
HEMATOCRIT: 24.8 % — AB (ref 36.0–46.0)
HEMOGLOBIN: 8 g/dL — AB (ref 12.0–15.0)
MCH: 27 pg (ref 26.0–34.0)
MCHC: 32.3 g/dL (ref 30.0–36.0)
MCV: 83.8 fL (ref 78.0–100.0)
Platelets: 189 10*3/uL (ref 150–400)
RBC: 2.96 MIL/uL — ABNORMAL LOW (ref 3.87–5.11)
RDW: 15.7 % — AB (ref 11.5–15.5)
WBC: 5.8 10*3/uL (ref 4.0–10.5)

## 2015-06-22 LAB — URINE MICROSCOPIC-ADD ON

## 2015-06-22 LAB — BASIC METABOLIC PANEL
ANION GAP: 9 (ref 5–15)
BUN: 65 mg/dL — AB (ref 6–20)
CO2: 18 mmol/L — AB (ref 22–32)
Calcium: 8 mg/dL — ABNORMAL LOW (ref 8.9–10.3)
Chloride: 111 mmol/L (ref 101–111)
Creatinine, Ser: 6.07 mg/dL — ABNORMAL HIGH (ref 0.44–1.00)
GFR calc Af Amer: 9 mL/min — ABNORMAL LOW (ref >60)
GFR, EST NON AFRICAN AMERICAN: 8 mL/min — AB (ref >60)
GLUCOSE: 218 mg/dL — AB (ref 65–99)
POTASSIUM: 3.9 mmol/L (ref 3.5–5.1)
Sodium: 138 mmol/L (ref 135–145)

## 2015-06-22 LAB — IRON AND TIBC
IRON: 44 ug/dL (ref 28–170)
Saturation Ratios: 15 % (ref 10.4–31.8)
TIBC: 302 ug/dL (ref 250–450)
UIBC: 258 ug/dL

## 2015-06-22 LAB — RETICULOCYTES
RBC.: 3.14 MIL/uL — AB (ref 3.87–5.11)
RETIC COUNT ABSOLUTE: 37.7 10*3/uL (ref 19.0–186.0)
Retic Ct Pct: 1.2 % (ref 0.4–3.1)

## 2015-06-22 LAB — FERRITIN: Ferritin: 10 ng/mL — ABNORMAL LOW (ref 11–307)

## 2015-06-22 LAB — GLUCOSE, CAPILLARY
GLUCOSE-CAPILLARY: 332 mg/dL — AB (ref 65–99)
GLUCOSE-CAPILLARY: 210 mg/dL — AB (ref 65–99)
GLUCOSE-CAPILLARY: 178 mg/dL — AB (ref 65–99)
Glucose-Capillary: 164 mg/dL — ABNORMAL HIGH (ref 65–99)

## 2015-06-22 LAB — FOLATE: FOLATE: 7.9 ng/mL (ref >5.9)

## 2015-06-22 LAB — PHOSPHORUS: PHOSPHORUS: 5.1 mg/dL — AB (ref 2.5–4.6)

## 2015-06-22 LAB — VITAMIN B12: Vitamin B-12: 512 pg/mL (ref 180–914)

## 2015-06-22 MED ORDER — DARBEPOETIN ALFA 200 MCG/0.4ML IJ SOSY
200.0000 ug | PREFILLED_SYRINGE | Freq: Once | INTRAMUSCULAR | Status: AC
  Administered 2015-06-22: 200 ug via SUBCUTANEOUS
  Filled 2015-06-22: qty 0.4

## 2015-06-22 MED ORDER — INSULIN ASPART 100 UNIT/ML ~~LOC~~ SOLN
3.0000 [IU] | Freq: Three times a day (TID) | SUBCUTANEOUS | Status: DC
  Administered 2015-06-22 – 2015-06-24 (×6): 3 [IU] via SUBCUTANEOUS

## 2015-06-22 MED ORDER — RENA-VITE PO TABS
1.0000 | ORAL_TABLET | Freq: Every day | ORAL | Status: DC
  Administered 2015-06-22 – 2015-06-23 (×2): 1 via ORAL
  Filled 2015-06-22 (×3): qty 1

## 2015-06-22 MED ORDER — SODIUM BICARBONATE 650 MG PO TABS
650.0000 mg | ORAL_TABLET | Freq: Three times a day (TID) | ORAL | Status: DC
  Administered 2015-06-22 – 2015-06-24 (×6): 650 mg via ORAL
  Filled 2015-06-22 (×8): qty 1

## 2015-06-22 MED ORDER — INSULIN GLARGINE 100 UNIT/ML ~~LOC~~ SOLN
10.0000 [IU] | Freq: Every day | SUBCUTANEOUS | Status: DC
  Administered 2015-06-22 – 2015-06-23 (×2): 10 [IU] via SUBCUTANEOUS
  Filled 2015-06-22 (×2): qty 0.1

## 2015-06-22 NOTE — Hospital Discharge Follow-Up (Addendum)
Transitional Care Clinic Care Coordination Note:  Admit date:  06/20/15 Discharge date: To be determined. Discharge Disposition: Home when stable Patient contact: [phone] (cell); [phone] (home) Emergency contact(s): Baili Stang [phone])  This Case Manager reviewed patient's EMR and determined patient would benefit from post-discharge medical management and chronic care management services through the [facility]. Patient has a history of Chronic Kidney Disease Stage 5, HTN, DM, chronic constipation. This Case Manager met with patient to discuss the services and medical management that can be provided at the [facility]. Patient verbalized understanding and agreed to receive post-discharge care at the [facility].   Patient scheduled for Transitional Care appointment on 06/28/15 at 1045 with Dr. Jarold Song.  Clinic information and appointment time provided to patient. Appointment information also placed on AVS.  Assessment:       Home Environment: Patient recently moved to [place] from [place] in 10/2014. Patient lives with her father in a private residence.       Support System: father-Robert Barfuss       Level of functioning: Independent       Home DME: No assistive devices used for ambulation. Patient indicates she has a blood pressure cuff at home as well as glucometer and diabetes testing supplies.       Home care services: none       Transportation: Patient indicates she typically drives to her medical appointments. She also indicates her father able to drive her to medical appointments if needed.        Food/Nutrition: Patient indicates she has access to needed food.        Medications: Patient gets her medications from the pharmacy at [facility]. She denies problems obtaining or affording needed medications.        Identified Barriers: uninsured-Patient indicates she has met with Publishing copy at [facility] and is missing one document so [employer] application is still pending. This Case Manager will follow-up with Development worker, community at [facility] to determine what is needed for application to be completed. Will follow-up with patient when information obtained. Patient does indicate she is able to afford out of pocket cost for Nephrology appointment.        PCP: Dr. Lenard Galloway [facility] services:        Services communicated to Velva Harman, RN CM

## 2015-06-22 NOTE — Plan of Care (Signed)
Problem: Food- and Nutrition-Related Knowledge Deficit (NB-1.1) Goal: Nutrition education Formal process to instruct or train a patient/client in a skill or to impart knowledge to help patients/clients voluntarily manage or modify food choices and eating behavior to maintain or improve health. Outcome: Completed/Met Date Met:  06/22/15 Nutrition Education Note  RD consulted for Renal Education. Provided "Chronic Kidney Disease 5 Nutrition Therapy" handout from [employer]. Reviewed food groups and provided written recommended serving sizes specifically determined for patient's current nutritional status.   Explained why diet restrictions are needed and provided lists of foods to limit/avoid that are high potassium, sodium, and phosphorus. Provided specific recommendations on safer alternatives of these foods. Strongly encouraged compliance of this diet.   Discussed importance of protein intake at each meal and snack. Provided examples of how to maximize protein intake throughout the day. Discussed need for fluid restriction with dialysis, importance of minimizing weight gain between HD treatments, and renal-friendly beverage options.  Encouraged pt to discuss specific diet questions/concerns with RD at HD outpatient facility. Teach back method used.  Expect fair compliance.  Body mass index is 32.18 kg/(m^2). Pt meets criteria for obese class I based on current BMI.  Current diet order is carb mod/renal, patient is consuming approximately 50% of meals at this time. Labs and medications reviewed. No further nutrition interventions warranted at this time. RD contact information provided. If additional nutrition issues arise, please re-consult RD.  Crystal Tanner. Crystal Mcglade, MS, RD LDN Inpatient Clinical Dietitian Pager [phone]

## 2015-06-22 NOTE — Consult Note (Signed)
Renal Consultation Requesting Physician:  Dr. Broadus John Reason for Consult:  CKD Stage 5  HPI: The patient is a 45 y.o. year-old AAF with a background of DM, HTN, CKD Stage 5 presumably related to DM (Has never seen a nephrologist, - was referred to our office by her primary care provider, but cancelled New Pt appts in October and again in February because she does not have insurance and could not get her [employer]).   Relocated here from [place], followed by [facility] (Dr. Adrian Blackwater). Appears that her most recent baseline creatinine has been around 5-5.5 as recently as April 2017. She was admitted on the current occasion with obstipation. Found to have a creatinine of 6.76 (6.07 post hydration) , Hb of  8.6 (8 post hydration) and we were asked to see. She is not on ACE/ARB, no NSAIDS. + FH for kidney disease (mother is approaching the need for dialysis). Has had prior issues with edema - but when she takes her furosemide 40 mg BID this is well controlled.  She reports that since her bowels have moved, her nausea has resolved, and her appetite is returning. She sleeps fine, no cognitive issues. Has lack of energy. No shortness of breath or chest pain.  Creatinine trending for reference is as follows: CREATININE, SER  Date/Time Value Ref Range Status  06/22/2015 05:22 AM 6.07* 0.44 - 1.00 mg/dL Final  06/21/2015 07:57 AM 6.65* 0.44 - 1.00 mg/dL Final  06/20/2015 10:21 PM 6.76* 0.44 - 1.00 mg/dL Final  04/29/2015 03:47 AM 5.41* 0.44 - 1.00 mg/dL Final  04/28/2015 04:43 AM 5.41* 0.44 - 1.00 mg/dL Final  04/27/2015 02:54 PM 5.00* 0.44 - 1.00 mg/dL Final  03/25/2015 01:32 PM 4.60* 0.44 - 1.00 mg/dL Final  03/25/2015 01:22 PM 4.68* 0.44 - 1.00 mg/dL Final  03/20/2015 08:32 AM 4.73* 0.44 - 1.00 mg/dL Final  02/20/2015 05:26 AM 5.12* 0.44 - 1.00 mg/dL Final  02/19/2015 01:00 PM 5.19* 0.44 - 1.00 mg/dL Final  02/19/2015 10:00 AM 5.16* 0.44 - 1.00 mg/dL Final  12/19/2014 02:17 AM 5.09* 0.44 - 1.00 mg/dL Final   12/18/2014 09:45 PM 5.43* 0.44 - 1.00 mg/dL Final  12/18/2014 03:07 PM 5.32* 0.44 - 1.00 mg/dL Final  11/11/2014 01:18 AM 4.60* 0.44 - 1.00 mg/dL Final  09/09/2014 05:40 AM 4.35* 0.44 - 1.00 mg/dL Final  09/08/2014 08:10 AM 4.51* 0.44 - 1.00 mg/dL Final     Past Medical History  Diagnosis Date  . Hypertension   . Diabetes mellitus without complication ([place]) 3845  . Gastritis   . Chronic kidney disease   . Acute kidney injury ([place])   . Anemia   . Renal cyst, left   . Constipation      Past Surgical History  Procedure Laterality Date  . Abscess drainage      Laparoscopic, abdominal     Family History  Problem Relation Age of Onset  . Hypertension Mother   . Diabetes Mother    Social History:  reports that she quit smoking about 3 months ago. She has never used smokeless tobacco. She reports that she uses illicit drugs (Marijuana). 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  Allergies: No Known Allergies  Home medications: Prior to Admission medications   Medication Sig Start Date End Date Taking? Authorizing Provider  acetaminophen (TYLENOL) 500 MG tablet Take 1,000 mg by mouth every 6 (six) hours as needed  for moderate pain or headache. Reported on 04/05/2015   Yes Historical Provider, MD  cloNIDine (CATAPRES) 0.3 MG tablet Take 1 tablet (0.3 mg total) by mouth 2 (two) times daily. 04/26/15  Yes Josalyn Funches, MD  diltiazem (CARDIZEM CD) 180 MG 24 hr capsule Take 2 capsules (360 mg total) by mouth daily. 04/26/15  Yes Josalyn Funches, MD  hydrALAZINE (APRESOLINE) 100 MG tablet Take 1 tablet (100 mg total) by mouth 3 (three) times daily. 04/26/15  Yes Josalyn Funches, MD  insulin glargine (LANTUS) 100 UNIT/ML injection Inject 0.04 mLs (4 Units total) into the skin at bedtime. Patient taking differently: Inject 12 Units into the skin at bedtime.  04/29/15  Yes  Orson Eva, MD  ondansetron (ZOFRAN) 4 MG tablet Take 1 tablet (4 mg total) by mouth every 8 (eight) hours as needed for nausea or vomiting. 04/29/15  Yes Orson Eva, MD  polyethylene glycol (MIRALAX / GLYCOLAX) packet Take 17 g by mouth daily. Patient taking differently: Take 17 g by mouth daily as needed for mild constipation or moderate constipation.  04/28/15  Yes Orson Eva, MD  senna (SENOKOT) 8.6 MG TABS tablet Take 2 tablets (17.2 mg total) by mouth daily. 04/28/15  Yes Orson Eva, MD  Blood Glucose Monitoring Suppl (TRUE METRIX METER) w/Device KIT 1 each by Does not apply route 3 (three) times daily. 01/07/15   Josalyn Funches, MD  glucose blood (FREESTYLE TEST STRIPS) test strip Use as instructed 01/07/15   Adriana Mccallum Funches, MD  glucose blood (TRUE METRIX BLOOD GLUCOSE TEST) test strip Use as instructed 01/07/15   Boykin Nearing, MD  TRUEPLUS LANCETS 28G MISC 1 each by Does not apply route 3 (three) times daily. 01/07/15   Boykin Nearing, MD    Inpatient medications: . cloNIDine  0.3 mg Oral BID  . diltiazem  360 mg Oral Daily  . heparin  5,000 Units Subcutaneous Q8H  . hydrALAZINE  100 mg Oral TID  . insulin aspart  0-9 Units Subcutaneous TID WC  . insulin aspart  3 Units Subcutaneous TID WC  . insulin glargine  10 Units Subcutaneous QHS  . polyethylene glycol  17 g Oral BID  . senna  2 tablet Oral Daily    Review of Systems Negative except as in HPI  Physical Exam:  BP 188/74 mmHg  Pulse 47  Temp(Src) 98.4 F (36.9 C) (Oral)  Resp 14  Ht 5' 2"  (1.575 m)  Wt 79.833 kg (176 lb)  BMI 32.18 kg/m2  SpO2 99%  LMP 04/19/2015  Gen: Very nice AAF, NAD. Very articulate Skin: no rash, cyanosis Neck: no JVD, no bruits  Chest: entirely clear to auscultation Heart: Regular rhythm. Normal S1S2  No S3 or S4 Abdomen: + BS. Non-distended. No focal tenderness Ext: Trace pretibial pitting edema Neuro: alert, Ox3, no focal deficit No asterixus  Labs:   Recent Labs Lab 06/20/15 2221  06/21/15 0757 06/22/15 0522 06/22/15 0931  NA 137 140 138  --   K 3.2* 3.7 3.9  --   CL 107 112* 111  --   CO2 18* 19* 18*  --   GLUCOSE 160* 54* 218*  --   BUN 80* 73* 65*  --   CREATININE 6.76* 6.65* 6.07*  --   CALCIUM 8.5* 8.5* 8.0*  --   PHOS  --   --   --  5.1*     Recent Labs Lab 06/20/15 2221  AST 9*  ALT 7*  ALKPHOS 80  BILITOT 0.6  PROT  7.2  ALBUMIN 3.9    Recent Labs Lab 06/20/15 2221  LIPASE 11     Recent Labs Lab 06/20/15 2221 06/21/15 0757 06/22/15 0522  WBC 6.1 5.8 5.8  HGB 8.7* 8.6* 8.0*  HCT 26.6* 26.5* 24.8*  MCV 80.6 82.8 83.8  PLT 215 216 189     Recent Labs Lab 06/21/15 0855 06/21/15 1119 06/21/15 1631 06/21/15 2209 06/22/15 0737  GLUCAP 114* 256* 154* 214* 332*   UA >300 protein   Xrays/Other Studies: Dg Abd 2 Views  06/20/2015  CLINICAL DATA:  45 year old female with nausea vomiting. EXAM: ABDOMEN - 2 VIEW COMPARISON:  Radiograph dated 04/27/2015 FINDINGS: Copious amount of dense stool noted throughout the colon. There is no evidence of bowel obstruction. No free air or radiopaque calculi. Mesenteric vascular calcification noted. The osseous structures appear intact. IMPRESSION: Severe Constipation.  No bowel obstruction. Electronically Signed   By: Anner Crete M.D.   On: 06/20/2015 23:29   Renal ultrasound 09/2014 11.5, 11.7 cm kidneys, increased echogenicity  Assessment/Recommendations  1. CKD stage 5 - presumably d/t DM (proteinuria, preserved kidney sizes). Ostensibly at Stage 5 since relocating here 2016. Has been unable to keep New Pt appts at our offices (scheduled twice) due to lack of her orange card. Fortunately she has no hard indications for dialysis right now (nausea difficult symptom in setting of severe obstipation but she says once bowels move,  nausea and anorexia always improve. 1. Needs to begin arrangements for dialysis - recommend vein mapping and VVS consult once vein mapping done for AVF (please have  her seen in house and get something scheduled for outpt surgery).  2. Establish PTH status (pending).  3. Check Fe stores (pending).  4. Start ESA (Ordered dose of Aranesp 200 to be given today, will give IV Fe if labs dictate.  5. Low dose sodium bicarbonate orally for metabolic acidosis 711 TID. 6. She will need to go back on her home dose of furosemide at time of discharge (40 BID)  7. I will arrange for her to have hospital followup with me Friday July 7, 12:30 PM 2. HTN - no ACE or ARB 3. DM - per primary 4. Obstipation - improved after laxatives. Says history dates all the way back to her childhood. Might benefit from GI consultation 5. Financial - Can financial person see her here to try and facilitate orange card for her?    Jamal Maes,  MD [facility] [phone] pager 06/22/2015, 11:51 AM

## 2015-06-22 NOTE — Progress Notes (Addendum)
PROGRESS NOTE    Crystal Tanner  ZOX:[id number] DOB: 1970/08/25 DOA: 06/20/2015  PCP: Lora PaulaFUNCHES, JOSALYN C, MD   Brief Narrative:  45 y/o HTN, DM 2, CKD 4, chronic constipation who presents with vomiting up to 10 episodes yesterday. Found to have severe constipation and AKI. Last admitted for the same in April. Notes from last admission state that she is resistant to taking a bowel regimen at home.   Subjective: Had 2 bowel movements last night, belly feels better, no vomiting since admission  Assessment & Plan:   Principal Problem:   Chronic Constipation  -improved with enema, miralax, senokot -2Bms last pm, continue miralax, senokot today -needs Gi FU, never had a Colonoscopy   -diet advanced  Acute renal failure on CKD 5/suspect diabetic nephropathy -baseline creatinine 5.4, admitted with creatinine of 6.6, now improved to 6.0 with hydration -appears euvolemic now, stop IVF -has never seen a Nephrologist, I have consulted Dr. Domenic Schwabonovan with renal  -Check anemia panel, PTH, phosphorus  -We mapping ordered  -No acute indications for dialysis noted however she does have some symptoms of uremia and namely anorexia and intermittent nausea for months  -Lasix on hold -Renal diet and dietitian consult requested  -Renal [place] in 9/16 with medical renal disease  H/o gastroparesis per chart No vomiting at this time, monitor   Controlled diabetes mellitus    - Insulin sliding scale- takes 12 U Lantus which was held due to hypoglycemia yesterday morning  - She is having rebound hyperglycemia now, we'll resume Lantus at 10 units daily at bedtime   HTN - Cardizem, clonidine, hydralizine   Anemia, chronic renal failure - follow-up anemia panel       Hypokalemia - treated  DVT prophylaxis: heparin Code Status: full code Family Communication: No family at bedside Disposition Plan: home when stable Consultants:   none Procedures:   none Antimicrobials:  Anti-infectives    None        Objective: Filed Vitals:   06/21/15 0349 06/21/15 1333 06/21/15 2205 06/22/15 0537  BP: 153/49 119/58 150/79 188/74  Pulse: 48 50 57 47  Temp: 98.8 F (37.1 C) 99.5 F (37.5 C) 98.5 F (36.9 C) 98.4 F (36.9 C)  TempSrc: Oral Oral Oral Oral  Resp: 18 16 16 14   Height:      Weight:      SpO2: 98% 100% 100% 99%    Intake/Output Summary (Last 24 hours) at 06/22/15 0826 Last data filed at 06/22/15 0700  Gross per 24 hour  Intake 1638.33 ml  Output    350 ml  Net 1288.33 ml   Filed Weights   06/20/15 2137  Weight: 79.833 kg (176 lb)    Examination: General exam: Appears comfortable, AAOx3, no distress HEENT: PERRLA, oral mucosa moist, no sclera icterus or thrush Respiratory system: Clear to auscultation. Respiratory effort normal. Cardiovascular system: S1 & S2 heard, RRR.  No murmurs  Gastrointestinal system: Abdomen soft,  nontender and distended. Normal bowel sound. No organomegaly Central nervous system: Alert and oriented. No focal neurological deficits. Extremities: No cyanosis, clubbing or edema Skin: No rashes or ulcers Psychiatry:  Mood & affect appropriate.     Data Reviewed: I have personally reviewed following labs and imaging studies  CBC:  Recent Labs Lab 06/20/15 2221 06/21/15 0757 06/22/15 0522  WBC 6.1 5.8 5.8  HGB 8.7* 8.6* 8.0*  HCT 26.6* 26.5* 24.8*  MCV 80.6 82.8 83.8  PLT 215 216 189   Basic Metabolic Panel:  Recent Labs Lab  06/20/15 2221 06/21/15 0757 06/22/15 0522  NA 137 140 138  K 3.2* 3.7 3.9  CL 107 112* 111  CO2 18* 19* 18*  GLUCOSE 160* 54* 218*  BUN 80* 73* 65*  CREATININE 6.76* 6.65* 6.07*  CALCIUM 8.5* 8.5* 8.0*   GFR: Estimated Creatinine Clearance: 11.5 mL/min (by C-G formula based on Cr of 6.07). Liver Function Tests:  Recent Labs Lab 06/20/15 2221  AST 9*  ALT 7*  ALKPHOS 80  BILITOT 0.6  PROT 7.2  ALBUMIN 3.9    Recent Labs Lab 06/20/15 2221  LIPASE 11   No results for input(s):  AMMONIA in the last 168 hours. Coagulation Profile: No results for input(s): INR, PROTIME in the last 168 hours. Cardiac Enzymes: No results for input(s): CKTOTAL, CKMB, CKMBINDEX, TROPONINI in the last 168 hours. BNP (last 3 results) No results for input(s): PROBNP in the last 8760 hours. HbA1C: No results for input(s): HGBA1C in the last 72 hours. CBG:  Recent Labs Lab 06/21/15 0855 06/21/15 1119 06/21/15 1631 06/21/15 2209 06/22/15 0737  GLUCAP 114* 256* 154* 214* 332*   Lipid Profile: No results for input(s): CHOL, HDL, LDLCALC, TRIG, CHOLHDL, LDLDIRECT in the last 72 hours. Thyroid Function Tests: No results for input(s): TSH, T4TOTAL, FREET4, T3FREE, THYROIDAB in the last 72 hours. Anemia Panel: No results for input(s): VITAMINB12, FOLATE, FERRITIN, TIBC, IRON, RETICCTPCT in the last 72 hours. Urine analysis:    Component Value Date/Time   COLORURINE YELLOW 06/21/2015 0200   APPEARANCEUR CLOUDY* 06/21/2015 0200   LABSPEC 1.014 06/21/2015 0200   PHURINE 5.5 06/21/2015 0200   GLUCOSEU NEGATIVE 06/21/2015 0200   HGBUR NEGATIVE 06/21/2015 0200   BILIRUBINUR NEGATIVE 06/21/2015 0200   KETONESUR NEGATIVE 06/21/2015 0200   PROTEINUR >300* 06/21/2015 0200   UROBILINOGEN 0.2 09/08/2014 1400   NITRITE NEGATIVE 06/21/2015 0200   LEUKOCYTESUR NEGATIVE 06/21/2015 0200   Sepsis Labs: (procalcitonin:4,lacticidven:4) )No results found for this or any previous visit (from the past 240 hour(s)).       Radiology Studies: Dg Abd 2 Views  06/20/2015  CLINICAL DATA:  45 year old female with nausea vomiting. EXAM: ABDOMEN - 2 VIEW COMPARISON:  Radiograph dated 04/27/2015 FINDINGS: Copious amount of dense stool noted throughout the colon. There is no evidence of bowel obstruction. No free air or radiopaque calculi. Mesenteric vascular calcification noted. The osseous structures appear intact. IMPRESSION: Severe Constipation.  No bowel obstruction. Electronically Signed    By: Elgie Collard M.D.   On: 06/20/2015 23:29      Scheduled Meds: . cloNIDine  0.3 mg Oral BID  . diltiazem  360 mg Oral Daily  . heparin  5,000 Units Subcutaneous Q8H  . hydrALAZINE  100 mg Oral TID  . insulin aspart  0-9 Units Subcutaneous TID WC  . insulin aspart  3 Units Subcutaneous TID WC  . insulin glargine  10 Units Subcutaneous QHS  . polyethylene glycol  17 g Oral BID  . senna  2 tablet Oral Daily   Continuous Infusions: . sodium chloride 100 mL/hr at 06/21/15 0328        Time spent :    Venetta Knee, MD [facility] [phone] [email or web address] Password [facility] 06/22/2015, 8:26 AM

## 2015-06-23 ENCOUNTER — Observation Stay (HOSPITAL_BASED_OUTPATIENT_CLINIC_OR_DEPARTMENT_OTHER): Payer: Medicaid Other

## 2015-06-23 DIAGNOSIS — N185 Chronic kidney disease, stage 5: Secondary | ICD-10-CM

## 2015-06-23 DIAGNOSIS — N184 Chronic kidney disease, stage 4 (severe): Secondary | ICD-10-CM

## 2015-06-23 LAB — RENAL FUNCTION PANEL
Albumin: 3.2 g/dL — ABNORMAL LOW (ref 3.5–5.0)
Anion gap: 9 (ref 5–15)
BUN: 63 mg/dL — ABNORMAL HIGH (ref 6–20)
CHLORIDE: 111 mmol/L (ref 101–111)
CO2: 18 mmol/L — AB (ref 22–32)
CREATININE: 6.12 mg/dL — AB (ref 0.44–1.00)
Calcium: 8.4 mg/dL — ABNORMAL LOW (ref 8.9–10.3)
GFR, EST AFRICAN AMERICAN: 9 mL/min — AB (ref >60)
GFR, EST NON AFRICAN AMERICAN: 7 mL/min — AB (ref >60)
Glucose, Bld: 218 mg/dL — ABNORMAL HIGH (ref 65–99)
POTASSIUM: 3.9 mmol/L (ref 3.5–5.1)
Phosphorus: 4.7 mg/dL — ABNORMAL HIGH (ref 2.5–4.6)
Sodium: 138 mmol/L (ref 135–145)

## 2015-06-23 LAB — CBC
HCT: 24.6 % — ABNORMAL LOW (ref 36.0–46.0)
HEMOGLOBIN: 7.9 g/dL — AB (ref 12.0–15.0)
MCH: 27 pg (ref 26.0–34.0)
MCHC: 32.1 g/dL (ref 30.0–36.0)
MCV: 84 fL (ref 78.0–100.0)
Platelets: 178 10*3/uL (ref 150–400)
RBC: 2.93 MIL/uL — ABNORMAL LOW (ref 3.87–5.11)
RDW: 15.8 % — ABNORMAL HIGH (ref 11.5–15.5)
WBC: 6.3 10*3/uL (ref 4.0–10.5)

## 2015-06-23 LAB — GLUCOSE, CAPILLARY
GLUCOSE-CAPILLARY: 164 mg/dL — AB (ref 65–99)
GLUCOSE-CAPILLARY: 206 mg/dL — AB (ref 65–99)
Glucose-Capillary: 238 mg/dL — ABNORMAL HIGH (ref 65–99)
Glucose-Capillary: 154 mg/dL — ABNORMAL HIGH (ref 65–99)

## 2015-06-23 MED ORDER — SODIUM CHLORIDE 0.9 % IV SOLN
510.0000 mg | Freq: Once | INTRAVENOUS | Status: AC
  Administered 2015-06-23: 510 mg via INTRAVENOUS
  Filled 2015-06-23: qty 17

## 2015-06-23 NOTE — Progress Notes (Signed)
PROGRESS NOTE    Crystal Tanner  [id number] DOB: October 16, 1970 DOA: 06/20/2015  PCP: Lora PaulaFUNCHES, JOSALYN C, MD   Brief Narrative:  45 y/o HTN, DM 2, CKD 4, chronic constipation who presents with vomiting up to 10 episodes yesterday. Found to have severe constipation and AKI. Last admitted for the same in April. Notes from last admission state that she is resistant to taking a bowel regimen at home.   Subjective: Asking about a renal transplant-- plans to get a kidney from a family member  Assessment & Plan:     Chronic Constipation  -improved with enema, miralax, senokot -2Bms last pm, continue miralax, senokot today -needs Gi FU, never had a Colonoscopy   -diet advanced -resistant to bowel regimen at home  Acute renal failure on CKD 5/suspect diabetic nephropathy -baseline creatinine 5.4, admitted with creatinine of 6.6, now improved to 6.0 with hydration -appears euvolemic now, stop IVF -Dr.  Eliott Nineunham- need vein mapping and then vascular follow up- vein mapping ordered -No acute indications for dialysis noted however she does have some symptoms of uremia and namely anorexia and intermittent nausea for months  -Lasix on hold -Renal diet and dietitian consult requested  -Renal [place] in 9/16 with medical renal disease -poor insight into renal disease-- asking for renal transplant  H/o gastroparesis per chart No vomiting at this time, monitor   Controlled diabetes mellitus    - Insulin sliding scale- takes 12 U Lantus which was held due to hypoglycemia yesterday morning  - resume Lantus at 10 units daily at bedtime   HTN - Cardizem, clonidine, hydralizine   Anemia, chronic renal failure - follow-up anemia panel       Hypokalemia - treated  DVT prophylaxis: heparin Code Status: full code Family Communication: No family at bedside Disposition Plan: home when stable- in AM? Consultants:   renal Procedures:   Vein mapping ordered  Antimicrobials:  Anti-infectives    None       Objective: Filed Vitals:   06/22/15 0537 06/22/15 1415 06/22/15 2302 06/23/15 0532  BP: 188/74 130/68 174/74 138/74  Pulse: 47 48 48 49  Temp: 98.4 F (36.9 C) 98.2 F (36.8 C) 98.8 F (37.1 C) 98.7 F (37.1 C)  TempSrc: Oral Oral Oral Oral  Resp: 14 16 18 17   Height:      Weight:      SpO2: 99% 100% 100% 100%    Intake/Output Summary (Last 24 hours) at 06/23/15 1001 Last data filed at 06/23/15 0532  Gross per 24 hour  Intake    600 ml  Output    800 ml  Net   -200 ml   Filed Weights   06/20/15 2137  Weight: 79.833 kg (176 lb)    Examination: General exam: Appears comfortable, AAOx3, no distress Respiratory system: Clear to auscultation. Respiratory effort normal. Cardiovascular system: S1 & S2 heard, RRR.  No murmurs  Gastrointestinal system: Abdomen soft,  nontender and mildly distended. Normal bowel sound. No organomegaly Central nervous system: Alert and oriented. No focal neurological deficits.     Data Reviewed: I have personally reviewed following labs and imaging studies  CBC:  Recent Labs Lab 06/20/15 2221 06/21/15 0757 06/22/15 0522 06/23/15 0439  WBC 6.1 5.8 5.8 6.3  HGB 8.7* 8.6* 8.0* 7.9*  HCT 26.6* 26.5* 24.8* 24.6*  MCV 80.6 82.8 83.8 84.0  PLT 215 216 189 178   Basic Metabolic Panel:  Recent Labs Lab 06/20/15 2221 06/21/15 0757 06/22/15 0522 06/22/15 0931 06/23/15 0439  NA  137 140 138  --  138  K 3.2* 3.7 3.9  --  3.9  CL 107 112* 111  --  111  CO2 18* 19* 18*  --  18*  GLUCOSE 160* 54* 218*  --  218*  BUN 80* 73* 65*  --  63*  CREATININE 6.76* 6.65* 6.07*  --  6.12*  CALCIUM 8.5* 8.5* 8.0*  --  8.4*  PHOS  --   --   --  5.1* 4.7*   GFR: Estimated Creatinine Clearance: 11.4 mL/min (by C-G formula based on Cr of 6.12). Liver Function Tests:  Recent Labs Lab 06/20/15 2221 06/23/15 0439  AST 9*  --   ALT 7*  --   ALKPHOS 80  --   BILITOT 0.6  --   PROT 7.2  --   ALBUMIN 3.9 3.2*    Recent Labs Lab  06/20/15 2221  LIPASE 11   No results for input(s): AMMONIA in the last 168 hours. Coagulation Profile: No results for input(s): INR, PROTIME in the last 168 hours. Cardiac Enzymes: No results for input(s): CKTOTAL, CKMB, CKMBINDEX, TROPONINI in the last 168 hours. BNP (last 3 results) No results for input(s): PROBNP in the last 8760 hours. HbA1C: No results for input(s): HGBA1C in the last 72 hours. CBG:  Recent Labs Lab 06/22/15 0737 06/22/15 1210 06/22/15 1646 06/22/15 2104 06/23/15 0733  GLUCAP 332* 210* 178* 164* 238*   Lipid Profile: No results for input(s): CHOL, HDL, LDLCALC, TRIG, CHOLHDL, LDLDIRECT in the last 72 hours. Thyroid Function Tests: No results for input(s): TSH, T4TOTAL, FREET4, T3FREE, THYROIDAB in the last 72 hours. Anemia Panel:  Recent Labs  06/22/15 0931  VITAMINB12 512  FOLATE 7.9  FERRITIN 10*  TIBC 302  IRON 44  RETICCTPCT 1.2   Urine analysis:    Component Value Date/Time   COLORURINE YELLOW 06/22/2015 0800   APPEARANCEUR CLOUDY* 06/22/2015 0800   LABSPEC 1.014 06/22/2015 0800   PHURINE 5.5 06/22/2015 0800   GLUCOSEU 500* 06/22/2015 0800   HGBUR NEGATIVE 06/22/2015 0800   BILIRUBINUR NEGATIVE 06/22/2015 0800   KETONESUR NEGATIVE 06/22/2015 0800   PROTEINUR >300* 06/22/2015 0800   UROBILINOGEN 0.2 09/08/2014 1400   NITRITE NEGATIVE 06/22/2015 0800   LEUKOCYTESUR NEGATIVE 06/22/2015 0800    )No results found for this or any previous visit (from the past 240 hour(s)).       Radiology Studies: No results found.    Scheduled Meds: . cloNIDine  0.3 mg Oral BID  . diltiazem  360 mg Oral Daily  . heparin  5,000 Units Subcutaneous Q8H  . hydrALAZINE  100 mg Oral TID  . insulin aspart  0-9 Units Subcutaneous TID WC  . insulin aspart  3 Units Subcutaneous TID WC  . insulin glargine  10 Units Subcutaneous QHS  . multivitamin  1 tablet Oral QHS  . polyethylene glycol  17 g Oral BID  . senna  2 tablet Oral Daily  . sodium  bicarbonate  650 mg Oral TID   Continuous Infusions:        Time spent : 25min    Anessia Oakland Juanetta GoslingU Nohelia Valenza, DO [facility] [phone] [email or web address] Password [email or web address] 06/23/2015, 10:01 AM

## 2015-06-23 NOTE — Consult Note (Signed)
Consult Note  Patient name: Crystal Tanner MRN: [id number] DOB: December 01, 1970 Sex: female  Consulting Physician:  Dr. Lorrene Reid  Reason for Consult:  Chief Complaint  Patient presents with  . Nausea  . Emesis    HISTORY OF PRESENT ILLNESS: 45 year old right handed female with history of hypertension and diabetes who just moved from [place] 6 months ago.  She has developed worsening renal function, acute on chronic, and it in need of permanent dialysis access.  She has not started renal replacement therapy.  She was admitted with vomiting and constipation.  She has been a diabetic since age 71.  It is not well controlled  Past Medical History  Diagnosis Date  . Hypertension   . Diabetes mellitus without complication ([place]) 5035  . Gastritis   . Chronic kidney disease   . Acute kidney injury ([place])   . Anemia   . Renal cyst, left   . Constipation     Past Surgical History  Procedure Laterality Date  . Abscess drainage      Laparoscopic, abdominal    Social History   Social History  . Marital Status: Single    Spouse Name: N/A  . Number of Children: N/A  . Years of Education: N/A   Occupational History  . Not on file.   Social History Main Topics  . Smoking status: Former Smoker    Quit date: 02/26/2015  . Smokeless tobacco: Never Used  . Alcohol Use: No     Comment: less than a ppd, "not much"  . Drug Use: Yes    Special: Marijuana     Comment: Last used 04/17/2015  . Sexual Activity: No   Other Topics Concern  . Not on file   Social History Narrative    Family History  Problem Relation Age of Onset  . Hypertension Mother   . Diabetes Mother     Allergies as of 06/20/2015  . (No Known Allergies)    No current facility-administered medications on file prior to encounter.   Current Outpatient Prescriptions on File Prior to Encounter  Medication Sig Dispense Refill  . acetaminophen (TYLENOL) 500 MG tablet Take 1,000 mg by mouth every 6 (six)  hours as needed for moderate pain or headache. Reported on 04/05/2015    . cloNIDine (CATAPRES) 0.3 MG tablet Take 1 tablet (0.3 mg total) by mouth 2 (two) times daily. 60 tablet 5  . diltiazem (CARDIZEM CD) 180 MG 24 hr capsule Take 2 capsules (360 mg total) by mouth daily. 60 capsule 5  . hydrALAZINE (APRESOLINE) 100 MG tablet Take 1 tablet (100 mg total) by mouth 3 (three) times daily. 90 tablet 5  . insulin glargine (LANTUS) 100 UNIT/ML injection Inject 0.04 mLs (4 Units total) into the skin at bedtime. (Patient taking differently: Inject 12 Units into the skin at bedtime. ) 10 mL 0  . ondansetron (ZOFRAN) 4 MG tablet Take 1 tablet (4 mg total) by mouth every 8 (eight) hours as needed for nausea or vomiting. 20 tablet 0  . polyethylene glycol (MIRALAX / GLYCOLAX) packet Take 17 g by mouth daily. (Patient taking differently: Take 17 g by mouth daily as needed for mild constipation or moderate constipation. ) 30 each 2  . senna (SENOKOT) 8.6 MG TABS tablet Take 2 tablets (17.2 mg total) by mouth daily. 120 each 0  . Blood Glucose Monitoring Suppl (TRUE METRIX METER) w/Device KIT 1 each by Does not apply route  3 (three) times daily. 1 kit 0  . glucose blood (FREESTYLE TEST STRIPS) test strip Use as instructed 100 each 12  . glucose blood (TRUE METRIX BLOOD GLUCOSE TEST) test strip Use as instructed 100 each 12  . TRUEPLUS LANCETS 28G MISC 1 each by Does not apply route 3 (three) times daily. 100 each 11     REVIEW OF SYSTEMS: Cardiovascular: No chest pain, chest pressure, palpitations, orthopnea, or dyspnea on exertion. No claudication or rest pain,  No history of DVT or phlebitis. Pulmonary: No productive cough, asthma or wheezing. Neurologic: No weakness, paresthesias, aphasia, or amaurosis. No dizziness. Hematologic: No bleeding problems or clotting disorders. Musculoskeletal: No joint pain or joint swelling. Gastrointestinal: vomiting and constipation Genitourinary: No dysuria or  hematuria. Psychiatric:: No history of major depression. Integumentary: No rashes or ulcers. Constitutional: No fever or chills.  PHYSICAL EXAMINATION: General: The patient appears their stated age.  Vital signs are BP 163/75 mmHg  Pulse 69  Temp(Src) 99.3 F (37.4 C) (Oral)  Resp 18  Ht 5' 2"  (1.575 m)  Wt 176 lb (79.833 kg)  BMI 32.18 kg/m2  SpO2 100%  LMP 04/19/2015 Pulmonary: Respirations are non-labored HEENT:  No gross abnormalities Abdomen: Soft and non-tender  Musculoskeletal: There are no major deformities.   Neurologic: No focal weakness or paresthesias are detected, Skin: There are no ulcer or rashes noted. Psychiatric: The patient has normal affect. Cardiovascular: There is a regular rate and rhythm without significant murmur appreciated.  Palpable left radial and brachial pulse  Diagnostic Studies: I have reviewed her vein mapping with the following results: Right Upper Extremity Vein Map    Cephalic  Segment Diameter Depth Comment  1. Axilla mm mm Unable to follow  2. Mid upper arm mm mm Unable to follow  3. Above AC 1.64m 3.550m  4. In AC 2.48m45m.0mm70m5. Below AC 2.3mm 48mmm  40m Mid forearm 2.5mm 4.31m   731mrist 3.0mm 3.8.0SU 1.1SRlic  Segment Diameter Depth Comment  2. Mid upper arm 3.8mm 11.1948mEnter548mrachial  3. Above AC 3.8mm 7.0mm 148m. In5m 3.8mm 6.4mm   53mBelo35mC 3.5mm 3.5mm   6.33md fo5mrm 3.0mm 3.1mm   7. W65mt 2.5m 1.78mm    VASCU28m LAB378mELIMINARY PRELIMINARY PRELIMINARY PRELIMINARY  Left Upper Extremity Vein Map    Cephalic  Segment Diameter Depth Comment  1. Axilla 1.6mm 2.8mm   2. Mid u58mr ar748m.78mm 3.78mm   3. Above A64m.78mm69m0mm   4. In AC 3.159m2.78m30m 5. Below AC 2.1m 4.126m  6. Mid forearm 234mm 2.86m   7. Wrist 3.48mm 2.958mThro248msed at the IV s35m onl63m Basilic  Segment Diameter Depth Comment   2. Mid upper arm 3.4 mm 10.3mm   3. Above AC 3.4mm 10.1mm   4578mn AC 3.8mm 3.4mm  65m Belo60mC 4.3mm 4.4mm  248m Mid85mrearm 3.48mm 4.48mm 48m. Wr978m mm mm Unable to visu1mze d10mto possibly the size or that it runs extremely close to the bone    All areas of the veins imaged were patent and compressible except for the short segment of the left basilic at the wrist        Assessment:  CKD stage V Plan: In this right handed female, I discussed proceeding with a left basilic vein trasnposition.  We discussed doing this in 2 stages.  The risks and benefits  were discussed, including but not limited to non-maturity, steal syndrome, and the need for future proceedures.  All of her questions were answered.  She is scheduled for Monday June 26 with Dr. Donnetta Hutching.  If she is discharged prior to her surgery, she will need to be at [facility] short stay at 630 Monday morning.  She needs to be reminded to be NPO after midnight on Sunday     V. Leia Alf, M.D. [facility] Office: [phone] Pager:  [phone]

## 2015-06-23 NOTE — Progress Notes (Signed)
VASCULAR LAB PRELIMINARY  PRELIMINARY  PRELIMINARY  PRELIMINARY  Right  Upper Extremity Vein Map    Cephalic  Segment Diameter Depth Comment  1. Axilla mm mm Unable to follow  2. Mid upper arm mm mm Unable to follow  3. Above AC 1.288mm 3.25mm   4. In AC 2.812mm 2.240mm   5. Below AC 2.253mm 6.340mm   6. Mid forearm 2.665mm 4.693mm   7. Wrist 3.400mm 3.632mm    Basilic  Segment Diameter Depth Comment  2. Mid upper arm 3.458mm 11.521mm Enters brachial  3. Above AC 3.888mm 7.490mm   4. In AC 3.968mm 6.464mm   5. Below AC 3.475mm 3.735mm   6. Mid forearm 3.570mm 3.611mm   7. Wrist 2.422mm 1.937mm    VASCULAR LAB PRELIMINARY  PRELIMINARY  PRELIMINARY  PRELIMINARY  Left Upper Extremity Vein Map    Cephalic  Segment Diameter Depth Comment  1. Axilla 1.486mm 2.178mm   2. Mid upper arm 2.727mm 3.767mm   3. Above AC 2.587mm 2.640mm   4. In AC 3.581mm 2.287mm   5. Below AC 2.467mm 4.701mm   6. Mid forearm 2.275mm 2.528mm   7. Wrist 3.182mm 2.459mm Thrombosed at the IV site only   Basilic  Segment Diameter Depth Comment  2. Mid upper arm 3.4 mm 10.613mm   3. Above AC 3.64mm 10.371mm   4. In AC 3.128mm 3.804mm   5. Below AC 4.543mm 4.374mm   6. Mid forearm 3.602mm 4.452mm   7. Wrist mm mm Unable to visualize due to possibly the size or that it runs extremely close to the bone    All areas of the veins imaged were patent and compressible except for the short segment of the left basilic at the wrist    Montee Tallman, RVS 06/23/2015, 11:33 AM

## 2015-06-23 NOTE — Hospital Discharge Follow-Up (Signed)
Transitional [facility] Clinic at [place]:  Patient known to [facility]-PCP: Dr. Adrian Blackwater. Agreeable to follow-up with the Transitional Care Clinic after discharge. This Case Manager met with patient once again at bedside, and provided patient with information about missing document needed for [facility] application. Once patient has gathered needed documentation; patient will need appointment with Development worker, community at [facility] and [facility]. Patient appreciative of information. Reminded patient of scheduled Transitional Care Clinic appointment on 06/28/15 at 1045. Appointment on AVS. Patient verbalized understanding. Will continue to follow patient's clinical progress closely.

## 2015-06-23 NOTE — Progress Notes (Signed)
[facility] Rounding Note  Subjective:  No anorexia or nausea No SOB Asking about transplant - explained process. Not even eligible for referral until she gets Medicare which would not kick in until 3 mos after starting dialysis.   Objective Vital signs in last 24 hours: Filed Vitals:   06/22/15 1415 06/22/15 2302 06/23/15 0532 06/23/15 1108  BP: 130/68 174/74 138/74 172/81  Pulse: 48 48 49 49  Temp: 98.2 F (36.8 C) 98.8 F (37.1 C) 98.7 F (37.1 C)   TempSrc: Oral Oral Oral   Resp: 16 18 17    Height:      Weight:      SpO2: 100% 100% 100%    Weight change:   Intake/Output Summary (Last 24 hours) at 06/23/15 1155 Last data filed at 06/23/15 0532  Gross per 24 hour  Intake    600 ml  Output    800 ml  Net   -200 ml    Physical Exam:  BP 172/81 mmHg  Pulse 49  Temp(Src) 98.7 F (37.1 C) (Oral)  Resp 17  Ht 5\' 2"  (1.575 m)  Wt 79.833 kg (176 lb)  BMI 32.18 kg/m2  SpO2 100%  LMP 04/19/2015 Gen: Very nice AAF, NAD. Very articulate Skin: no rash, cyanosis Neck: no JVD, no bruits  Chest: entirely clear to auscultation Heart: Regular rhythm. Normal S1S2 No S3 or S4 Abdomen: + BS. Non-distended. No focal tenderness Ext: Trace pretibial pitting edema Neuro: alert, Ox3, no focal deficit No asterixus  Labs:   Recent Labs Lab 06/20/15 2221 06/21/15 0757 06/22/15 0522 06/22/15 0931 06/23/15 0439  NA 137 140 138  --  138  K 3.2* 3.7 3.9  --  3.9  CL 107 112* 111  --  111  CO2 18* 19* 18*  --  18*  GLUCOSE 160* 54* 218*  --  218*  BUN 80* 73* 65*  --  63*  CREATININE 6.76* 6.65* 6.07*  --  6.12*  CALCIUM 8.5* 8.5* 8.0*  --  8.4*  PHOS  --   --   --  5.1* 4.7*     Recent Labs Lab 06/20/15 2221 06/23/15 0439  AST 9*  --   ALT 7*  --   ALKPHOS 80  --   BILITOT 0.6  --   PROT 7.2  --   ALBUMIN 3.9 3.2*    Recent Labs Lab 06/20/15 2221  LIPASE 11     Recent Labs Lab 06/20/15 2221 06/21/15 0757 06/22/15 0522 06/23/15 0439  WBC 6.1 5.8 5.8 6.3   HGB 8.7* 8.6* 8.0* 7.9*  HCT 26.6* 26.5* 24.8* 24.6*  MCV 80.6 82.8 83.8 84.0  PLT 215 216 189 178     Recent Labs Lab 06/22/15 0737 06/22/15 1210 06/22/15 1646 06/22/15 2104 06/23/15 0733  GLUCAP 332* 210* 178* 164* 238*    :  Recent Labs Lab 06/22/15 0931  IRON 44  TIBC 302  FERRITIN 10*  TSat 10%  Medications:   . cloNIDine  0.3 mg Oral BID  . diltiazem  360 mg Oral Daily  . heparin  5,000 Units Subcutaneous Q8H  . hydrALAZINE  100 mg Oral TID  . insulin aspart  0-9 Units Subcutaneous TID WC  . insulin aspart  3 Units Subcutaneous TID WC  . insulin glargine  10 Units Subcutaneous QHS  . multivitamin  1 tablet Oral QHS  . polyethylene glycol  17 g Oral BID  . senna  2 tablet Oral Daily  . sodium bicarbonate  650 mg Oral TID    Renal ultrasound 09/2014 11.5, 11.7 cm kidneys, increased echogenicity  Assessment/Recommendations  1. CKD stage 5 - presumably d/t DM (proteinuria, preserved kidney sizes). Ostensibly at Stage 5 since relocating here 2016. Has been unable to keep New Pt appts at our offices (scheduled twice) due to lack of her orange card. Fortunately she has no hard indications for dialysis right now (nausea difficult symptom in setting of severe obstipation but she says once bowels move, nausea and anorexia always improve. 1. Needs to begin arrangements for dialysis - Vein mapping done and I have called [facility] consult  for AVF  2. Establish PTH status (pending).  3. I have arranged for her to have hospital followup with me Friday July 7, 12:30 PM 4. Cannot refer for transplant at this time - she has to have Medicare in place before that process can start. Explained to her. 2. HTN - no ACE or ARB. She will need to go back on her home dose of furosemide at time of discharge (40 BID)  3. Anemia of CKD5 - TSat low - will dose with Feraheme 510 IV. Was dosed with Aranesp 200 yesterday 6/21 4. Metabolic acidosis - Low dose sodium bicarbonate orally for  metabolic acidosis 650 TID started 5. DM - per primary 6. Obstipation - improved after laxatives. Says history dates all the way back to her childhood. Might benefit from GI consultation    Crystal Balynthia Evette Diclemente, MD [facility] [phone] pager 06/23/2015, 11:55 AM

## 2015-06-24 ENCOUNTER — Encounter (HOSPITAL_COMMUNITY): Payer: Self-pay | Admitting: *Deleted

## 2015-06-24 DIAGNOSIS — E872 Acidosis: Secondary | ICD-10-CM

## 2015-06-24 DIAGNOSIS — D631 Anemia in chronic kidney disease: Secondary | ICD-10-CM

## 2015-06-24 DIAGNOSIS — N185 Chronic kidney disease, stage 5: Secondary | ICD-10-CM

## 2015-06-24 DIAGNOSIS — K5909 Other constipation: Secondary | ICD-10-CM

## 2015-06-24 LAB — RENAL FUNCTION PANEL
ALBUMIN: 3.9 g/dL (ref 3.5–5.0)
ANION GAP: 11 (ref 5–15)
BUN: 60 mg/dL — AB (ref 6–20)
CHLORIDE: 110 mmol/L (ref 101–111)
CO2: 18 mmol/L — ABNORMAL LOW (ref 22–32)
Calcium: 9.2 mg/dL (ref 8.9–10.3)
Creatinine, Ser: 6.06 mg/dL — ABNORMAL HIGH (ref 0.44–1.00)
GFR calc Af Amer: 9 mL/min — ABNORMAL LOW (ref >60)
GFR, EST NON AFRICAN AMERICAN: 8 mL/min — AB (ref >60)
Glucose, Bld: 87 mg/dL (ref 65–99)
PHOSPHORUS: 4.6 mg/dL (ref 2.5–4.6)
POTASSIUM: 4 mmol/L (ref 3.5–5.1)
Sodium: 139 mmol/L (ref 135–145)

## 2015-06-24 LAB — CBC
HEMATOCRIT: 28.7 % — AB (ref 36.0–46.0)
HEMOGLOBIN: 9.3 g/dL — AB (ref 12.0–15.0)
MCH: 27.1 pg (ref 26.0–34.0)
MCHC: 32.4 g/dL (ref 30.0–36.0)
MCV: 83.7 fL (ref 78.0–100.0)
Platelets: 249 10*3/uL (ref 150–400)
RBC: 3.43 MIL/uL — ABNORMAL LOW (ref 3.87–5.11)
RDW: 15.9 % — AB (ref 11.5–15.5)
WBC: 6.5 10*3/uL (ref 4.0–10.5)

## 2015-06-24 LAB — GLUCOSE, CAPILLARY
GLUCOSE-CAPILLARY: 89 mg/dL (ref 65–99)
GLUCOSE-CAPILLARY: 186 mg/dL — AB (ref 65–99)

## 2015-06-24 MED ORDER — FUROSEMIDE 40 MG PO TABS: 40.0000 mg | ORAL_TABLET | Freq: Two times a day (BID) | ORAL | Status: DC

## 2015-06-24 MED ORDER — SODIUM BICARBONATE 650 MG PO TABS
650.0000 mg | ORAL_TABLET | Freq: Three times a day (TID) | ORAL | Status: DC
Start: 2015-06-24 — End: 2015-08-15

## 2015-06-24 MED ORDER — RENA-VITE PO TABS: 1.0000 | ORAL_TABLET | Freq: Every day | ORAL | Status: AC

## 2015-06-24 MED FILL — SODIUM BICARB 650 MG TABLET: 650 | 30 days supply | Qty: 90 | Fill #0

## 2015-06-24 NOTE — Progress Notes (Signed)
Ms Crystal MaplesGLenn has Type II Diabetes, states that CBG is not < 70 in am.  I instructed patient to take 1/2 of pm Lantus  On Sunday evening- 6 units.  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  I instructed patient to check CBG to check CBG and if it is less than 70 to treat it with  or 1/2 cup of clear juice like apple juice or cranberry juice. I instructed patient to recheck CBG in 15 minutes and if CBG is not greater than 70, to  Call [phone] (pre- op). If it is before pre-op opens to retreat as before and recheck CBG in 15 minutes. I told patient to make note of time that liquid is taken and amount, that surgical time may have to be adjusted.

## 2015-06-24 NOTE — Progress Notes (Signed)
[facility] Rounding Note  Subjective:  No anorexia or nausea No SOB Dr. Myra GianottiBrabham has seen and scheduled for AVF on Monday June 26  Objective Vital signs in last 24 hours: Filed Vitals:   06/23/15 1108 06/23/15 1458 06/23/15 2147 06/24/15 0517  BP: 172/81 163/75 176/63 149/73  Pulse: 49 69 60 62  Temp:  99.3 F (37.4 C) 98.8 F (37.1 C) 98.4 F (36.9 C)  TempSrc:  Oral Oral Oral  Resp:  18 18 17   Height:      Weight:      SpO2:  100% 100% 100%   Weight change:   Intake/Output Summary (Last 24 hours) at 06/24/15 1240 Last data filed at 06/24/15 0517  Gross per 24 hour  Intake    480 ml  Output    600 ml  Net   -120 ml    Physical Exam:  BP 149/73 mmHg  Pulse 62  Temp(Src) 98.4 F (36.9 C) (Oral)  Resp 17  Ht 5\' 2"  (1.575 m)  Wt 79.833 kg (176 lb)  BMI 32.18 kg/m2  SpO2 100%  LMP 04/19/2015 Gen: Very nice AAF, NAD.  Neck: no JVD Chest: entirely clear to auscultation Heart: Regular rhythm. Normal S1S2 No S3 or S4 Abdomen: + BS. Non-distended. No focal tenderness Ext: Trace pretibial pitting edema Alert, oriented, appropriate No asterixus  Labs:   Recent Labs Lab 06/20/15 2221 06/21/15 0757 06/22/15 0522 06/22/15 0931 06/23/15 0439 06/24/15 0517  NA 137 140 138  --  138 139  K 3.2* 3.7 3.9  --  3.9 4.0  CL 107 112* 111  --  111 110  CO2 18* 19* 18*  --  18* 18*  GLUCOSE 160* 54* 218*  --  218* 87  BUN 80* 73* 65*  --  63* 60*  CREATININE 6.76* 6.65* 6.07*  --  6.12* 6.06*  CALCIUM 8.5* 8.5* 8.0*  --  8.4* 9.2  PHOS  --   --   --  5.1* 4.7* 4.6     Recent Labs Lab 06/20/15 2221 06/23/15 0439 06/24/15 0517  AST 9*  --   --   ALT 7*  --   --   ALKPHOS 80  --   --   BILITOT 0.6  --   --   PROT 7.2  --   --   ALBUMIN 3.9 3.2* 3.9    Recent Labs Lab 06/20/15 2221  LIPASE 11     Recent Labs Lab 06/21/15 0757 06/22/15 0522 06/23/15 0439 06/24/15 0517  WBC 5.8 5.8 6.3 6.5  HGB 8.6* 8.0* 7.9* 9.3*  HCT 26.5* 24.8* 24.6* 28.7*  MCV  82.8 83.8 84.0 83.7  PLT 216 189 178 249     Recent Labs Lab 06/23/15 1142 06/23/15 1632 06/23/15 2144 06/24/15 0724 06/24/15 1137  GLUCAP 164* 206* 154* 89 186*    :   Recent Labs Lab 06/22/15 0931  IRON 44  TIBC 302  FERRITIN 10*  TSat 10%  Medications:   . cloNIDine  0.3 mg Oral BID  . diltiazem  360 mg Oral Daily  . heparin  5,000 Units Subcutaneous Q8H  . hydrALAZINE  100 mg Oral TID  . insulin aspart  0-9 Units Subcutaneous TID WC  . insulin aspart  3 Units Subcutaneous TID WC  . insulin glargine  10 Units Subcutaneous QHS  . multivitamin  1 tablet Oral QHS  . polyethylene glycol  17 g Oral BID  . senna  2 tablet Oral Daily  .  sodium bicarbonate  650 mg Oral TID    Renal ultrasound 09/2014 11.5, 11.7 cm kidneys, increased echogenicity  Assessment/Recommendations  1. CKD stage 5 - presumably d/t DM (proteinuria, preserved kidney sizes). Ostensibly at Stage 5 since relocating here 2016. Has been unable to keep New Pt appts at our offices (scheduled twice) due to lack of her orange card. Fortunately she has no hard indications for dialysis right now (nausea difficult symptom in setting of severe obstipation but she says once bowels move, nausea and anorexia always improve. Stable renal function, K all right.  1. Vein mapping done and AVF scheduled for Monday 6/26  2. Establish PTH status (STILL pending from 6/21!).  3. I have arranged for her to have hospital followup with me Friday July 7, 12:30 PM 4. Cannot refer for transplant at this time - she has to have Medicare in place before that process can start. Explained to her. 2. HTN - no ACE or ARB. She will need to go back on her home dose of furosemide at time of discharge (40 BID)  3. Anemia of CKD5 - TSat low - Dosed with Feraheme 510 IVon 6/22 and with Aranesp 200 mcg 6/21 4. Metabolic acidosis - Low dose sodium bicarbonate orally for metabolic acidosis 650 TID started 5. DM - per primary 6. Obstipation  - improved after laxatives. Says history dates all the way back to her childhood. Might benefit from GI consultation  Disposition - from my standpoint OK to go home. [facility]30 AM on Monday, NPO after midnight on Sunday. Friday July 7 followup with me already scheduled in the office.   Camille Balynthia Elner Seifert, MD [facility] [phone] pager 06/24/2015, 12:40 PM

## 2015-06-24 NOTE — Discharge Summary (Signed)
Physician Discharge Summary  Patient ID: Crystal Tanner MRN: [id number] DOB/AGE: [birth year] 45 y.o.  Admit date: 06/20/2015 Discharge date: 06/24/2015  Admission Diagnoses:  Discharge Diagnoses:  Principal Problem:   Constipation Active Problems:   Anemia, chronic renal failure   Acute renal failure (ARF) (HCC)   Nausea with vomiting   Hypokalemia   Diabetes mellitus type 2, uncontrolled, with complications (HCC)   Discharged Condition: stable  Hospital Course: 45 y.o. female with medical history significant of HTN, diabetes mellitus type 2, chronic kidney disease stage III (now progressed to Stage V), anemia, and chronic constipation; who presents with complaints of nausea and vomiting, and constipation. Denies any abdominal pain, diarrhea, fever, chest pain, shortness of breath. Patient was admitted for further assessment and management. Nausea and vomiting were managed supportively and have resolved significantly. Constipation has also resolved. Patient has anemia of chronic kidney disease and was given IV Iron during the hospital stay. Patient has also undergone vein mapping, and will follow up with Vascular Surgery team and Nephrology on discharge. Patient is to continue diuretics, and bicarb has been prescribed as well. Patient is eager to be discharged back home today.  Consults: nephrology and vascular surgery  Significant Diagnostic Studies: Vein mapping  Discharge medication - Please see the med rec.  Discharge Exam: Blood pressure 149/73, pulse 62, temperature 98.4 F (36.9 C), temperature source Oral, resp. rate 17, height _0  (1.575 m), weight 79.833 kg (176 lb), last menstrual period 04/19/2015, SpO2 100 %.   Disposition: 01-Home or Self Care  Discharge Instructions    Call MD for:    Complete by:  As directed   Call MD for worsening symptoms     Diet - low sodium heart healthy    Complete by:  As directed   Renal diet     Discharge instructions    Complete  by:  As directed   Follow up with the Vascular Surgeon and Nephrologist. Follow up with the PCP within one week     Increase activity slowly    Complete by:  As directed             Medication List    STOP taking these medications        acetaminophen 500 MG tablet  Commonly known as:  TYLENOL      TAKE these medications        cloNIDine 0.3 MG tablet  Commonly known as:  CATAPRES  Take 1 tablet (0.3 mg total) by mouth 2 (two) times daily.     diltiazem 180 MG 24 hr capsule  Commonly known as:  CARDIZEM CD  Take 2 capsules (360 mg total) by mouth daily.     furosemide 40 MG tablet  Commonly known as:  LASIX  Take 1 tablet (40 mg total) by mouth 2 (two) times daily.     glucose blood test strip  Commonly known as:  FREESTYLE TEST STRIPS  Use as instructed     glucose blood test strip  Commonly known as:  TRUE METRIX BLOOD GLUCOSE TEST  Use as instructed     hydrALAZINE 100 MG tablet  Commonly known as:  APRESOLINE  Take 1 tablet (100 mg total) by mouth 3 (three) times daily.     insulin glargine 100 UNIT/ML injection  Commonly known as:  LANTUS  Inject 0.04 mLs (4 Units total) into the skin at bedtime.     multivitamin Tabs tablet  Take 1 tablet by mouth at bedtime.  polyethylene glycol packet  Commonly known as:  MIRALAX / GLYCOLAX  Take 17 g by mouth daily.     senna 8.6 MG Tabs tablet  Commonly known as:  SENOKOT  Take 2 tablets (17.2 mg total) by mouth daily.     sodium bicarbonate 650 MG tablet  Take 1 tablet (650 mg total) by mouth 3 (three) times daily.     TRUE METRIX METER w/Device Kit  1 each by Does not apply route 3 (three) times daily.     TRUEPLUS LANCETS 28G Misc  1 each by Does not apply route 3 (three) times daily.      ASK your doctor about these medications        ondansetron 4 MG tablet  Commonly known as:  ZOFRAN  Take 1 tablet (4 mg total) by mouth every 8 (eight) hours as needed for nausea or vomiting.            Follow-up Information    Follow up with [facility]. Schedule an appointment as soon as possible for a visit on 06/28/2015.   Why:  [facility] appointment on 06/28/15 at 10:45 am with Dr. Jarold Song.   Contact information:   [address] [place] [address] [phone]      Follow up with [facility] On 06/28/2015.   Why:  Dr. Donnetta Hutching.  Do not eat after midnight Sunday.  Vein Transposition   Contact information:   [place] [address] [phone]      Follow up with Lucrezia Starch, MD On 07/08/2015.   Specialty:  Nephrology   Why:  Nephrology. Appointment at 1230 PM.   Contact information:   [address] [place] [address] [phone]       Follow up In 1 week.      Follow up with Minerva Ends, MD In 1 week.   Specialty:  Family Medicine   Why:  Follow up with the PCP within one week, follow up with Vascular Surgeon and Nephrology   Contact information:   [place] [address] [phone]       Signed: Bonnell Public 06/24/2015, 1:16 PM

## 2015-06-24 NOTE — Progress Notes (Signed)
Patient given discharge instructions, and verbalized an understanding of all discharge instructions.  Patient agrees with discharge plan, and is being discharged in stable medical condition.  

## 2015-06-25 LAB — PARATHYROID HORMONE, INTACT (NO CA): PTH: 301 pg/mL — AB (ref 15–65)

## 2015-06-27 ENCOUNTER — Encounter (HOSPITAL_COMMUNITY)
Admission: RE | Disposition: A | Discharge status: Home / Self Care | Payer: Self-pay | Source: Ambulatory Visit | Attending: Vascular Surgery

## 2015-06-27 ENCOUNTER — Encounter: Payer: Self-pay | Admitting: Family Medicine

## 2015-06-27 ENCOUNTER — Telehealth: Payer: Self-pay

## 2015-06-27 ENCOUNTER — Ambulatory Visit (HOSPITAL_COMMUNITY): Payer: Medicaid Other | Admitting: Anesthesiology

## 2015-06-27 ENCOUNTER — Encounter (HOSPITAL_COMMUNITY): Payer: Self-pay | Admitting: Anesthesiology

## 2015-06-27 ENCOUNTER — Ambulatory Visit (HOSPITAL_COMMUNITY)
Admission: RE | Admit: 2015-06-27 | Discharge: 2015-06-27 | Disposition: A | Discharge status: Home / Self Care | Payer: Medicaid Other | Source: Ambulatory Visit | Attending: Vascular Surgery | Admitting: Vascular Surgery

## 2015-06-27 DIAGNOSIS — E1122 Type 2 diabetes mellitus with diabetic chronic kidney disease: Secondary | ICD-10-CM | POA: Insufficient documentation

## 2015-06-27 DIAGNOSIS — N185 Chronic kidney disease, stage 5: Secondary | ICD-10-CM | POA: Insufficient documentation

## 2015-06-27 DIAGNOSIS — Z87891 Personal history of nicotine dependence: Secondary | ICD-10-CM | POA: Diagnosis not present

## 2015-06-27 DIAGNOSIS — N2581 Secondary hyperparathyroidism of renal origin: Secondary | ICD-10-CM | POA: Insufficient documentation

## 2015-06-27 DIAGNOSIS — I120 Hypertensive chronic kidney disease with stage 5 chronic kidney disease or end stage renal disease: Secondary | ICD-10-CM | POA: Diagnosis not present

## 2015-06-27 DIAGNOSIS — N189 Chronic kidney disease, unspecified: Secondary | ICD-10-CM | POA: Diagnosis present

## 2015-06-27 DIAGNOSIS — E1165 Type 2 diabetes mellitus with hyperglycemia: Secondary | ICD-10-CM | POA: Diagnosis not present

## 2015-06-27 DIAGNOSIS — K5900 Constipation, unspecified: Secondary | ICD-10-CM | POA: Diagnosis not present

## 2015-06-27 DIAGNOSIS — Z794 Long term (current) use of insulin: Secondary | ICD-10-CM | POA: Insufficient documentation

## 2015-06-27 DIAGNOSIS — Z79899 Other long term (current) drug therapy: Secondary | ICD-10-CM | POA: Insufficient documentation

## 2015-06-27 DIAGNOSIS — N184 Chronic kidney disease, stage 4 (severe): Secondary | ICD-10-CM

## 2015-06-27 HISTORY — DX: Unspecified hemorrhoids: K64.9

## 2015-06-27 HISTORY — DX: Cardiac murmur, unspecified: R01.1

## 2015-06-27 HISTORY — DX: Gastro-esophageal reflux disease without esophagitis: K21.9

## 2015-06-27 HISTORY — PX: BASCILIC VEIN TRANSPOSITION: SHX5742

## 2015-06-27 LAB — GLUCOSE, CAPILLARY
GLUCOSE-CAPILLARY: 53 mg/dL — AB (ref 65–99)
GLUCOSE-CAPILLARY: 95 mg/dL (ref 65–99)
Glucose-Capillary: 265 mg/dL — ABNORMAL HIGH (ref 65–99)
Glucose-Capillary: 138 mg/dL — ABNORMAL HIGH (ref 65–99)
Glucose-Capillary: 52 mg/dL — ABNORMAL LOW (ref 65–99)

## 2015-06-27 LAB — BASIC METABOLIC PANEL
Anion gap: 11 (ref 5–15)
BUN: 63 mg/dL — AB (ref 6–20)
CALCIUM: 9 mg/dL (ref 8.9–10.3)
CO2: 19 mmol/L — AB (ref 22–32)
CREATININE: 7.24 mg/dL — AB (ref 0.44–1.00)
Chloride: 108 mmol/L (ref 101–111)
GFR calc non Af Amer: 6 mL/min — ABNORMAL LOW (ref >60)
GFR, EST AFRICAN AMERICAN: 7 mL/min — AB (ref >60)
Glucose, Bld: 229 mg/dL — ABNORMAL HIGH (ref 65–99)
Potassium: 4.1 mmol/L (ref 3.5–5.1)
Sodium: 138 mmol/L (ref 135–145)

## 2015-06-27 LAB — HCG, SERUM, QUALITATIVE: PREG SERUM: NEGATIVE

## 2015-06-27 LAB — CBC
HCT: 25.9 % — ABNORMAL LOW (ref 36.0–46.0)
Hemoglobin: 8 g/dL — ABNORMAL LOW (ref 12.0–15.0)
MCH: 26.5 pg (ref 26.0–34.0)
MCHC: 30.9 g/dL (ref 30.0–36.0)
MCV: 85.8 fL (ref 78.0–100.0)
PLATELETS: 223 10*3/uL (ref 150–400)
RBC: 3.02 MIL/uL — AB (ref 3.87–5.11)
RDW: 16.4 % — AB (ref 11.5–15.5)
WBC: 7.3 10*3/uL (ref 4.0–10.5)

## 2015-06-27 LAB — PROTIME-INR
INR: 1.06 (ref 0.00–1.49)
PROTHROMBIN TIME: 14 s (ref 11.6–15.2)

## 2015-06-27 SURGERY — TRANSPOSITION, VEIN, BASILIC
Anesthesia: General | Site: Arm Upper | Laterality: Left

## 2015-06-27 MED ORDER — HYDROMORPHONE HCL 1 MG/ML IJ SOLN: 0.2500 mg | INTRAMUSCULAR | Status: DC | PRN

## 2015-06-27 MED ORDER — DIPHENHYDRAMINE HCL 50 MG/ML IJ SOLN
INTRAMUSCULAR | Status: AC
  Filled 2015-06-27: qty 1

## 2015-06-27 MED ORDER — MIDAZOLAM HCL 2 MG/2ML IJ SOLN
INTRAMUSCULAR | Status: AC
  Filled 2015-06-27: qty 2

## 2015-06-27 MED ORDER — PROPOFOL 10 MG/ML IV BOLUS
INTRAVENOUS | Status: AC
  Filled 2015-06-27: qty 20

## 2015-06-27 MED ORDER — LIDOCAINE HCL (CARDIAC) 20 MG/ML IV SOLN
INTRAVENOUS | Status: DC | PRN
  Administered 2015-06-27 (×2): 50 mg via INTRAVENOUS

## 2015-06-27 MED ORDER — INSULIN ASPART 100 UNIT/ML ~~LOC~~ SOLN
SUBCUTANEOUS | Status: AC
  Filled 2015-06-27: qty 1

## 2015-06-27 MED ORDER — FENTANYL CITRATE (PF) 250 MCG/5ML IJ SOLN
INTRAMUSCULAR | Status: AC
  Filled 2015-06-27: qty 5

## 2015-06-27 MED ORDER — LIDOCAINE 2% (20 MG/ML) 5 ML SYRINGE
INTRAMUSCULAR | Status: AC
  Filled 2015-06-27: qty 5

## 2015-06-27 MED ORDER — MEPERIDINE HCL 25 MG/ML IJ SOLN: 6.2500 mg | INTRAMUSCULAR | Status: DC | PRN

## 2015-06-27 MED ORDER — LIDOCAINE-EPINEPHRINE (PF) 1 %-1:200000 IJ SOLN
INTRAMUSCULAR | Status: DC | PRN
  Administered 2015-06-27: 30 mL via INTRADERMAL

## 2015-06-27 MED ORDER — ONDANSETRON HCL 4 MG/2ML IJ SOLN
INTRAMUSCULAR | Status: DC | PRN
  Administered 2015-06-27: 4 mg via INTRAVENOUS

## 2015-06-27 MED ORDER — SODIUM CHLORIDE 0.9 % IV SOLN
INTRAVENOUS | Status: DC
  Administered 2015-06-27: 09:00:00 via INTRAVENOUS

## 2015-06-27 MED ORDER — 0.9 % SODIUM CHLORIDE (POUR BTL) OPTIME
TOPICAL | Status: DC | PRN
  Administered 2015-06-27: 1000 mL

## 2015-06-27 MED ORDER — LIDOCAINE-EPINEPHRINE (PF) 1 %-1:200000 IJ SOLN
INTRAMUSCULAR | Status: AC
  Filled 2015-06-27: qty 30

## 2015-06-27 MED ORDER — PROPOFOL 500 MG/50ML IV EMUL
INTRAVENOUS | Status: DC | PRN
  Administered 2015-06-27: 100 ug/kg/min via INTRAVENOUS

## 2015-06-27 MED ORDER — DEXMEDETOMIDINE HCL 200 MCG/2ML IV SOLN
INTRAVENOUS | Status: DC | PRN
  Administered 2015-06-27: 8 ug via INTRAVENOUS
  Administered 2015-06-27 (×4): 4 ug via INTRAVENOUS
  Administered 2015-06-27: 8 ug via INTRAVENOUS
  Administered 2015-06-27 (×2): 4 ug via INTRAVENOUS

## 2015-06-27 MED ORDER — OXYCODONE-ACETAMINOPHEN 5-325 MG PO TABS: 1.0000 | ORAL_TABLET | Freq: Four times a day (QID) | ORAL | Status: DC | PRN

## 2015-06-27 MED ORDER — GLYCOPYRROLATE 0.2 MG/ML IV SOSY
PREFILLED_SYRINGE | INTRAVENOUS | Status: AC
  Filled 2015-06-27: qty 3

## 2015-06-27 MED ORDER — PROMETHAZINE HCL 25 MG/ML IJ SOLN: 6.2500 mg | INTRAMUSCULAR | Status: DC | PRN

## 2015-06-27 MED ORDER — ONDANSETRON HCL 4 MG/2ML IJ SOLN
INTRAMUSCULAR | Status: AC
  Filled 2015-06-27: qty 2

## 2015-06-27 MED ORDER — GLYCOPYRROLATE 0.2 MG/ML IJ SOLN
INTRAMUSCULAR | Status: DC | PRN
  Administered 2015-06-27: 0.1 mg via INTRAVENOUS

## 2015-06-27 MED ORDER — FENTANYL CITRATE (PF) 100 MCG/2ML IJ SOLN
INTRAMUSCULAR | Status: DC | PRN
  Administered 2015-06-27 (×2): 25 ug via INTRAVENOUS
  Administered 2015-06-27 (×2): 50 ug via INTRAVENOUS
  Administered 2015-06-27 (×4): 25 ug via INTRAVENOUS

## 2015-06-27 MED ORDER — DEXMEDETOMIDINE HCL IN NACL 200 MCG/50ML IV SOLN
INTRAVENOUS | Status: AC
  Filled 2015-06-27: qty 50

## 2015-06-27 MED ORDER — INSULIN ASPART 100 UNIT/ML ~~LOC~~ SOLN
10.0000 [IU] | Freq: Once | SUBCUTANEOUS | Status: AC
  Administered 2015-06-27: 10 [IU] via SUBCUTANEOUS

## 2015-06-27 MED ORDER — MIDAZOLAM HCL 5 MG/5ML IJ SOLN
INTRAMUSCULAR | Status: DC | PRN
  Administered 2015-06-27: 2 mg via INTRAVENOUS

## 2015-06-27 MED ORDER — HEPARIN SODIUM (PORCINE) 5000 UNIT/ML IJ SOLN
INTRAMUSCULAR | Status: DC | PRN
  Administered 2015-06-27: 09:00:00

## 2015-06-27 MED ORDER — DIPHENHYDRAMINE HCL 50 MG/ML IJ SOLN
INTRAMUSCULAR | Status: DC | PRN
  Administered 2015-06-27 (×2): 12.5 mg via INTRAVENOUS

## 2015-06-27 MED ORDER — DEXTROSE 5 % IV SOLN
1.5000 g | INTRAVENOUS | Status: DC
  Filled 2015-06-27: qty 1.5

## 2015-06-27 SURGICAL SUPPLY — 32 items
BENZOIN TINCTURE PRP APPL 2/3 (GAUZE/BANDAGES/DRESSINGS) ×3 IMPLANT
CANISTER SUCTION 2500CC (MISCELLANEOUS) ×3 IMPLANT
CANNULA VESSEL 3MM 2 BLNT TIP (CANNULA) ×3 IMPLANT
CLIP LIGATING EXTRA MED SLVR (CLIP) ×3 IMPLANT
CLIP LIGATING EXTRA SM BLUE (MISCELLANEOUS) ×3 IMPLANT
CLOSURE STERI-STRIP 1/2X4 (GAUZE/BANDAGES/DRESSINGS) ×1
CLOSURE WOUND 1/2 X4 (GAUZE/BANDAGES/DRESSINGS) ×1
CLSR STERI-STRIP ANTIMIC 1/2X4 (GAUZE/BANDAGES/DRESSINGS) ×2 IMPLANT
COVER PROBE W GEL 5X96 (DRAPES) ×3 IMPLANT
DECANTER SPIKE VIAL GLASS SM (MISCELLANEOUS) ×3 IMPLANT
ELECT REM PT RETURN 9FT ADLT (ELECTROSURGICAL) ×3
ELECTRODE REM PT RTRN 9FT ADLT (ELECTROSURGICAL) ×1 IMPLANT
GAUZE SPONGE 4X4 12PLY STRL (GAUZE/BANDAGES/DRESSINGS) ×3 IMPLANT
GEL ULTRASOUND 20GR AQUASONIC (MISCELLANEOUS) IMPLANT
GLOVE SS BIOGEL STRL SZ 7.5 (GLOVE) ×1 IMPLANT
GLOVE SUPERSENSE BIOGEL SZ 7.5 (GLOVE) ×2
GOWN STRL REUS W/ TWL LRG LVL3 (GOWN DISPOSABLE) ×3 IMPLANT
GOWN STRL REUS W/TWL LRG LVL3 (GOWN DISPOSABLE) ×6
KIT BASIN OR (CUSTOM PROCEDURE TRAY) ×3 IMPLANT
KIT ROOM TURNOVER OR (KITS) ×3 IMPLANT
NS IRRIG 1000ML POUR BTL (IV SOLUTION) ×3 IMPLANT
PACK CV ACCESS (CUSTOM PROCEDURE TRAY) ×3 IMPLANT
PAD ARMBOARD 7.5X6 YLW CONV (MISCELLANEOUS) ×6 IMPLANT
STRIP CLOSURE SKIN 1/2X4 (GAUZE/BANDAGES/DRESSINGS) ×2 IMPLANT
SUT PROLENE 6 0 CC (SUTURE) ×6 IMPLANT
SUT SILK 2 0 SH (SUTURE) ×6 IMPLANT
SUT SILK 3 0 (SUTURE) ×4
SUT SILK 3-0 18XBRD TIE 12 (SUTURE) ×2 IMPLANT
SUT VIC AB 3-0 SH 27 (SUTURE) ×2
SUT VIC AB 3-0 SH 27X BRD (SUTURE) ×1 IMPLANT
UNDERPAD 30X30 INCONTINENT (UNDERPADS AND DIAPERS) ×3 IMPLANT
WATER STERILE IRR 1000ML POUR (IV SOLUTION) ×3 IMPLANT

## 2015-06-27 NOTE — H&P (View-Only) (Signed)
Consult Note  Patient name: Crystal Tanner MRN: [id number] DOB: May 18, 1970 Sex: female  Consulting Physician:  Dr. Lorrene Reid  Reason for Consult:  Chief Complaint  Patient presents with  . Nausea  . Emesis    HISTORY OF PRESENT ILLNESS: 45 year old right handed female with history of hypertension and diabetes who just moved from [place] 6 months ago.  She has developed worsening renal function, acute on chronic, and it in need of permanent dialysis access.  She has not started renal replacement therapy.  She was admitted with vomiting and constipation.  She has been a diabetic since age 50.  It is not well controlled  Past Medical History  Diagnosis Date  . Hypertension   . Diabetes mellitus without complication ([place]) 8185  . Gastritis   . Chronic kidney disease   . Acute kidney injury ([place])   . Anemia   . Renal cyst, left   . Constipation     Past Surgical History  Procedure Laterality Date  . Abscess drainage      Laparoscopic, abdominal    Social History   Social History  . Marital Status: Single    Spouse Name: N/A  . Number of Children: N/A  . Years of Education: N/A   Occupational History  . Not on file.   Social History Main Topics  . Smoking status: Former Smoker    Quit date: 02/26/2015  . Smokeless tobacco: Never Used  . Alcohol Use: No     Comment: less than a ppd, "not much"  . Drug Use: Yes    Special: Marijuana     Comment: Last used 04/17/2015  . Sexual Activity: No   Other Topics Concern  . Not on file   Social History Narrative    Family History  Problem Relation Age of Onset  . Hypertension Mother   . Diabetes Mother     Allergies as of 06/20/2015  . (No Known Allergies)    No current facility-administered medications on file prior to encounter.   Current Outpatient Prescriptions on File Prior to Encounter  Medication Sig Dispense Refill  . acetaminophen (TYLENOL) 500 MG tablet Take 1,000 mg by mouth every 6 (six)  hours as needed for moderate pain or headache. Reported on 04/05/2015    . cloNIDine (CATAPRES) 0.3 MG tablet Take 1 tablet (0.3 mg total) by mouth 2 (two) times daily. 60 tablet 5  . diltiazem (CARDIZEM CD) 180 MG 24 hr capsule Take 2 capsules (360 mg total) by mouth daily. 60 capsule 5  . hydrALAZINE (APRESOLINE) 100 MG tablet Take 1 tablet (100 mg total) by mouth 3 (three) times daily. 90 tablet 5  . insulin glargine (LANTUS) 100 UNIT/ML injection Inject 0.04 mLs (4 Units total) into the skin at bedtime. (Patient taking differently: Inject 12 Units into the skin at bedtime. ) 10 mL 0  . ondansetron (ZOFRAN) 4 MG tablet Take 1 tablet (4 mg total) by mouth every 8 (eight) hours as needed for nausea or vomiting. 20 tablet 0  . polyethylene glycol (MIRALAX / GLYCOLAX) packet Take 17 g by mouth daily. (Patient taking differently: Take 17 g by mouth daily as needed for mild constipation or moderate constipation. ) 30 each 2  . senna (SENOKOT) 8.6 MG TABS tablet Take 2 tablets (17.2 mg total) by mouth daily. 120 each 0  . Blood Glucose Monitoring Suppl (TRUE METRIX METER) w/Device KIT 1 each by Does not apply route  3 (three) times daily. 1 kit 0  . glucose blood (FREESTYLE TEST STRIPS) test strip Use as instructed 100 each 12  . glucose blood (TRUE METRIX BLOOD GLUCOSE TEST) test strip Use as instructed 100 each 12  . TRUEPLUS LANCETS 28G MISC 1 each by Does not apply route 3 (three) times daily. 100 each 11     REVIEW OF SYSTEMS: Cardiovascular: No chest pain, chest pressure, palpitations, orthopnea, or dyspnea on exertion. No claudication or rest pain,  No history of DVT or phlebitis. Pulmonary: No productive cough, asthma or wheezing. Neurologic: No weakness, paresthesias, aphasia, or amaurosis. No dizziness. Hematologic: No bleeding problems or clotting disorders. Musculoskeletal: No joint pain or joint swelling. Gastrointestinal: vomiting and constipation Genitourinary: No dysuria or  hematuria. Psychiatric:: No history of major depression. Integumentary: No rashes or ulcers. Constitutional: No fever or chills.  PHYSICAL EXAMINATION: General: The patient appears their stated age.  Vital signs are BP 163/75 mmHg  Pulse 69  Temp(Src) 99.3 F (37.4 C) (Oral)  Resp 18  Ht 5' 2"  (1.575 m)  Wt 176 lb (79.833 kg)  BMI 32.18 kg/m2  SpO2 100%  LMP 04/19/2015 Pulmonary: Respirations are non-labored HEENT:  No gross abnormalities Abdomen: Soft and non-tender  Musculoskeletal: There are no major deformities.   Neurologic: No focal weakness or paresthesias are detected, Skin: There are no ulcer or rashes noted. Psychiatric: The patient has normal affect. Cardiovascular: There is a regular rate and rhythm without significant murmur appreciated.  Palpable left radial and brachial pulse  Diagnostic Studies: I have reviewed her vein mapping with the following results: Right Upper Extremity Vein Map    Cephalic  Segment Diameter Depth Comment  1. Axilla mm mm Unable to follow  2. Mid upper arm mm mm Unable to follow  3. Above AC 1.81m 3.561m  4. In AC 2.44m70m.0mm60m5. Below AC 2.3mm 44mmm  444m Mid forearm 2.5mm 4.70m   769mrist 3.0mm 3.4.5OP 9.2TWlic  Segment Diameter Depth Comment  2. Mid upper arm 3.8mm 11.144mEnter26mrachial  3. Above AC 3.8mm 7.0mm 63m. In1m 3.8mm 6.4mm   48mBelo39mC 3.5mm 3.5mm   6.344md fo34mrm 3.0mm 3.1mm   7. W89mt 2.60m 1.1mm    VASCU78m LAB9mELIMINARY PRELIMINARY PRELIMINARY PRELIMINARY  Left Upper Extremity Vein Map    Cephalic  Segment Diameter Depth Comment  1. Axilla 1.6mm 2.8mm   2. Mid u29mr ar944m.1mm 3.1mm   3. Above A43m.1mm46m0mm   4. In AC 3.123m2.1m344m 5. Below AC 2.69m 4.180m  6. Mid forearm 234mm 2.21m   7. Wrist 3.44mm 2.9108mThro18msed at the IV s66m onl58m Basilic  Segment Diameter Depth Comment   2. Mid upper arm 3.4 mm 10.3mm   3. Above AC 3.4mm 10.1mm   440mn AC 3.8mm 3.4mm  84m Belo89mC 4.3mm 4.4mm  85m Mid21mrearm 3.44mm 4.44mm 69m. Wr60m mm mm Unable to visu48mze d14mto possibly the size or that it runs extremely close to the bone    All areas of the veins imaged were patent and compressible except for the short segment of the left basilic at the wrist        Assessment:  CKD stage V Plan: In this right handed female, I discussed proceeding with a left basilic vein trasnposition.  We discussed doing this in 2 stages.  The risks and benefits  were discussed, including but not limited to non-maturity, steal syndrome, and the need for future proceedures.  All of her questions were answered.  She is scheduled for Monday June 26 with Dr. Donnetta Hutching.  If she is discharged prior to her surgery, she will need to be at [facility] short stay at 630 Monday morning.  She needs to be reminded to be NPO after midnight on Sunday     V. Leia Alf, M.D. [facility] Office: [phone] Pager:  [phone]

## 2015-06-27 NOTE — Progress Notes (Signed)
CBG=52. Dr. Renold DonGermeroth notified. Pt awake, alert, drinking, and eating crackers and peanut butter. Will recheck prior to discharge. No further orders.

## 2015-06-27 NOTE — Interval H&P Note (Signed)
History and Physical Interval Note:  06/27/2015 8:50 AM  Crystal Tanner  has presented today for surgery, with the diagnosis of Stage V Chronic Kidney Disease N18.5  The various methods of treatment have been discussed with the patient and family. After consideration of risks, benefits and other options for treatment, the patient has consented to  Procedure(s): FIRST STAGE BASILIC VEIN TRANSPOSITION (Left) as a surgical intervention .  The patient's history has been reviewed, patient examined, no change in status, stable for surgery.  I have reviewed the patient's chart and labs.  Questions were answered to the patient's satisfaction.     Gretta BeganEarly, Alexes Menchaca

## 2015-06-27 NOTE — Progress Notes (Signed)
CBG=95. Dr.Germeroth notified. Okay to d/c pt home.

## 2015-06-27 NOTE — Telephone Encounter (Signed)
[facility] Post-discharge Follow-Up Phone Call:  Date of Discharge: 06/24/2015 Principal Discharge Diagnosis(es): CKD - stage V, DM, acute renal failure.anemia, constipation. Left upper arm basilic vein transposition fistula - 06/27/15. Post-discharge Communication: (Clearly document all attempts clearly and date contact made) Call placed to the patient Call Completed: Yes                    With Whom: Patient Interpreter Needed: No     Please check all that apply:  X  Patient is knowledgeable of his/her condition(s) and/or treatment. She stated that she just returned home from the hospital. X  Patient is caring for self at home.  - She said that her father is available to help her as needed and she has adequate food.She noted that she is going out of town from 06/28/15 ( after her MD appointment ) until 07/06/15. She noted that she has enough medication for her trip, ? Patient is receiving assist at home from family and/or caregiver. Family and/or caregiver is knowledgeable of patient's condition(s) and/or treatment. ? Patient is receiving home health services. If so, name of agency.     Medication Reconciliation:  X  Medication list reviewed with patient.  - reviewed in detail and instructed the patient to bring all of her medications to her appointment tomorrow and she stated that she would. She said that she checks her blood sugars three times a day but has  never kept a log and has been checking her blood sugars for 20+ years. She did note that she would start keeping a log tomorrow. She then stated that she is aware that the discharge instructions indicate to take lantus 4 units at bedtime but she takes 12 units at bedtime and feels that is what is needed to manage her blood sugars.She reported that her blood sugars range in the low 100's.  She also said that she takes novolog on a sliding scale TID if needed and she is aware that this is also not on the discharge instructions.  She said that she usually only needs the novolog once a day to maintain her blood sugars in a acceptable range. This CM reinforced the discharge instructions and importance of compliance and noted that the discharge summary also indicates that the patient is to stop acetaminophen 500mg .  This CM encouraged her to discuss her medication concerns with the doctor tomorrow. . In addition, she is concerned about taking the furosemide and will also discuss with the doctor tomorrow.  ? Patient obtained all discharge medications. If not, why? She has all medication but only has part of the sodium bicarbonate prescription.  [facility] of the remainder of the prescription and she hopes to pick it up tomorrow.    Activities of Daily Living:  X  Independent ? Needs assist (describe; ? home DME used) ? Total Care (describe, ? home DME used)   Community resources in place for patient:  X None  ? Home Health/Home DME ? Assisted Living ? Support Group        Questions/Concerns discussed: [facility], 06/28/15 @ 1045. She also stated that she has transportation to the clinic.  Discussed the need for the additional documentation for [facility].She stated that she does not think that she will need to [facility] as she was seen by a medicaid representative when  she was in the hospital. Informed her that she can still apply for the [facility] while the medicaid is pending.   She had no other questions/problems to report at this time.   Message sent to Dr Venetia NightAmao informing her of the insulin that the patient has been taking.

## 2015-06-27 NOTE — Transfer of Care (Signed)
Immediate Anesthesia Transfer of Care Note  Patient: Crystal Tanner  Procedure(s) Performed: Procedure(s): FIRST STAGE BASILIC VEIN TRANSPOSITION (Left)  Patient Location: PACU  Anesthesia Type:MAC  Level of Consciousness: awake, sedated and patient cooperative  Airway & Oxygen Therapy: Patient Spontanous Breathing and Patient connected to face mask oxygen  Post-op Assessment: Report given to RN and Post -op Vital signs reviewed and stable  Post vital signs: Reviewed and stable  Last Vitals:  Filed Vitals:   06/27/15 0806 06/27/15 1215  BP: 183/55   Temp: 37.2 C 36.8 C  Resp: 18     Last Pain: There were no vitals filed for this visit.    Patients Stated Pain Goal: 2 (06/27/15 0849)  Complications: No apparent anesthesia complications

## 2015-06-27 NOTE — Op Note (Signed)
    OPERATIVE REPORT  DATE OF SURGERY: 06/27/2015  PATIENT: Crystal Tanner, 45 y.o. female MRN: [id number]  DOB: 07-Jun-1970  PRE-OPERATIVE DIAGNOSIS: Chronic renal insufficiency  POST-OPERATIVE DIAGNOSIS:  Same  PROCEDURE: Left upper arm basilic vein transposition fistula  SURGEON:  Gretta Beganodd Tametha Banning, M.D.  PHYSICIAN ASSISTANT: Collins  ANESTHESIA:  Local with sedation  EBL: Minimal ml  Total I/O In: 700 [I.V.:700] Out: 5 [Blood:5]  BLOOD ADMINISTERED: None  DRAINS: none  SPECIMEN: None  COUNTS CORRECT:  YES  PLAN OF CARE: pACU   PATIENT DISPOSITION:  PACU - hemodynamically stable  PROCEDURE DETAILS: Patient was taken to the operative placed supine position where the area the left arm and that the delivery prepped and draped in sterile fashion. SonoSite ultrasound was used to visualize the basilic vein was of good caliber. The brachial artery was easily palpable at the antecubital space. Decision was made over the brachial artery has had minimal atherosclerotic change of good size. The  With a vessel loop. Incision was then made over the basilic vein just above the elbow. This was visualized with ultrasound. The vein was of good size. Tributary branches were ligated with 3 or 4 silk ties and divided. The vein was mobilized subcutaneous tunnel further down the forearm the vein was doubly clipped with hemoclips and divided. 2 separate incisions were made in the mid arm and upper arm near the axilla or harvesting the basilic vein. Tributary branches were ligated and divided. The vein was divided distally and was brought through the subcutaneous tunnel to exit at the level of the incision at the axilla. The vein was flushed with heparinized saline and was of good size. The vein is been marked tributary reduce her risk of twisting. A tunnel was created from the level of the antecubital space of the brachial artery to the axilla. The vein was brought through the tunnel again taking care not  to twist the vein. The brachial artery was occluded proximally and distally and was opened with an 11 blade and sent longitudinally with Potts scissors. A small arteriotomy created. The vein was cut to the appropriate length and was sewn end-to-side to the artery with a running 6-0 Prolene suture. Prior to completion of the closure the usual flushing maneuvers were undertaken. Anastomosis bleeding and not excellent thrill was noted. Wounds irrigated with saline. Hemostasis was obtained left cautery. The wounds were closed with 3-0 Vicryl in the subcutaneous and subcuticular tissue. The patient maintained a radial pulse with the fistula patent.  Sterile dressing was applied the patient was transferred to the recovery room in stable condition   Gretta Beganodd Cruz Devilla, M.D. 06/27/2015 1:26 PM

## 2015-06-27 NOTE — Anesthesia Postprocedure Evaluation (Signed)
Anesthesia Post Note  Patient: Crystal Tanner  Procedure(s) Performed: Procedure(s) (LRB): FIRST STAGE BASILIC VEIN TRANSPOSITION (Left)  Patient location during evaluation: PACU Anesthesia Type: MAC Level of consciousness: awake and alert Pain management: pain level controlled Vital Signs Assessment: post-procedure vital signs reviewed and stable Respiratory status: spontaneous breathing Cardiovascular status: stable Anesthetic complications: no    Last Vitals:  Filed Vitals:   06/27/15 1328 06/27/15 1330  BP:  139/67  Pulse:  50  Temp: 36.2 C   Resp:      Last Pain:  Filed Vitals:   06/27/15 1447  PainSc: 0-No pain                 Lewie LoronJohn Asiana Benninger

## 2015-06-27 NOTE — Anesthesia Preprocedure Evaluation (Addendum)
Anesthesia Evaluation   Patient awake    Reviewed: Allergy & Precautions, NPO status , Patient's Chart, lab work & pertinent test results, reviewed documented beta blocker date and time   Airway Mallampati: II  TM Distance: >3 FB Neck ROM: Full    Dental  (+) Edentulous Upper, Teeth Intact, Dental Advisory Given   Pulmonary former smoker,    Pulmonary exam normal        Cardiovascular hypertension, Pt. on medications Normal cardiovascular exam     Neuro/Psych    GI/Hepatic   Endo/Other  diabetes, Poorly Controlled, Type 2  Renal/GU CRFRenal disease     Musculoskeletal   Abdominal   Peds  Hematology   Anesthesia Other Findings   Reproductive/Obstetrics                            Anesthesia Physical Anesthesia Plan  ASA: IV  Anesthesia Plan: General   Post-op Pain Management:    Induction: Intravenous  Airway Management Planned: LMA  Additional Equipment:   Intra-op Plan:   Post-operative Plan:   Informed Consent:   Plan Discussed with:   Anesthesia Plan Comments:         Anesthesia Quick Evaluation

## 2015-06-27 NOTE — Telephone Encounter (Signed)
Phone call from pt's. Father.  Reported pt. came home from hospital, after having surgery today.   Reported she noticed some moisture on her left arm @ the incision.  Reported there was "a little bleeding" from the left upper arm incision.  Denied that there is active oozing of drainage or bleeding from the incision @ this time.  The pt. stated she noticed the moisture when raising her arm to change into a gown.  Reported the incision is intact, and the steri strips are in place.  Advised to apply gauze dressing to the left upper arm incision, and to monitor for bleeding.  Advised to keep the area clean and dry.  Pt's. father reported there is no continued bleeding noted at this time.  Advised that the drainage may have been caused by the movement and transportation home from the hospital.  Pt. And her father verb. Understanding.  Encouraged to call if any further concerns.

## 2015-06-28 ENCOUNTER — Telehealth: Payer: Self-pay

## 2015-06-28 ENCOUNTER — Ambulatory Visit: Payer: Self-pay | Attending: Family Medicine | Admitting: Family Medicine

## 2015-06-28 ENCOUNTER — Encounter: Payer: Self-pay | Admitting: Family Medicine

## 2015-06-28 VITALS — BP 130/60 | HR 52 | Temp 98.4°F | Resp 16 | Ht 64.0 in | Wt 167.6 lb

## 2015-06-28 DIAGNOSIS — Z794 Long term (current) use of insulin: Secondary | ICD-10-CM

## 2015-06-28 DIAGNOSIS — N185 Chronic kidney disease, stage 5: Secondary | ICD-10-CM

## 2015-06-28 DIAGNOSIS — K5909 Other constipation: Secondary | ICD-10-CM

## 2015-06-28 DIAGNOSIS — E1122 Type 2 diabetes mellitus with diabetic chronic kidney disease: Secondary | ICD-10-CM

## 2015-06-28 DIAGNOSIS — K5900 Constipation, unspecified: Secondary | ICD-10-CM

## 2015-06-28 DIAGNOSIS — D631 Anemia in chronic kidney disease: Secondary | ICD-10-CM

## 2015-06-28 LAB — GLUCOSE, POCT (MANUAL RESULT ENTRY): POC Glucose: 163 mg/dl — AB (ref 70–99)

## 2015-06-28 LAB — POCT I-STAT 4, (NA,K, GLUC, HGB,HCT)
GLUCOSE: 163 mg/dL — AB (ref 65–99)
HCT: 25 % — ABNORMAL LOW (ref 36.0–46.0)
HEMOGLOBIN: 8.5 g/dL — AB (ref 12.0–15.0)
Potassium: 4 mmol/L (ref 3.5–5.1)
Sodium: 142 mmol/L (ref 135–145)

## 2015-06-28 MED ORDER — INSULIN GLARGINE 100 UNIT/ML ~~LOC~~ SOLN: 12.0000 [IU] | Freq: Every day | SUBCUTANEOUS | Status: DC

## 2015-06-28 NOTE — Patient Instructions (Signed)
Diabetes Mellitus and Food It is important for you to manage your blood sugar (glucose) level. Your blood glucose level can be greatly affected by what you eat. Eating healthier foods in the appropriate amounts throughout the day at about the same time each day will help you control your blood glucose level. It can also help slow or prevent worsening of your diabetes mellitus. Healthy eating may even help you improve the level of your blood pressure and reach or maintain a healthy weight.  General recommendations for healthful eating and cooking habits include:  Eating meals and snacks regularly. Avoid going long periods of time without eating to lose weight.  Eating a diet that consists mainly of plant-based foods, such as fruits, vegetables, nuts, legumes, and whole grains.  Using low-heat cooking methods, such as baking, instead of high-heat cooking methods, such as deep frying. Work with your dietitian to make sure you understand how to use the Nutrition Facts information on food labels. HOW CAN FOOD AFFECT ME? Carbohydrates Carbohydrates affect your blood glucose level more than any other type of food. Your dietitian will help you determine how many carbohydrates to eat at each meal and teach you how to count carbohydrates. Counting carbohydrates is important to keep your blood glucose at a healthy level, especially if you are using insulin or taking certain medicines for diabetes mellitus. Alcohol Alcohol can cause sudden decreases in blood glucose (hypoglycemia), especially if you use insulin or take certain medicines for diabetes mellitus. Hypoglycemia can be a life-threatening condition. Symptoms of hypoglycemia (sleepiness, dizziness, and disorientation) are similar to symptoms of having too much alcohol.  If your health care provider has given you approval to drink alcohol, do so in moderation and use the following guidelines:  Women should not have more than one drink per day, and men  should not have more than two drinks per day. One drink is equal to:  12 oz of beer.  5 oz of wine.  1 oz of hard liquor.  Do not drink on an empty stomach.  Keep yourself hydrated. Have water, diet soda, or unsweetened iced tea.  Regular soda, juice, and other mixers might contain a lot of carbohydrates and should be counted. WHAT FOODS ARE NOT RECOMMENDED? As you make food choices, it is important to remember that all foods are not the same. Some foods have fewer nutrients per serving than other foods, even though they might have the same number of calories or carbohydrates. It is difficult to get your body what it needs when you eat foods with fewer nutrients. Examples of foods that you should avoid that are high in calories and carbohydrates but low in nutrients include:  Trans fats (most processed foods list trans fats on the Nutrition Facts label).  Regular soda.  Juice.  Candy.  Sweets, such as cake, pie, doughnuts, and cookies.  Fried foods. WHAT FOODS CAN I EAT? Eat nutrient-rich foods, which will nourish your body and keep you healthy. The food you should eat also will depend on several factors, including:  The calories you need.  The medicines you take.  Your weight.  Your blood glucose level.  Your blood pressure level.  Your cholesterol level. You should eat a variety of foods, including:  Protein.  Lean cuts of meat.  Proteins low in saturated fats, such as fish, egg whites, and beans. Avoid processed meats.  Fruits and vegetables.  Fruits and vegetables that may help control blood glucose levels, such as apples, mangoes, and   yams.  Dairy products.  Choose fat-free or low-fat dairy products, such as milk, yogurt, and cheese.  Grains, bread, pasta, and rice.  Choose whole grain products, such as multigrain bread, whole oats, and brown rice. These foods may help control blood pressure.  Fats.  Foods containing healthful fats, such as nuts,  avocado, olive oil, canola oil, and fish. DOES EVERYONE WITH DIABETES MELLITUS HAVE THE SAME MEAL PLAN? Because every person with diabetes mellitus is different, there is not one meal plan that works for everyone. It is very important that you meet with a dietitian who will help you create a meal plan that is just right for you.   This information is not intended to replace advice given to you by your health care provider. Make sure you discuss any questions you have with your health care provider.   Document Released: 09/14/2004 Document Revised: 01/08/2014 Document Reviewed: 11/14/2012 Elsevier Interactive Patient Education 2016 Elsevier Inc.  

## 2015-06-28 NOTE — Progress Notes (Addendum)
[facility]  Date of telephone encounter: 06/27/15  Hospitalization dates: 06/20/15 through 06/24/15  Subjective:  Patient ID: Crystal Tanner, female    DOB: 05/24/1970  Age: 45 y.o. MRN: [id number]  CC: Follow-up   HPI Kaelynne Dikes is a 45 year old female with a history of type 2 diabetes mellitus (A1c 5.9 from 06/2015), stage V chronic kidney disease, hypertension, anemia of chronic disease who presented to the ED with nausea, vomiting and constipation.  She was seen by nephrology and thought not to need dialysis at the moment but had vein mapping done and AV fistula scheduled for outpatient with vascular surgery. She was also placed on sodium bicarbonate and advised to continue with Lasix. Her constipation was treated with enema.  Since discharge she has been to see vascular surgery and had AV fistula in left arm placed yesterday but she has noticed some swelling of the left arm. She is wondering if she needs to remain on Lasix which I have encouraged her to continue as per nephrology recommendation. She has been taking 12 units of Lantus and also NovoLog sliding scale which she received from [place] prior to her location 6 months ago even though her chart reflects she should be on Lantus 4 units. She states fasting sugars are in the 110s when she takes 12 units of Lantus at bedtime.  Outpatient Prescriptions Prior to Visit  Medication Sig Dispense Refill  . Blood Glucose Monitoring Suppl (TRUE METRIX METER) w/Device KIT 1 each by Does not apply route 3 (three) times daily. 1 kit 0  . cloNIDine (CATAPRES) 0.3 MG tablet Take 1 tablet (0.3 mg total) by mouth 2 (two) times daily. 60 tablet 5  . diltiazem (CARDIZEM CD) 180 MG 24 hr capsule Take 2 capsules (360 mg total) by mouth daily. 60 capsule 5  . furosemide (LASIX) 40 MG tablet Take 1 tablet (40 mg total) by mouth 2 (two) times daily. 60 tablet 1  . glucose blood (FREESTYLE TEST STRIPS) test strip Use as instructed  100 each 12  . glucose blood (TRUE METRIX BLOOD GLUCOSE TEST) test strip Use as instructed 100 each 12  . hydrALAZINE (APRESOLINE) 100 MG tablet Take 1 tablet (100 mg total) by mouth 3 (three) times daily. 90 tablet 5  . insulin glargine (LANTUS) 100 UNIT/ML injection Inject 0.04 mLs (4 Units total) into the skin at bedtime. (Patient taking differently: Inject 12 Units into the skin at bedtime. ) 10 mL 0  . multivitamin (RENA-VIT) TABS tablet Take 1 tablet by mouth at bedtime. 30 tablet 3  . oxyCODONE-acetaminophen (PERCOCET/ROXICET) 5-325 MG tablet Take 1 tablet by mouth every 6 (six) hours as needed. 12 tablet 0  . sodium bicarbonate 650 MG tablet Take 1 tablet (650 mg total) by mouth 3 (three) times daily. 90 tablet 1  . TRUEPLUS LANCETS 28G MISC 1 each by Does not apply route 3 (three) times daily. 100 each 11  . polyethylene glycol (MIRALAX / GLYCOLAX) packet Take 17 g by mouth daily. (Patient not taking: Reported on 06/28/2015) 30 each 2  . senna (SENOKOT) 8.6 MG TABS tablet Take 2 tablets (17.2 mg total) by mouth daily. (Patient not taking: Reported on 06/28/2015) 120 each 0   No facility-administered medications prior to visit.    ROS Review of Systems  Constitutional: Negative for activity change, appetite change and fatigue.  HENT: Negative for congestion, sinus pressure and sore throat.   Eyes: Negative for visual disturbance.  Respiratory: Negative for cough,  chest tightness, shortness of breath and wheezing.   Cardiovascular: Negative for chest pain and palpitations.  Gastrointestinal: Positive for constipation. Negative for abdominal pain and abdominal distention.  Endocrine: Negative for polydipsia.  Genitourinary: Negative for dysuria and frequency.  Musculoskeletal: Negative for back pain and arthralgias.  Skin: Negative for rash.  Neurological: Negative for tremors, light-headedness and numbness.  Hematological: Does not bruise/bleed easily.  Psychiatric/Behavioral:  Negative for behavioral problems and agitation.    Objective:  BP 130/60 mmHg  Pulse 52  Temp(Src) 98.4 F (36.9 C) (Oral)  Resp 16  Ht 5' 4"  (1.626 m)  Wt 167 lb 9.6 oz (76.023 kg)  BMI 28.75 kg/m2  SpO2 100%  LMP 06/23/2015  BP/Weight 06/28/2015 06/27/2015 2/67/1245  Systolic BP 809 983 382  Diastolic BP 60 67 73  Wt. (Lbs) 167.6 176 -  BMI 28.75 32.18 -     Physical Exam  Constitutional: She is oriented to person, place, and time. She appears well-developed and well-nourished.  Eyes:  Periorbital edema  Cardiovascular: Normal rate and intact distal pulses.   Murmur (3/6 systolic murmur) heard. Pulmonary/Chest: Effort normal and breath sounds normal. She has no wheezes. She has no rales. She exhibits no tenderness.  Abdominal: Soft. Bowel sounds are normal. She exhibits no distension and no mass. There is no tenderness.  Musculoskeletal: She exhibits edema (edema of left upper extremity; left AVF in place).  Neurological: She is alert and oriented to person, place, and time.  Skin: Skin is warm and dry.  Psychiatric: She has a normal mood and affect.      CMP Latest Ref Rng 06/27/2015 06/24/2015 06/23/2015  Glucose 65 - 99 mg/dL 229(H) 87 218(H)  BUN 6 - 20 mg/dL 63(H) 60(H) 63(H)  Creatinine 0.44 - 1.00 mg/dL 7.24(H) 6.06(H) 6.12(H)  Sodium 135 - 145 mmol/L 138 139 138  Potassium 3.5 - 5.1 mmol/L 4.1 4.0 3.9  Chloride 101 - 111 mmol/L 108 110 111  CO2 22 - 32 mmol/L 19(L) 18(L) 18(L)  Calcium 8.9 - 10.3 mg/dL 9.0 9.2 8.4(L)  Total Protein 6.5 - 8.1 g/dL - - -  Total Bilirubin 0.3 - 1.2 mg/dL - - -  Alkaline Phos 38 - 126 U/L - - -  AST 15 - 41 U/L - - -  ALT 14 - 54 U/L - - -     CBC    Component Value Date/Time   WBC 7.3 06/27/2015 0835   RBC 3.02* 06/27/2015 0835   RBC 3.14* 06/22/2015 0931   HGB 8.0* 06/27/2015 0835   HCT 25.9* 06/27/2015 0835   PLT 223 06/27/2015 0835   MCV 85.8 06/27/2015 0835   MCH 26.5 06/27/2015 0835   MCHC 30.9 06/27/2015 0835    RDW 16.4* 06/27/2015 0835   LYMPHSABS 1.2 03/25/2015 1322   MONOABS 0.4 03/25/2015 1322   EOSABS 0.7 03/25/2015 1322   BASOSABS 0.0 03/25/2015 1322      Assessment & Plan:   1. Chronic constipation Discussed dietary modifications to prevent constipation Advised to use laxatives  2. CKD (chronic kidney disease), stage V (HCC) Status post placement of AV fistula Continue Lasix as per nephrology recommendations. Advised to keep appointment with [facility] on 07/08/15  3. Anemia, chronic renal failure, stage 5 (HCC) Anemia of chronic disease On Feraheme  4. Type 2 diabetes mellitus with stage 5 chronic kidney disease not on chronic dialysis, with long-term current use of insulin (HCC) Remain on Lantus 12 units at bedtime; use NovoLog only as  per sliding scale. I have explained to her the risk of becoming hypoglycemic especially in the setting of chronic kidney disease and she has been advised to reduce Lantus by 2 units in the event of hypoglycemia Acute blood sugar logs which will be reviewed at her next visit. - Glucose (CBG)  5.Hypertension Repeat blood pressure performed manually after 15 mins was normal even though initial was elevated   No orders of the defined types were placed in this encounter.    Follow-up: Return in about 2 weeks (around 07/12/2015) for TCC- follow up on Diabetes mellitus.   Arnoldo Morale MD

## 2015-06-28 NOTE — Telephone Encounter (Signed)
Phone call from pt.  Reported her hand and forearm are "a little swollen".   Stated she has not had any further drainage, since was reported 6/26.  Denied any pain, fever/ chills.  Encouraged to elevate left arm/hand above level of heart at intervals during day.  Advised some swelling is to be expected 24 hrs. Post op.  Also advised to keep incisional area clean and dry.  Advised she can shower, 48 hrs. after surgery.  Instructed not to place left arm in bathtub of water, due to potential for infection.  Encouraged to call office with any concerns.  Verb. Understanding.

## 2015-06-28 NOTE — Progress Notes (Signed)
Pt here for hospital F/U.  Pt denies pain. Pt has taken medications today.  Pt has eaten today.

## 2015-07-01 ENCOUNTER — Telehealth: Payer: Self-pay

## 2015-07-01 NOTE — Telephone Encounter (Signed)
This Case Manager placed call to patient to check status and to discuss scheduling Transitional Care follow-up appointment. Call placed to #[phone]. Patient's name and DOB verified. Inquired about patient's status; she denied any new problems or health concerns. Discussed need for Transitional Care follow-up appointment, and patient agreeable.  Appointment scheduled for 07/12/15 at 1045 with Dr. Venetia NightAmao. Patient appreciative of appointment. Informed patient that she should keep a log of blood glucose readings and informed her to bring log to her upcoming appointment (on 07/12/15) for Dr. Venetia NightAmao to review. Patient verbalized understanding. Also reviewed patient's insulin regimen with her (Lantus 12 units at bedtime, Novolog three times daily per sliding scale). Patient indicated she is being compliant with her insulin regimen.  Reminded patient to reduce Lantus dose by 2 units in event of hypoglycemia. Patient indicated she was aware and is also able to verbalize appropriate hypoglycemia management plan.  Patient appreciative of call. No additional needs/concerns identified.

## 2015-07-02 ENCOUNTER — Telehealth: Payer: Self-pay | Admitting: Vascular Surgery

## 2015-07-02 NOTE — Telephone Encounter (Signed)
Sched appt 7/25 at 10:30. Lm on hm# to inform pt.

## 2015-07-02 NOTE — Telephone Encounter (Signed)
-----   Message from Sharee PimpleMarilyn K McChesney, RN sent at 06/27/2015  1:04 PM EDT ----- Regarding: schedule   ----- Message -----    From: Lars MageEmma M Collins, PA-C    Sent: 06/27/2015  12:00 PM      To: [facility] Charge Pool  F/U in 4 weeks with Dr. Arbie CookeyEarly no study needed.  S/P Basilic vein transposition fistula creation.

## 2015-07-08 ENCOUNTER — Telehealth: Payer: Self-pay

## 2015-07-08 MED FILL — ?FUROSEMIDE 80MG TABLET: 80 | 30 days supply | Qty: 60 | Fill #0

## 2015-07-08 MED FILL — TRUE METRIX TEST STRIP: 24 days supply | Qty: 100 | Fill #4

## 2015-07-08 NOTE — Telephone Encounter (Signed)
This Case Manager placed call to patient to check on status. Patient denied any new problems or health concerns. She indicated she was being compliant with her medications and checking her blood sugars as directed. Inquired if patient keeping a log of her blood glucose readings, and she indicated she was keeping a log. Instructed patient to bring her blood glucose log to upcoming [facility] follow-up appointment on 07/12/15 at 1045 with Dr. Venetia NightAmao. Patient verbalized understanding. No additional needs/concerns identified.

## 2015-07-11 ENCOUNTER — Telehealth: Payer: Self-pay

## 2015-07-11 NOTE — Telephone Encounter (Signed)
Call placed to the patient to check on her status and to remind her of her appointment tomorrow, 07/12/15 @ 1045 [facility]. She said that she would be at her appointment and has transportation to the clinic.  She said that she has been keeping a log of her blood sugars and her blood sugars have been running in the 120's -130's. She stated that she is feeling ok" and did not report any problems/questions. Instructed her to bring all of her medications and her blood sugar log with her to the appointment and she stated that she would  She noted that she kept her nephrology appointment last week and plans to follow up again in a month.  Discussed the status of the document needed for her [employer] application and she said that she plans to apply for medicaid and hopes to go to DSS tomorrow.

## 2015-07-12 ENCOUNTER — Ambulatory Visit: Payer: Self-pay | Attending: Family Medicine | Admitting: Family Medicine

## 2015-07-12 ENCOUNTER — Telehealth: Payer: Self-pay

## 2015-07-12 ENCOUNTER — Encounter: Payer: Self-pay | Admitting: Family Medicine

## 2015-07-12 VITALS — BP 175/81 | HR 51 | Temp 98.2°F | Ht 67.5 in | Wt 184.0 lb

## 2015-07-12 DIAGNOSIS — N185 Chronic kidney disease, stage 5: Secondary | ICD-10-CM

## 2015-07-12 DIAGNOSIS — K5909 Other constipation: Secondary | ICD-10-CM

## 2015-07-12 DIAGNOSIS — Z794 Long term (current) use of insulin: Secondary | ICD-10-CM

## 2015-07-12 DIAGNOSIS — E1110 Type 2 diabetes mellitus with ketoacidosis without coma: Secondary | ICD-10-CM

## 2015-07-12 DIAGNOSIS — I150 Renovascular hypertension: Secondary | ICD-10-CM

## 2015-07-12 DIAGNOSIS — K5900 Constipation, unspecified: Secondary | ICD-10-CM

## 2015-07-12 DIAGNOSIS — D631 Anemia in chronic kidney disease: Secondary | ICD-10-CM

## 2015-07-12 DIAGNOSIS — E1310 Other specified diabetes mellitus with ketoacidosis without coma: Secondary | ICD-10-CM

## 2015-07-12 LAB — GLUCOSE, POCT (MANUAL RESULT ENTRY): POC Glucose: 328 mg/dl — AB (ref 70–99)

## 2015-07-12 LAB — POCT GLYCOSYLATED HEMOGLOBIN (HGB A1C): HEMOGLOBIN A1C: 6.9

## 2015-07-12 MED ORDER — INSULIN GLARGINE 100 UNIT/ML ~~LOC~~ SOLN: 12.0000 [IU] | Freq: Every day | SUBCUTANEOUS | Status: DC

## 2015-07-12 MED FILL — DILTIAZEM 24HR ER 180 MG CA: 180 | 30 days supply | Qty: 60 | Fill #3

## 2015-07-12 MED FILL — hydrALAZINE HCL 100 MG TABS: 100 | 30 days supply | Qty: 90 | Fill #3

## 2015-07-12 MED FILL — cloNIDine HCL 0.3 MG TABS: 0.3 | 30 days supply | Qty: 60 | Fill #3

## 2015-07-12 MED FILL — !LANTUS SOLOSTAR 100UNITS/M: 100 | 25 days supply | Qty: 3 | Fill #0

## 2015-07-12 NOTE — Telephone Encounter (Signed)
Pt. called to report increased pain and swelling of left upper arm.  Reported she has had blistering around the incisions.  Stated 2 blisters have gone down, and one blister is slightly red, pea-sized and located near the middle incision.  Stated the incisions remain closed; no redness or drainage.  Reported there is more stinging and burning with the incisions,  Stated she hasn't elevated her arm in 4-5 days.  Now stated the swelling has gotten worse.  Denied fever/ chills.  Reported was at another MD office today, and advised to call and report symptoms to surgeon's office.  Appt. offered at 9:15 AM  7/12 with Nurse Practitioner, for incision check.  Agreed.

## 2015-07-12 NOTE — Progress Notes (Signed)
[place]  Date of telephone encounter: 06/27/15  Hospitalization dates: 06/20/15 through 06/24/15  Subjective:    Patient ID: Crystal Tanner, female    DOB: 1970-09-28, 45 y.o.   MRN: [id number]  HPI She is a 45 year old female with a history of type 2 diabetes mellitus (A1c 5.9 from 06/2015), stage V chronic kidney disease, hypertension, anemia of chronic disease Hospitalized for uremia secondary to acute on chronic renal failure.  She was seen by nephrology and thought not to need dialysis at the moment but had vein mapping done and AV fistula scheduled for outpatient with vascular surgery. She was also placed on sodium bicarbonate and advised to continue with Lasix. Her constipation was treated with enema.  Since discharge she has been to see vascular surgery and had AV fistula in left arm  but she has noticed some swelling of the left arm; denies fever or erythema. She has also has an appointment with her nephrologist.   She has been taking 12 units of Lantus and also NovoLog sliding scale which she received from [place] prior to her location 6 months ago and denies hypoglycemic episodes   Past Medical History  Diagnosis Date  . Hypertension   . Gastritis   . Anemia   . Constipation   . Heart murmur     as a child- doesnt remember having echo.  . Diabetes mellitus without complication ([place]) 6333    Type II  . Chronic kidney disease   . Acute kidney injury ([place])   . Renal cyst, left   . GERD (gastroesophageal reflux disease)   . Hemorrhoid     Past Surgical History  Procedure Laterality Date  . Abscess drainage      Laparoscopic, abdominal  . Abscess drainage Left     arm  . Multiple tooth extractions    . Bascilic vein transposition Left 06/27/2015    Procedure: FIRST STAGE BASILIC VEIN TRANSPOSITION;  Surgeon: Rosetta Posner, MD;  Location: [place];  Service: Vascular;  Laterality: Left;    No Known Allergies  Current Outpatient Prescriptions on  File Prior to Visit  Medication Sig Dispense Refill  . Blood Glucose Monitoring Suppl (TRUE METRIX METER) w/Device KIT 1 each by Does not apply route 3 (three) times daily. 1 kit 0  . cloNIDine (CATAPRES) 0.3 MG tablet Take 1 tablet (0.3 mg total) by mouth 2 (two) times daily. 60 tablet 5  . diltiazem (CARDIZEM CD) 180 MG 24 hr capsule Take 2 capsules (360 mg total) by mouth daily. 60 capsule 5  . furosemide (LASIX) 40 MG tablet Take 1 tablet (40 mg total) by mouth 2 (two) times daily. (Patient taking differently: Take 80 mg by mouth 2 (two) times daily. ) 60 tablet 1  . glucose blood (FREESTYLE TEST STRIPS) test strip Use as instructed 100 each 12  . glucose blood (TRUE METRIX BLOOD GLUCOSE TEST) test strip Use as instructed 100 each 12  . hydrALAZINE (APRESOLINE) 100 MG tablet Take 1 tablet (100 mg total) by mouth 3 (three) times daily. 90 tablet 5  . insulin aspart (NOVOLOG) 100 UNIT/ML injection Inject into the skin 3 (three) times daily before meals. As per sliding scale    . insulin glargine (LANTUS) 100 UNIT/ML injection Inject 0.12 mLs (12 Units total) into the skin at bedtime. 10 mL 0  . multivitamin (RENA-VIT) TABS tablet Take 1 tablet by mouth at bedtime. 30 tablet 3  . oxyCODONE-acetaminophen (PERCOCET/ROXICET) 5-325 MG tablet Take 1  tablet by mouth every 6 (six) hours as needed. 12 tablet 0  . polyethylene glycol (MIRALAX / GLYCOLAX) packet Take 17 g by mouth daily. 30 each 2  . senna (SENOKOT) 8.6 MG TABS tablet Take 2 tablets (17.2 mg total) by mouth daily. 120 each 0  . sodium bicarbonate 650 MG tablet Take 1 tablet (650 mg total) by mouth 3 (three) times daily. 90 tablet 1  . TRUEPLUS LANCETS 28G MISC 1 each by Does not apply route 3 (three) times daily. 100 each 11   No current facility-administered medications on file prior to visit.     Review of Systems Constitutional: Negative for activity change, appetite change and fatigue.  HENT: Negative for congestion, sinus  pressure and sore throat.   Eyes: Negative for visual disturbance.  Respiratory: Negative for cough, chest tightness, shortness of breath and wheezing.   Cardiovascular: Negative for chest pain and palpitations.  Gastrointestinal: Positive for constipation. Negative for abdominal pain and abdominal distention.  Endocrine: Negative for polydipsia.  Genitourinary: Negative for dysuria and frequency.  Musculoskeletal: L arm swelling Skin: Negative for rash.  Neurological: Negative for tremors, light-headedness and numbness.  Hematological: Does not bruise/bleed easily.  Psychiatric/Behavioral: Negative for behavioral problems and agitation.     Objective: Filed Vitals:   07/12/15 1059  BP: 175/81  Pulse: 51  Temp: 98.2 F (36.8 C)  TempSrc: Oral  Height: 5' 7.5" (1.715 m)  Weight: 184 lb (83.462 kg)  SpO2: 99%      Physical Exam Constitutional: She is oriented to person, place, and time. She appears well-developed and well-nourished.  Eyes:  Periorbital edema  Cardiovascular: Normal rate and intact distal pulses.   Murmur (3/6 systolic murmur) heard. Pulmonary/Chest: Effort normal and breath sounds normal. She has no wheezes. She has no rales. She exhibits no tenderness.  Abdominal: Soft. Bowel sounds are normal. She exhibits no distension and no mass. There is no tenderness.  Musculoskeletal: She exhibits edema (edema of left upper extremity; left AVF in place).  Neurological: She is alert and oriented to person, place, and time.  Skin: Skin is warm and dry.  Psychiatric: She has a normal mood and affect.        Assessment & Plan:  1. Chronic constipation Discussed dietary modifications to prevent constipation Advised to use laxatives  2. CKD (chronic kidney disease), stage V (HCC) Status post placement of AV fistula Advised to notify the clinic in the event of developing fevers meanwhile she has been advised to call vascular surgeon to report symptoms. Continue Lasix  as per nephrology recommendations. Advised to keep appointment with [facility] on 07/08/15  3. Anemia, chronic renal failure, stage 5 (HCC) Anemia of chronic disease On [place]  4. Type 2 diabetes mellitus with stage 5 chronic kidney disease not on chronic dialysis, with long-term current use of insulin (HCC) Remain on Lantus 12 units at bedtime; use NovoLog only as per sliding scale. I have explained to her the risk of becoming hypoglycemic especially in the setting of chronic kidney disease and she has been advised to reduce Lantus by 2 units in the event of hypoglycemia Acute blood sugar logs which will be reviewed at her next visit. - Glucose (CBG)  5.Hypertension Blood pressure was in the 500B systolic at her nephrologist visit and so I will make no regimen changes and she will follow-up with PCP for reassessment of BP.

## 2015-07-12 NOTE — Patient Instructions (Signed)
Hypertension Hypertension, commonly called high blood pressure, is when the force of blood pumping through your arteries is too strong. Your arteries are the blood vessels that carry blood from your heart throughout your body. A blood pressure reading consists of a higher number over a lower number, such as 110/72. The higher number (systolic) is the pressure inside your arteries when your heart pumps. The lower number (diastolic) is the pressure inside your arteries when your heart relaxes. Ideally you want your blood pressure below 120/80. Hypertension forces your heart to work harder to pump blood. Your arteries may become narrow or stiff. Having untreated or uncontrolled hypertension can cause heart attack, stroke, kidney disease, and other problems. RISK FACTORS Some risk factors for high blood pressure are controllable. Others are not.  Risk factors you cannot control include:   Race. You may be at higher risk if you are African American.  Age. Risk increases with age.  Gender. Men are at higher risk than women before age 45 years. After age 65, women are at higher risk than men. Risk factors you can control include:  Not getting enough exercise or physical activity.  Being overweight.  Getting too much fat, sugar, calories, or salt in your diet.  Drinking too much alcohol. SIGNS AND SYMPTOMS Hypertension does not usually cause signs or symptoms. Extremely high blood pressure (hypertensive crisis) may cause headache, anxiety, shortness of breath, and nosebleed. DIAGNOSIS To check if you have hypertension, your health care provider will measure your blood pressure while you are seated, with your arm held at the level of your heart. It should be measured at least twice using the same arm. Certain conditions can cause a difference in blood pressure between your right and left arms. A blood pressure reading that is higher than normal on one occasion does not mean that you need treatment. If  it is not clear whether you have high blood pressure, you may be asked to return on a different day to have your blood pressure checked again. Or, you may be asked to monitor your blood pressure at home for 1 or more weeks. TREATMENT Treating high blood pressure includes making lifestyle changes and possibly taking medicine. Living a healthy lifestyle can help lower high blood pressure. You may need to change some of your habits. Lifestyle changes may include:  Following the DASH diet. This diet is high in fruits, vegetables, and whole grains. It is low in salt, red meat, and added sugars.  Keep your sodium intake below 2,300 mg per day.  Getting at least 30-45 minutes of aerobic exercise at least 4 times per week.  Losing weight if necessary.  Not smoking.  Limiting alcoholic beverages.  Learning ways to reduce stress. Your health care provider may prescribe medicine if lifestyle changes are not enough to get your blood pressure under control, and if one of the following is true:  You are 18-59 years of age and your systolic blood pressure is above 140.  You are 60 years of age or older, and your systolic blood pressure is above 150.  Your diastolic blood pressure is above 90.  You have diabetes, and your systolic blood pressure is over 140 or your diastolic blood pressure is over 90.  You have kidney disease and your blood pressure is above 140/90.  You have heart disease and your blood pressure is above 140/90. Your personal target blood pressure may vary depending on your medical conditions, your age, and other factors. HOME CARE INSTRUCTIONS    Have your blood pressure rechecked as directed by your health care provider.   Take medicines only as directed by your health care provider. Follow the directions carefully. Blood pressure medicines must be taken as prescribed. The medicine does not work as well when you skip doses. Skipping doses also puts you at risk for  problems.  Do not smoke.   Monitor your blood pressure at home as directed by your health care provider. SEEK MEDICAL CARE IF:   You think you are having a reaction to medicines taken.  You have recurrent headaches or feel dizzy.  You have swelling in your ankles.  You have trouble with your vision. SEEK IMMEDIATE MEDICAL CARE IF:  You develop a severe headache or confusion.  You have unusual weakness, numbness, or feel faint.  You have severe chest or abdominal pain.  You vomit repeatedly.  You have trouble breathing. MAKE SURE YOU:   Understand these instructions.  Will watch your condition.  Will get help right away if you are not doing well or get worse.   This information is not intended to replace advice given to you by your health care provider. Make sure you discuss any questions you have with your health care provider.   Document Released: 12/18/2004 Document Revised: 05/04/2014 Document Reviewed: 10/10/2012 Elsevier Interactive Patient Education 2016 Elsevier Inc.  

## 2015-07-13 ENCOUNTER — Ambulatory Visit (INDEPENDENT_AMBULATORY_CARE_PROVIDER_SITE_OTHER): Payer: Self-pay | Admitting: Family

## 2015-07-13 ENCOUNTER — Encounter: Payer: Self-pay | Admitting: Family

## 2015-07-13 VITALS — BP 165/70 | HR 47 | Temp 97.6°F | Resp 16 | Ht 67.5 in | Wt 182.0 lb

## 2015-07-13 DIAGNOSIS — N186 End stage renal disease: Secondary | ICD-10-CM

## 2015-07-13 DIAGNOSIS — I770 Arteriovenous fistula, acquired: Secondary | ICD-10-CM

## 2015-07-13 NOTE — Patient Instructions (Signed)

## 2015-07-13 NOTE — Progress Notes (Addendum)
    Postoperative Access Visit   History of Present Illness  Crystal Tanner is a 45 y.o. year old female who is s/p Left upper arm basilic vein transposition fistula creation on 06/27/15 by Dr. Arbie CookeyEarly. She presents today after a phone call from the pt yesterday to report increased pain and swelling of left upper arm. Reported she has had blistering around the incisions. Stated 2 blisters have gone down, and one blister is slightly red, pea-sized and located near the middle incision. Stated the incisions remain closed; no redness or drainage. Reported there is more stinging and burning with the incisions, Stated she hasn't elevated her arm in 4-5 days. Now stated the swelling has gotten worse. Denied fever/ chills. Reported was at another MD office yesterday, and advised to call and report symptoms to surgeon's office.  Pt states she elevated her left arm last night for the first time and all of the pain and some of the swelling resolved.  She denies tingling, numbness, pain, or cold sensation in her left forearm/hand. Pt states her nephrologist told her that he hopes she does not need hemodialysis for another couple of months. The blistering has resolved; it appears this might have been from a tape reaction.  Pt states her renal failure was mostly due to uncontrolled hypertension, states her DM is in control. Review of records shows that A1C was 6.9 on 07/12/15, in control. Recent GFR of 7, stage 5 CKD   For VQI Use Only  PRE-ADM LIVING: Home  AMB STATUS: Ambulatory  Physical Examination Filed Vitals:   07/13/15 0930 07/13/15 0933  BP: 166/69 165/70  Pulse: 47   Temp: 97.6 F (36.4 C)   TempSrc: Oral   Resp: 16   Height: 5' 7.5" (1.715 m)   Weight: 182 lb (82.555 kg)   SpO2: 97%    Body mass index is 28.07 kg/(m^2).  LUE: Incisions are healed, skin feels warm and normal, hand grip is 5/5, sensation in digits is intact, palpable thrill, bruit can be auscultated in left  upper arm AVF. Left radial pulse is 2+ palpable.   Medical Decision Making  Crystal Tanner is a 45 y.o. year old female who presents s/p Left upper arm basilic vein transposition fistula creation on 06/27/15. The swelling mostly resolved and pain did resolve after pt started elevating her left arm. The blisters on her arm have resolved and appeared to be from tape. All incisions have healed. No signs of infection. Continue elevation of left arm above her heart for 20-30 minutes several times during the day and overnight.   Follow up with Dr. Arbie CookeyEarly on 07/26/15 as already scheduled.   Thank you for allowing us to participate in this patient's care.  NICKEL, Carma LairSUZANNE L, RN, MSN, FNP-C [facility] Office: [phone]  07/13/2015, 9:48 AM  Clinic MD: Edilia Boickson

## 2015-07-21 ENCOUNTER — Telehealth: Payer: Self-pay

## 2015-07-21 ENCOUNTER — Encounter: Payer: Self-pay | Admitting: Vascular Surgery

## 2015-07-21 NOTE — Telephone Encounter (Signed)
This Case Manager placed call to patient to check on status. Patient indicated she was doing well and the swelling of her left arm at AV fistula site had "gone down." Patient had appointment on 07/13/15 with Charisse MarchSuzanne Nickel, NP at [facility] for swelling of left upper arm.   Patient denied any new health concerns. She indicated she was taking her medications as prescribed and denied any questions about her medications. Reminded patient that she will need to follow-up with Dr. Armen PickupFunches around 08/12/15 so she will need to call clinic around 08/02/15 to schedule appointment for follow-up of hypertension. Patient verbalized understanding and appreciative of call. No additional needs/concerns identified.

## 2015-07-22 MED FILL — SODIUM BICARB 10 GRAIN TAB: 650 | 30 days supply | Qty: 90 | Fill #1

## 2015-07-25 MED FILL — CALCITRIOL 0.25 MCG CAPSULE: 0.25 | 30 days supply | Qty: 15 | Fill #0

## 2015-07-26 ENCOUNTER — Encounter: Payer: Self-pay | Admitting: Vascular Surgery

## 2015-07-26 ENCOUNTER — Ambulatory Visit (INDEPENDENT_AMBULATORY_CARE_PROVIDER_SITE_OTHER): Payer: Self-pay | Admitting: Vascular Surgery

## 2015-07-26 VITALS — BP 146/69 | HR 50 | Temp 98.3°F | Resp 18 | Ht 67.0 in | Wt 165.5 lb

## 2015-07-26 DIAGNOSIS — N189 Chronic kidney disease, unspecified: Secondary | ICD-10-CM

## 2015-07-26 DIAGNOSIS — N184 Chronic kidney disease, stage 4 (severe): Secondary | ICD-10-CM

## 2015-07-26 NOTE — Progress Notes (Signed)
Patient name: Crystal Tanner MRN: [id number] DOB: 04/16/1970 Sex: female  REASON FOR VISIT: Follow-up left upper arm basilic vein transposition fistula placed on 06/27/2015  HPI: Crystal Tanner is a 45 y.o. female here for follow-up. She did have some bruising and swelling at the time of her surgery but this has resolved. She was quite startled at sensing the thrill in her arm. Reassured her that this was normal and expected. She reports that she is still not on dialysis and does not know of any eminent plans for this  Current Outpatient Prescriptions  Medication Sig Dispense Refill  . Blood Glucose Monitoring Suppl (TRUE METRIX METER) w/Device KIT 1 each by Does not apply route 3 (three) times daily. 1 kit 0  . cloNIDine (CATAPRES) 0.3 MG tablet Take 1 tablet (0.3 mg total) by mouth 2 (two) times daily. 60 tablet 5  . diltiazem (CARDIZEM CD) 180 MG 24 hr capsule Take 2 capsules (360 mg total) by mouth daily. 60 capsule 5  . furosemide (LASIX) 40 MG tablet Take 1 tablet (40 mg total) by mouth 2 (two) times daily. (Patient taking differently: Take 80 mg by mouth 2 (two) times daily. ) 60 tablet 1  . glucose blood (FREESTYLE TEST STRIPS) test strip Use as instructed 100 each 12  . glucose blood (TRUE METRIX BLOOD GLUCOSE TEST) test strip Use as instructed 100 each 12  . hydrALAZINE (APRESOLINE) 100 MG tablet Take 1 tablet (100 mg total) by mouth 3 (three) times daily. 90 tablet 5  . insulin aspart (NOVOLOG) 100 UNIT/ML injection Inject into the skin 3 (three) times daily before meals. As per sliding scale    . insulin glargine (LANTUS) 100 UNIT/ML injection Inject 0.12 mLs (12 Units total) into the skin at bedtime. 10 mL 3  . multivitamin (RENA-VIT) TABS tablet Take 1 tablet by mouth at bedtime. 30 tablet 3  . polyethylene glycol (MIRALAX / GLYCOLAX) packet Take 17 g by mouth daily. 30 each 2  . Potassium Gluconate 550 MG TABS Take 1 tablet by mouth daily.    Marland Kitchen  senna (SENOKOT) 8.6 MG TABS tablet Take 2 tablets (17.2 mg total) by mouth daily. 120 each 0  . sodium bicarbonate 650 MG tablet Take 1 tablet (650 mg total) by mouth 3 (three) times daily. 90 tablet 1  . TRUEPLUS LANCETS 28G MISC 1 each by Does not apply route 3 (three) times daily. 100 each 11  . oxyCODONE-acetaminophen (PERCOCET/ROXICET) 5-325 MG tablet Take 1 tablet by mouth every 6 (six) hours as needed. (Patient not taking: Reported on 07/26/2015) 12 tablet 0   No current facility-administered medications for this visit.       PHYSICAL EXAM: Vitals:   07/26/15 1100 07/26/15 1104  BP: (!) 152/66 (!) 146/69  Pulse: (!) 50   Resp: 18   Temp: 98.3 F (36.8 C)   TempSrc: Oral   SpO2: 100%   Weight: 165 lb 8 oz (75.1 kg)   Height: 5' 7"  (1.702 m)     GENERAL: The patient is a well-nourished female, in no acute distress. The vital signs are documented above. Incisions in her left arm all well-healed. Excellent size maturation and excellent thrill in her fistula. No evidence of steal  MEDICAL ISSUES: Stable one month following initial creation of a transplanted position basilic vein fistula left arm. She will continue follow-up with nephrology. We will see her again on an as-needed basis. I feel that she has an excellent chance that this will provide her  satisfactory hemodialysis if she progresses to this.  Rosetta Posner, MD FACS [facility] Tel [phone] Pager [phone]

## 2015-08-02 MED FILL — !LANTUS SOLOSTAR 100UNITS/M: 100 | 25 days supply | Qty: 3 | Fill #1

## 2015-08-02 MED FILL — TRUE METRIX TEST STRIP: 24 days supply | Qty: 100 | Fill #5

## 2015-08-02 MED FILL — ?FUROSEMIDE 80MG TABLET: 80 | 30 days supply | Qty: 60 | Fill #1

## 2015-08-10 MED FILL — hydrALAZINE HCL 100 MG TABS: 100 | 30 days supply | Qty: 90 | Fill #4

## 2015-08-10 MED FILL — cloNIDine HCL 0.3 MG TABS: 0.3 | 30 days supply | Qty: 60 | Fill #4

## 2015-08-10 MED FILL — DILTIAZEM 24HR ER 180 MG CA: 180 | 30 days supply | Qty: 60 | Fill #4

## 2015-08-12 ENCOUNTER — Other Ambulatory Visit: Payer: Self-pay

## 2015-08-15 ENCOUNTER — Telehealth: Payer: Self-pay

## 2015-08-15 ENCOUNTER — Ambulatory Visit: Payer: Medicaid Other | Attending: Family Medicine | Admitting: Family Medicine

## 2015-08-15 ENCOUNTER — Encounter (HOSPITAL_COMMUNITY): Payer: Self-pay | Admitting: *Deleted

## 2015-08-15 ENCOUNTER — Encounter: Payer: Self-pay | Admitting: Family Medicine

## 2015-08-15 VITALS — BP 194/75 | HR 48 | Temp 98.1°F | Ht 67.0 in | Wt 174.8 lb

## 2015-08-15 DIAGNOSIS — N185 Chronic kidney disease, stage 5: Secondary | ICD-10-CM | POA: Diagnosis present

## 2015-08-15 DIAGNOSIS — Z794 Long term (current) use of insulin: Secondary | ICD-10-CM

## 2015-08-15 DIAGNOSIS — E1310 Other specified diabetes mellitus with ketoacidosis without coma: Secondary | ICD-10-CM

## 2015-08-15 DIAGNOSIS — I120 Hypertensive chronic kidney disease with stage 5 chronic kidney disease or end stage renal disease: Secondary | ICD-10-CM | POA: Diagnosis not present

## 2015-08-15 DIAGNOSIS — I10 Essential (primary) hypertension: Secondary | ICD-10-CM

## 2015-08-15 DIAGNOSIS — Z87891 Personal history of nicotine dependence: Secondary | ICD-10-CM | POA: Insufficient documentation

## 2015-08-15 DIAGNOSIS — I150 Renovascular hypertension: Secondary | ICD-10-CM

## 2015-08-15 DIAGNOSIS — E1122 Type 2 diabetes mellitus with diabetic chronic kidney disease: Secondary | ICD-10-CM | POA: Insufficient documentation

## 2015-08-15 LAB — GLUCOSE, POCT (MANUAL RESULT ENTRY): POC GLUCOSE: 138 mg/dL — AB (ref 70–99)

## 2015-08-15 MED ORDER — DEXTROSE 5 % IV SOLN
1.5000 g | INTRAVENOUS | Status: AC
  Administered 2015-08-16: 1.5 g via INTRAVENOUS
  Filled 2015-08-15: qty 1.5

## 2015-08-15 MED ORDER — SODIUM CHLORIDE 0.9 % IV SOLN
INTRAVENOUS | Status: DC
  Administered 2015-08-16: 09:00:00 via INTRAVENOUS

## 2015-08-15 MED ORDER — CLONIDINE HCL 0.3 MG PO TABS: 0.3000 mg | ORAL_TABLET | Freq: Three times a day (TID) | ORAL | 5 refills | Status: DC

## 2015-08-15 MED ORDER — SODIUM BICARBONATE 650 MG PO TABS: 650.0000 mg | ORAL_TABLET | Freq: Three times a day (TID) | ORAL | 1 refills | Status: DC

## 2015-08-15 MED FILL — SODIUM BICARB 10 GRAIN TAB: 650 | 30 days supply | Qty: 90 | Fill #0

## 2015-08-15 NOTE — Progress Notes (Signed)
Patient want a refill on sodium bicarbonate.

## 2015-08-15 NOTE — Assessment & Plan Note (Signed)
Elevated BP Dialysis planned for end of week  Plan: Increase clonidine to TID

## 2015-08-15 NOTE — Patient Instructions (Addendum)
Crystal Tanner was seen today for hypertension.  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  CKD (chronic kidney disease), stage V (HCC) -     sodium bicarbonate 650 MG tablet; Take 1 tablet (650 mg total) by mouth 3 (three) times daily.   Increase clonidine to three times a day Eat a renal diet  I do not have a form in my stack of papers. If it was a form for medical records that is handled directly by the front office staff and not passed on to me. If you are unsure of the type of form please inquire at the social services office.   Keep close f/u with your nephrologist  F/u in 4 weeks for flu shot visit with nurse or flu clinic F/u with me in 3 months for HTN, diabetes  Dr. Armen PickupFunches    Dialysis Diet Dialysis is a treatment that you may undergo if you have significant damage to your kidneys. Dialysis replaces some of the work that the kidneys do. One of the jobs that it takes over is removing wastes, salt, and extra water from your blood. This helps to keep the amount of potassium and other nutrients in your blood at healthy levels. When you need dialysis, it is important to pay careful attention to your diet. Between dialysis sessions, certain nutrients can build up in your blood and cause you to get sick. Vitamins and minerals are an important part of a healthy diet and should not be avoided entirely. However, it is commonly recommended that you limit your intake of potassium, phosphorus, and sodium. It may also be necessary to restrict other nutrients, such as carbohydrates or fat, if you have other health conditions. Your health care provider or dietitian can help you to determine the amount of these  nutrients that is right for you. WHAT IS MY PLAN? Your dietitian will help you to design a meal plan that is specific to your needs. Generally, meal plans include:  Grains, 6-11 servings per day. One serving is equal to 1 slice of bread or  cup of cooked rice or pasta.  Low-potassium vegetables, 2-3 servings per day. One serving is equal to  cup.  Low-potassium fruits, 2-3 servings per day. One serving is equal to  cup.  Meats and other protein sources, 8-11 oz per day.  Dairy,  cup per day. Your dietitian will provide you with specific instructions about the amount of fluids you can have each day. WHAT DO I NEED TO KNOW ABOUT A DIALYSIS DIET?  Limit your intake of potassium. Potassium is found in milk, fruits, and vegetables.  Limit your intake of phosphorus. Phosphorus is found in milk, cheese, beans, nuts, and carbonated beverages. Avoid whole-grain and high-fiber foods because they contain high amounts of phosphorus.  Limit your intake of sodium. Foods that are high in sodium include processed and cured meats, ready-made frozen meals, canned vegetables, and salty snack foods. Do not use salt substitutes because they contain potassium.  If you were instructed to restrict your fluid intake, follow your health care provider's specific instructions. You may be told to:  Write down what you drink and any foods you eat that are  made mostly from water, such as gelatin and soups.  Drink from small cups to help control how much you drink.  Ask your health care provider if you should regularly take an over-the-counter medicine that binds phosphorus, such as antacid products that contain calcium carbonate.  Take vitamin and mineral supplements only as directed by your health care provider.  Eat high-quality proteins, such as meat, poultry, fish, and eggs. Limit low-quality plant-based proteins, such as nuts and beans.  Cut potatoes into small pieces and boil them in unsalted water  before you eat them. This can help to remove some potassium from the potato.  Drain all fluid from cooked vegetables and canned fruits before eating them. WHAT FOODS CAN I EAT? Grains White bread. White rice. Cooked cereal. Unsalted popcorn. Tortillas. Pasta. Vegetables Fresh or frozen broccoli, carrots, and green beans. Cabbage. Cauliflower. Celery. Cucumbers. Eggplant. Radishes. Zucchini. Fruits Apples. Fresh or frozen berries. Fresh or canned pears, peaches, and pineapple. Grapes. Plums. Meats and Other Protein Sources Fresh or frozen beef, pork, chicken, and fish. Eggs. Low-sodium canned tuna or salmon. Dairy Cream cheese. Heavy cream. Ricotta cheese. Beverages Apple cider. Cranberry juice. Grape juice. Lemonade. Black coffee. Condiments Herbs. Spices. Jam and jelly. Honey. Sweets and Desserts Sherbet. Cakes. Cookies. Fats and Oils Olive oil, canola oil, and safflower oil. Other Non-dairy creamer. Non-dairy whipped topping. Homemade broth without salt. The items listed above may not be a complete list of recommended foods or beverages. Contact your dietitian for more options.  WHAT FOODS ARE NOT RECOMMENDED? Grains Whole-grain bread. Whole-grain pasta. High-fiber cereal. Vegetables Potatoes. Beets. Tomatoes. Winter squash and pumpkin. Asparagus. Spinach. Parsnips. Fruits Star fruit. Bananas. Oranges. Kiwi. Nectarines. Prunes. Melon. Dried fruit. Avocado. Meats and Other Protein Sources Canned, smoked, and cured meats. Occupational hygienistackaged luncheon meat. Sardines. Nuts and seeds. Peanut butter. Beans and legumes. Dairy Milk. Buttermilk. Yogurt. Cheese and cottage cheese. Processed cheese spreads. Beverages Orange juice. Prune juice. Carbonated soft drinks. Condiments Salt. Salt substitutes. Soy sauce. Sweets and Desserts Ice cream. Chocolate. Candied nuts. Fats and Oils Butter. Margarine. Other Ready-made frozen meals. Canned soups. The items listed above may not be a complete  list of foods and beverages to avoid. Contact your dietitian for more information.   This information is not intended to replace advice given to you by your health care provider. Make sure you discuss any questions you have with your health care provider.   Document Released: 09/15/2003 Document Revised: 01/08/2014 Document Reviewed: 07/21/2013 Elsevier Interactive Patient Education [employer]2016 Elsevier Inc.

## 2015-08-15 NOTE — Progress Notes (Addendum)
Subjective:  Patient ID: Crystal Tanner, female    DOB: 12/09/1970  Age: 45 y.o. MRN: [id number]  CC: Hypertension   HPI Crystal Tanner has diabetes and HTN, CKD stage V she presents for   1. HTN: compliant with regimen. Swelling has improved with lasix. She denies CP, SOB or dizziness. She is scheduled for temporary dialysis catheter placement tomorrow so she can start dialysis by the end of this week. This is upsetting to her. She has AV fistula that will not be mature until the end of September. She wants to know which diet to eat, diabetic or renal? She is applying for medicaid.   Social History  Substance Use Topics  . Smoking status: Former Smoker    Packs/day: 0.25    Years: 15.00    Quit date: 08/14/2015  . Smokeless tobacco: Never Used  . Alcohol use No     Comment: less than a ppd, "not much"    Outpatient Medications Prior to Visit  Medication Sig Dispense Refill  . Blood Glucose Monitoring Suppl (TRUE METRIX METER) w/Device KIT 1 each by Does not apply route 3 (three) times daily. 1 kit 0  . calcitRIOL (ROCALTROL) 0.25 MCG capsule Take 0.25 mcg by mouth every Monday, Wednesday, and Friday.  5  . cloNIDine (CATAPRES) 0.3 MG tablet Take 1 tablet (0.3 mg total) by mouth 2 (two) times daily. 60 tablet 5  . diltiazem (CARDIZEM CD) 180 MG 24 hr capsule Take 2 capsules (360 mg total) by mouth daily. 60 capsule 5  . furosemide (LASIX) 40 MG tablet Take 1 tablet (40 mg total) by mouth 2 (two) times daily. (Patient taking differently: Take 80 mg by mouth daily. ) 60 tablet 1  . glucose blood (FREESTYLE TEST STRIPS) test strip Use as instructed 100 each 12  . glucose blood (TRUE METRIX BLOOD GLUCOSE TEST) test strip Use as instructed 100 each 12  . hydrALAZINE (APRESOLINE) 100 MG tablet Take 1 tablet (100 mg total) by mouth 3 (three) times daily. 90 tablet 5  . insulin aspart (NOVOLOG) 100 UNIT/ML injection Inject into the skin 3 (three) times daily before meals. As per sliding scale     . insulin glargine (LANTUS) 100 UNIT/ML injection Inject 0.12 mLs (12 Units total) into the skin at bedtime. 10 mL 3  . multivitamin (RENA-VIT) TABS tablet Take 1 tablet by mouth at bedtime. 30 tablet 3  . sodium bicarbonate 650 MG tablet Take 1 tablet (650 mg total) by mouth 3 (three) times daily. 90 tablet 1  . TRUEPLUS LANCETS 28G MISC 1 each by Does not apply route 3 (three) times daily. 100 each 11  . oxyCODONE-acetaminophen (PERCOCET/ROXICET) 5-325 MG tablet Take 1 tablet by mouth every 6 (six) hours as needed. (Patient not taking: Reported on 07/26/2015) 12 tablet 0  . polyethylene glycol (MIRALAX / GLYCOLAX) packet Take 17 g by mouth daily. (Patient not taking: Reported on 08/15/2015) 30 each 2  . senna (SENOKOT) 8.6 MG TABS tablet Take 2 tablets (17.2 mg total) by mouth daily. (Patient not taking: Reported on 08/15/2015) 120 each 0   Facility-Administered Medications Prior to Visit  Medication Dose Route Frequency Provider Last Rate Last Dose  . [START ON 08/16/2015] 0.9 %  sodium chloride infusion   Intravenous Continuous Elam Dutch, MD      . Derrill Memo ON 08/16/2015] cefUROXime (ZINACEF) 1.5 g in dextrose 5 % 50 mL IVPB  1.5 g Intravenous To SS-Surg Elam Dutch, MD  ROS Review of Systems  Constitutional: Negative for chills and fever.  Eyes: Negative for visual disturbance.  Respiratory: Negative for shortness of breath.   Cardiovascular: Positive for leg swelling. Negative for chest pain.  Gastrointestinal: Positive for constipation. Negative for abdominal pain and blood in stool.  Musculoskeletal: Negative for arthralgias and back pain.  Skin: Negative for rash.  Allergic/Immunologic: Negative for immunocompromised state.  Hematological: Negative for adenopathy. Does not bruise/bleed easily.  Psychiatric/Behavioral: Negative for dysphoric mood and suicidal ideas.    Objective:  BP (!) 194/75 (BP Location: Right Arm, Patient Position: Sitting, Cuff Size: Small)    Pulse (!) 48   Temp 98.1 F (36.7 C) (Oral)   Ht 5' 7"  (1.702 m)   Wt 174 lb 12.8 oz (79.3 kg)   LMP 08/05/2015   SpO2 97%   BMI 27.38 kg/m   BP/Weight 08/15/2015 07/26/2015 9/62/2297  Systolic BP 989 211 941  Diastolic BP 75 69 70  Wt. (Lbs) 174.8 165.5 182  BMI 27.38 25.92 28.07   Physical Exam  Constitutional: She is oriented to person, place, and time. She appears well-developed and well-nourished. No distress.  Cardiovascular: Normal rate and regular rhythm.   Murmur heard.  Systolic murmur is present with a grade of 3/6  Pulmonary/Chest: Effort normal and breath sounds normal. No respiratory distress.  Musculoskeletal: She exhibits edema (trace in LE ).       Arms: Neurological: She is alert and oriented to person, place, and time.  Skin: Skin is warm and dry. No rash noted. No erythema.  Psychiatric: She has a normal mood and affect.   Lab Results  Component Value Date   HGBA1C 6.9 07/12/2015   CBG 138   Assessment & Plan:   Jordane was seen today for hypertension.  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  CKD (chronic kidney disease), stage V (HCC) -     sodium bicarbonate 650 MG tablet; Take 1 tablet (650 mg total) by mouth 3 (three) times daily.   No orders of the defined types were placed in this encounter.   Follow-up: No Follow-up on file.   Boykin Nearing MD

## 2015-08-15 NOTE — Telephone Encounter (Signed)
Pt has an appointment today with Dr. Armen PickupFunches pt stated that she received a call stating her medication was ready. I asked pt what medication it was pt stated it was the Sodium Bicarbonate I made pt aware that the script was sent on 06/24/15 and it was by another physician. Pt stated she needed a refill I made pt aware that rx had 1 refill pt then stated she went to the pharmacy and they didn't have anything for her I made pt aware that if she wanted a refill she will need to contact the physician that order the rx pt states she is not worried about it and she just wont take the medication.

## 2015-08-16 ENCOUNTER — Ambulatory Visit (HOSPITAL_COMMUNITY): Payer: Medicaid Other

## 2015-08-16 ENCOUNTER — Encounter (HOSPITAL_COMMUNITY)
Admission: RE | Disposition: A | Discharge status: Home / Self Care | Payer: Self-pay | Source: Ambulatory Visit | Attending: Vascular Surgery

## 2015-08-16 ENCOUNTER — Ambulatory Visit (HOSPITAL_COMMUNITY)
Admission: RE | Admit: 2015-08-16 | Discharge: 2015-08-16 | Disposition: A | Discharge status: Home / Self Care | Payer: Medicaid Other | Source: Ambulatory Visit | Attending: Vascular Surgery | Admitting: Vascular Surgery

## 2015-08-16 ENCOUNTER — Ambulatory Visit (HOSPITAL_COMMUNITY): Payer: Medicaid Other | Admitting: Certified Registered"

## 2015-08-16 ENCOUNTER — Encounter (HOSPITAL_COMMUNITY): Payer: Self-pay | Admitting: *Deleted

## 2015-08-16 DIAGNOSIS — E1122 Type 2 diabetes mellitus with diabetic chronic kidney disease: Secondary | ICD-10-CM | POA: Diagnosis not present

## 2015-08-16 DIAGNOSIS — I120 Hypertensive chronic kidney disease with stage 5 chronic kidney disease or end stage renal disease: Secondary | ICD-10-CM | POA: Diagnosis not present

## 2015-08-16 DIAGNOSIS — Z794 Long term (current) use of insulin: Secondary | ICD-10-CM | POA: Insufficient documentation

## 2015-08-16 DIAGNOSIS — N186 End stage renal disease: Secondary | ICD-10-CM | POA: Diagnosis not present

## 2015-08-16 DIAGNOSIS — Z79899 Other long term (current) drug therapy: Secondary | ICD-10-CM | POA: Diagnosis not present

## 2015-08-16 DIAGNOSIS — Z87891 Personal history of nicotine dependence: Secondary | ICD-10-CM | POA: Diagnosis not present

## 2015-08-16 DIAGNOSIS — N185 Chronic kidney disease, stage 5: Secondary | ICD-10-CM

## 2015-08-16 DIAGNOSIS — Z6827 Body mass index (BMI) 27.0-27.9, adult: Secondary | ICD-10-CM | POA: Diagnosis not present

## 2015-08-16 DIAGNOSIS — Z452 Encounter for adjustment and management of vascular access device: Secondary | ICD-10-CM

## 2015-08-16 DIAGNOSIS — Z419 Encounter for procedure for purposes other than remedying health state, unspecified: Secondary | ICD-10-CM

## 2015-08-16 HISTORY — DX: Reserved for inherently not codable concepts without codable children: IMO0001

## 2015-08-16 HISTORY — DX: Depression, unspecified: F32.A

## 2015-08-16 HISTORY — PX: INSERTION OF DIALYSIS CATHETER: SHX1324

## 2015-08-16 HISTORY — DX: Major depressive disorder, single episode, unspecified: F32.9

## 2015-08-16 LAB — RENAL FUNCTION PANEL
ALBUMIN: 3.3 g/dL — AB (ref 3.5–5.0)
ANION GAP: 11 (ref 5–15)
BUN: 84 mg/dL — ABNORMAL HIGH (ref 6–20)
CALCIUM: 8.9 mg/dL (ref 8.9–10.3)
CO2: 22 mmol/L (ref 22–32)
Chloride: 108 mmol/L (ref 101–111)
Creatinine, Ser: 7.26 mg/dL — ABNORMAL HIGH (ref 0.44–1.00)
GFR calc non Af Amer: 6 mL/min — ABNORMAL LOW (ref >60)
GFR, EST AFRICAN AMERICAN: 7 mL/min — AB (ref >60)
GLUCOSE: 243 mg/dL — AB (ref 65–99)
PHOSPHORUS: 3.8 mg/dL (ref 2.5–4.6)
POTASSIUM: 3.8 mmol/L (ref 3.5–5.1)
SODIUM: 141 mmol/L (ref 135–145)

## 2015-08-16 LAB — I-STAT BETA HCG BLOOD, ED (NOT ORDERABLE): I-stat hCG, quantitative: <5 m[IU]/mL (ref <5)

## 2015-08-16 LAB — GLUCOSE, CAPILLARY
GLUCOSE-CAPILLARY: 522 mg/dL — AB (ref 65–99)
GLUCOSE-CAPILLARY: 304 mg/dL — AB (ref 65–99)
GLUCOSE-CAPILLARY: 237 mg/dL — AB (ref 65–99)
Glucose-Capillary: 412 mg/dL — ABNORMAL HIGH (ref 65–99)
Glucose-Capillary: 354 mg/dL — ABNORMAL HIGH (ref 65–99)
Glucose-Capillary: 182 mg/dL — ABNORMAL HIGH (ref 65–99)
Glucose-Capillary: 157 mg/dL — ABNORMAL HIGH (ref 65–99)

## 2015-08-16 SURGERY — INSERTION OF DIALYSIS CATHETER
Anesthesia: Monitor Anesthesia Care | Laterality: Right

## 2015-08-16 MED ORDER — PROPOFOL 10 MG/ML IV BOLUS
INTRAVENOUS | Status: DC | PRN
Start: 2015-08-16 — End: 2015-08-16
  Administered 2015-08-16: 20 mg via INTRAVENOUS
  Administered 2015-08-16 (×2): 15 mg via INTRAVENOUS

## 2015-08-16 MED ORDER — LIDOCAINE HCL (PF) 1 % IJ SOLN
INTRAMUSCULAR | Status: DC | PRN
  Administered 2015-08-16: 30 mL

## 2015-08-16 MED ORDER — HEPARIN SODIUM (PORCINE) 1000 UNIT/ML IJ SOLN
INTRAMUSCULAR | Status: AC
  Filled 2015-08-16: qty 1

## 2015-08-16 MED ORDER — LIDOCAINE HCL (PF) 1 % IJ SOLN
INTRAMUSCULAR | Status: AC
  Filled 2015-08-16: qty 30

## 2015-08-16 MED ORDER — HEPARIN SODIUM (PORCINE) 1000 UNIT/ML IJ SOLN: INTRAMUSCULAR | Status: DC

## 2015-08-16 MED ORDER — 0.9 % SODIUM CHLORIDE (POUR BTL) OPTIME
TOPICAL | Status: DC | PRN
  Administered 2015-08-16: 1000 mL

## 2015-08-16 MED ORDER — SODIUM CHLORIDE 0.9 % IV SOLN: INTRAVENOUS | Status: DC

## 2015-08-16 MED ORDER — LIDOCAINE HCL (CARDIAC) 20 MG/ML IV SOLN
INTRAVENOUS | Status: DC | PRN
  Administered 2015-08-16: 40 mg via INTRATRACHEAL
  Administered 2015-08-16: 20 mg via INTRATRACHEAL

## 2015-08-16 MED ORDER — PROMETHAZINE HCL 25 MG/ML IJ SOLN
6.2500 mg | Freq: Once | INTRAMUSCULAR | Status: AC
  Administered 2015-08-16: 6.25 mg via INTRAVENOUS

## 2015-08-16 MED ORDER — INSULIN ASPART 100 UNIT/ML ~~LOC~~ SOLN: 0.0000 [IU] | Freq: Three times a day (TID) | SUBCUTANEOUS | Status: DC

## 2015-08-16 MED ORDER — PROPOFOL 500 MG/50ML IV EMUL
INTRAVENOUS | Status: DC | PRN
  Administered 2015-08-16: 50 ug/kg/min via INTRAVENOUS

## 2015-08-16 MED ORDER — OXYCODONE-ACETAMINOPHEN 5-325 MG PO TABS: 1.0000 | ORAL_TABLET | Freq: Four times a day (QID) | ORAL | 0 refills | Status: DC | PRN

## 2015-08-16 MED ORDER — FENTANYL CITRATE (PF) 250 MCG/5ML IJ SOLN
INTRAMUSCULAR | Status: AC
  Filled 2015-08-16: qty 5

## 2015-08-16 MED ORDER — DEXTROSE-NACL 5-0.45 % IV SOLN: INTRAVENOUS | Status: DC

## 2015-08-16 MED ORDER — CHLORHEXIDINE GLUCONATE CLOTH 2 % EX PADS: 6.0000 | MEDICATED_PAD | Freq: Once | CUTANEOUS | Status: DC

## 2015-08-16 MED ORDER — FENTANYL CITRATE (PF) 100 MCG/2ML IJ SOLN
25.0000 ug | INTRAMUSCULAR | Status: DC | PRN
  Administered 2015-08-16: 50 ug via INTRAVENOUS
  Administered 2015-08-16: 25 ug via INTRAVENOUS

## 2015-08-16 MED ORDER — INSULIN ASPART 100 UNIT/ML ~~LOC~~ SOLN
SUBCUTANEOUS | Status: DC | PRN
  Administered 2015-08-16: 10 [IU] via INTRAVENOUS

## 2015-08-16 MED ORDER — HEPARIN SODIUM (PORCINE) 1000 UNIT/ML IJ SOLN
INTRAMUSCULAR | Status: DC | PRN
  Administered 2015-08-16: 1000 [IU]

## 2015-08-16 MED ORDER — MIDAZOLAM HCL 2 MG/2ML IJ SOLN
INTRAMUSCULAR | Status: AC
  Filled 2015-08-16: qty 2

## 2015-08-16 MED ORDER — FENTANYL CITRATE (PF) 100 MCG/2ML IJ SOLN: 25.0000 ug | INTRAMUSCULAR | Status: DC | PRN

## 2015-08-16 MED ORDER — FENTANYL CITRATE (PF) 100 MCG/2ML IJ SOLN
INTRAMUSCULAR | Status: DC | PRN
  Administered 2015-08-16: 25 ug via INTRAVENOUS

## 2015-08-16 MED ORDER — INSULIN GLARGINE 100 UNIT/ML ~~LOC~~ SOLN
12.0000 [IU] | SUBCUTANEOUS | Status: AC
  Administered 2015-08-16: 12 [IU] via SUBCUTANEOUS
  Filled 2015-08-16 (×2): qty 0.12

## 2015-08-16 MED ORDER — EPHEDRINE SULFATE 50 MG/ML IJ SOLN
INTRAMUSCULAR | Status: DC | PRN
  Administered 2015-08-16: 10 mg via INTRAVENOUS

## 2015-08-16 MED ORDER — INSULIN REGULAR BOLUS VIA INFUSION: 0.0000 [IU] | Freq: Three times a day (TID) | INTRAVENOUS | Status: DC

## 2015-08-16 MED ORDER — FENTANYL CITRATE (PF) 100 MCG/2ML IJ SOLN
INTRAMUSCULAR | Status: AC
  Filled 2015-08-16: qty 2

## 2015-08-16 MED ORDER — MIDAZOLAM HCL 5 MG/5ML IJ SOLN
INTRAMUSCULAR | Status: DC | PRN
Start: 2015-08-16 — End: 2015-08-16
  Administered 2015-08-16: 2 mg via INTRAVENOUS

## 2015-08-16 MED ORDER — SODIUM CHLORIDE 0.9 % IV SOLN
INTRAVENOUS | Status: DC
  Administered 2015-08-16 (×2): 10 [IU]/h via INTRAVENOUS
  Filled 2015-08-16: qty 2.5

## 2015-08-16 MED ORDER — SODIUM CHLORIDE 0.9 % IV SOLN
INTRAVENOUS | Status: DC | PRN
  Administered 2015-08-16: 09:00:00

## 2015-08-16 MED ORDER — PROMETHAZINE HCL 25 MG/ML IJ SOLN
INTRAMUSCULAR | Status: AC
  Filled 2015-08-16: qty 1

## 2015-08-16 MED ORDER — DEXTROSE 50 % IV SOLN: 25.0000 mL | INTRAVENOUS | Status: DC | PRN

## 2015-08-16 MED ORDER — IOPAMIDOL (ISOVUE-300) INJECTION 61%
INTRAVENOUS | Status: AC
  Filled 2015-08-16: qty 50

## 2015-08-16 SURGICAL SUPPLY — 36 items
BAG DECANTER FOR FLEXI CONT (MISCELLANEOUS) ×2 IMPLANT
BIOPATCH RED 1 DISK 7.0 (GAUZE/BANDAGES/DRESSINGS) ×2 IMPLANT
CATH PALINDROME RT-P 15FX19CM (CATHETERS) IMPLANT
CATH PALINDROME RT-P 15FX23CM (CATHETERS) ×4 IMPLANT
CATH PALINDROME RT-P 15FX28CM (CATHETERS) IMPLANT
CATH PALINDROME RT-P 15FX55CM (CATHETERS) IMPLANT
CATH STRAIGHT 5FR 65CM (CATHETERS) IMPLANT
CHLORAPREP W/TINT 26ML (MISCELLANEOUS) ×2 IMPLANT
COVER PROBE W GEL 5X96 (DRAPES) ×2 IMPLANT
DECANTER SPIKE VIAL GLASS SM (MISCELLANEOUS) IMPLANT
DRAPE C-ARM 42X72 X-RAY (DRAPES) ×2 IMPLANT
DRAPE CHEST BREAST 15X10 FENES (DRAPES) ×2 IMPLANT
GAUZE SPONGE 2X2 8PLY STRL LF (GAUZE/BANDAGES/DRESSINGS) ×1 IMPLANT
GAUZE SPONGE 4X4 16PLY XRAY LF (GAUZE/BANDAGES/DRESSINGS) ×2 IMPLANT
GLOVE BIO SURGEON STRL SZ7.5 (GLOVE) ×2 IMPLANT
GLOVE SURG SS PI 6.5 STRL IVOR (GLOVE) ×4 IMPLANT
GOWN STRL REUS W/ TWL LRG LVL3 (GOWN DISPOSABLE) ×3 IMPLANT
GOWN STRL REUS W/TWL LRG LVL3 (GOWN DISPOSABLE) ×3
KIT BASIN OR (CUSTOM PROCEDURE TRAY) ×2 IMPLANT
KIT ROOM TURNOVER OR (KITS) ×2 IMPLANT
LIQUID BAND (GAUZE/BANDAGES/DRESSINGS) ×2 IMPLANT
NEEDLE 18GX1X1/2 (RX/OR ONLY) (NEEDLE) ×2 IMPLANT
NEEDLE HYPO 25GX1X1/2 BEV (NEEDLE) ×2 IMPLANT
NS IRRIG 1000ML POUR BTL (IV SOLUTION) ×2 IMPLANT
PACK SURGICAL SETUP 50X90 (CUSTOM PROCEDURE TRAY) ×2 IMPLANT
PAD ARMBOARD 7.5X6 YLW CONV (MISCELLANEOUS) ×4 IMPLANT
SET MICROPUNCTURE 5F STIFF (MISCELLANEOUS) IMPLANT
SPONGE GAUZE 2X2 STER 10/PKG (GAUZE/BANDAGES/DRESSINGS) ×1
SUT ETHILON 3 0 PS 1 (SUTURE) ×2 IMPLANT
SUT VICRYL 4-0 PS2 18IN ABS (SUTURE) ×2 IMPLANT
SYR 20CC LL (SYRINGE) ×4 IMPLANT
SYR 5ML LL (SYRINGE) ×2 IMPLANT
SYR CONTROL 10ML LL (SYRINGE) ×2 IMPLANT
SYRINGE 10CC LL (SYRINGE) ×2 IMPLANT
WATER STERILE IRR 1000ML POUR (IV SOLUTION) ×2 IMPLANT
WIRE AMPLATZ SS-J .035X180CM (WIRE) IMPLANT

## 2015-08-16 NOTE — Progress Notes (Signed)
Spoke with Dr Darrick PennaFields regarding patients plan. Told to contact renal Dr Hyman Hopeswebb if anesthesia felt the patient needed to be admitted. Dr Maple HudsonMoser wants her admitted as her sugars have remained elevated above 500. Sharl MaMarty, [place] with renal notified and will have hospitalist admit patient with hopes to dialyze tomorrow.

## 2015-08-16 NOTE — Anesthesia Preprocedure Evaluation (Addendum)
Anesthesia Evaluation  Patient identified by MRN, date of birth, ID band Patient awake    Reviewed: Allergy & Precautions, NPO status , Patient's Chart, lab work & pertinent test results  History of Anesthesia Complications Negative for: history of anesthetic complications  Airway Mallampati: II  TM Distance: >3 FB Neck ROM: Full    Dental  (+) Teeth Intact   Pulmonary shortness of breath, neg COPD, neg recent URI, former smoker,    breath sounds clear to auscultation       Cardiovascular hypertension, Pt. on medications  Rhythm:Regular     Neuro/Psych PSYCHIATRIC DISORDERS Depression negative neurological ROS     GI/Hepatic Neg liver ROS, GERD  ,  Endo/Other  diabetes, Poorly Controlled, Type 2, Insulin DependentMorbid obesity  Renal/GU ESRF and DialysisRenal disease     Musculoskeletal   Abdominal   Peds  Hematology  (+) anemia ,   Anesthesia Other Findings BG in 500s,   Reproductive/Obstetrics                           Anesthesia Physical Anesthesia Plan  ASA: IV  Anesthesia Plan: MAC   Post-op Pain Management:    Induction:   Airway Management Planned: Natural Airway, Nasal Cannula and Simple Face Mask  Additional Equipment: None  Intra-op Plan:   Post-operative Plan:   Informed Consent: I have reviewed the patients History and Physical, chart, labs and discussed the procedure including the risks, benefits and alternatives for the proposed anesthesia with the patient or authorized representative who has indicated his/her understanding and acceptance.   Dental advisory given  Plan Discussed with: CRNA and Surgeon  Anesthesia Plan Comments:         Anesthesia Quick Evaluation

## 2015-08-16 NOTE — Anesthesia Procedure Notes (Signed)
Performed by: WHITE, Dov Dill TENA Deyton Ellenbecker       

## 2015-08-16 NOTE — Progress Notes (Signed)
Dr. Maple HudsonMoser aware of CBG and Blood pressure. No further orders given.

## 2015-08-16 NOTE — Progress Notes (Signed)
Spoke with Dr Hyman HopesWebb. Diabetic RN to come see patient in PACU. Will obtain renal panel and increase insulin drip to 12units/hour with CBG recheck at 1315. Goal is to discharge patient to home, will continue to follow up.

## 2015-08-16 NOTE — Transfer of Care (Signed)
Immediate Anesthesia Transfer of Care Note  Patient: Crystal Tanner  Procedure(s) Performed: Procedure(s): INSERTION OF DIALYSIS CATHETER - RIGHT INTERNAL JUGULAR PLACEMENT (Right)  Patient Location: PACU  Anesthesia Type:MAC  Level of Consciousness: awake, alert , oriented and patient cooperative  Airway & Oxygen Therapy: Patient Spontanous Breathing and Patient connected to nasal cannula oxygen  Post-op Assessment: Report given to RN and Post -op Vital signs reviewed and stable  Post vital signs: Reviewed and stable  Last Vitals:  Vitals:   08/16/15 0848 08/16/15 1000  BP:  (!) 160/70  Pulse: (!) 52 71  Resp: 18 17  Temp: 37 C 36.7 C    Last Pain:  Vitals:   08/16/15 0848  TempSrc: Oral      Patients Stated Pain Goal: 1 (08/16/15 [id number])  Complications: No apparent anesthesia complications

## 2015-08-16 NOTE — Interval H&P Note (Signed)
History and Physical Interval Note:  08/16/2015 8:32 AM  Crystal Tanner  has presented today for surgery, with the diagnosis of Stage V Chronic Kidney Disease N18.5  The various methods of treatment have been discussed with the patient and family. After consideration of risks, benefits and other options for treatment, the patient has consented to  Procedure(s): INSERTION OF DIALYSIS CATHETER (N/A) as a surgical intervention .  The patient's history has been reviewed, patient examined, no change in status, stable for surgery.  I have reviewed the patient's chart and labs.  Questions were answered to the patient's satisfaction.     Pt fistula not quite ready for use but she now needs hemodialysis.  Will place catheter today.  Hyperglycemia may need admission post op to medical service for control of this.  Fabienne Brunsharles Fields, MD [facility] [place] Office: [phone] Pager: [phone]   Fabienne BrunsFields, Charles

## 2015-08-16 NOTE — Progress Notes (Signed)
Notified Dr Maple HudsonMoser regarding CBG of 412 despite initiation of glucostabilizer. Ordered a bolus dose of 10units via infusion and then a rate of 8units/hr continous. Will recheck at 1140

## 2015-08-16 NOTE — H&P (View-Only) (Signed)
Patient name: Crystal Tanner MRN: [id number] DOB: 1970-08-19 Sex: female  REASON FOR VISIT: Follow-up left upper arm basilic vein transposition fistula placed on 06/27/2015  HPI: Crystal Tanner is a 45 y.o. female here for follow-up. She did have some bruising and swelling at the time of her surgery but this has resolved. She was quite startled at sensing the thrill in her arm. Reassured her that this was normal and expected. She reports that she is still not on dialysis and does not know of any eminent plans for this  Current Outpatient Prescriptions  Medication Sig Dispense Refill  . Blood Glucose Monitoring Suppl (TRUE METRIX METER) w/Device KIT 1 each by Does not apply route 3 (three) times daily. 1 kit 0  . cloNIDine (CATAPRES) 0.3 MG tablet Take 1 tablet (0.3 mg total) by mouth 2 (two) times daily. 60 tablet 5  . diltiazem (CARDIZEM CD) 180 MG 24 hr capsule Take 2 capsules (360 mg total) by mouth daily. 60 capsule 5  . furosemide (LASIX) 40 MG tablet Take 1 tablet (40 mg total) by mouth 2 (two) times daily. (Patient taking differently: Take 80 mg by mouth 2 (two) times daily. ) 60 tablet 1  . glucose blood (FREESTYLE TEST STRIPS) test strip Use as instructed 100 each 12  . glucose blood (TRUE METRIX BLOOD GLUCOSE TEST) test strip Use as instructed 100 each 12  . hydrALAZINE (APRESOLINE) 100 MG tablet Take 1 tablet (100 mg total) by mouth 3 (three) times daily. 90 tablet 5  . insulin aspart (NOVOLOG) 100 UNIT/ML injection Inject into the skin 3 (three) times daily before meals. As per sliding scale    . insulin glargine (LANTUS) 100 UNIT/ML injection Inject 0.12 mLs (12 Units total) into the skin at bedtime. 10 mL 3  . multivitamin (RENA-VIT) TABS tablet Take 1 tablet by mouth at bedtime. 30 tablet 3  . polyethylene glycol (MIRALAX / GLYCOLAX) packet Take 17 g by mouth daily. 30 each 2  . Potassium Gluconate 550 MG TABS Take 1 tablet by mouth daily.    Marland Kitchen  senna (SENOKOT) 8.6 MG TABS tablet Take 2 tablets (17.2 mg total) by mouth daily. 120 each 0  . sodium bicarbonate 650 MG tablet Take 1 tablet (650 mg total) by mouth 3 (three) times daily. 90 tablet 1  . TRUEPLUS LANCETS 28G MISC 1 each by Does not apply route 3 (three) times daily. 100 each 11  . oxyCODONE-acetaminophen (PERCOCET/ROXICET) 5-325 MG tablet Take 1 tablet by mouth every 6 (six) hours as needed. (Patient not taking: Reported on 07/26/2015) 12 tablet 0   No current facility-administered medications for this visit.       PHYSICAL EXAM: Vitals:   07/26/15 1100 07/26/15 1104  BP: (!) 152/66 (!) 146/69  Pulse: (!) 50   Resp: 18   Temp: 98.3 F (36.8 C)   TempSrc: Oral   SpO2: 100%   Weight: 165 lb 8 oz (75.1 kg)   Height: 5' 7"  (1.702 m)     GENERAL: The patient is a well-nourished female, in no acute distress. The vital signs are documented above. Incisions in her left arm all well-healed. Excellent size maturation and excellent thrill in her fistula. No evidence of steal  MEDICAL ISSUES: Stable one month following initial creation of a transplanted position basilic vein fistula left arm. She will continue follow-up with nephrology. We will see her again on an as-needed basis. I feel that she has an excellent chance that this will provide her  satisfactory hemodialysis if she progresses to this.  Rosetta Posner, MD FACS [facility] Tel [phone] Pager [phone]

## 2015-08-16 NOTE — Op Note (Signed)
Procedure: Ultrasound-guided insertion of Diatek catheter  Preoperative diagnosis: End-stage renal disease  Postoperative diagnosis: Same  Anesthesia: Local with IV sedation  Operative findings: 23 cm Palindrome catheter right internal jugular vein  Operative details: After obtaining informed consent, the patient was taken to the operating room. The patient was placed in supine position on the operating room table. After adequate sedation the patient's entire neck and chest were prepped and draped in usual sterile fashion. The patient was placed in Trendelenburg position. Ultrasound was used to identify the patient's right internal jugular vein. This had normal compressibility and respiratory variation. Local anesthesia was infiltrated over the right jugular vein.  Using ultrasound guidance, the right internal jugular vein was successfully cannulated.  A 0.035 J-tipped guidewire was threaded into the right internal jugular vein and into the superior vena cava followed by the inferior vena cava under fluoroscopic guidance.   Next sequential 12 and 14 dilators were placed over the guidewire into the right atrium.  A 16 French dilator with a peel-away sheath was then placed over the guidewire into the right atrium.   The guidewire and dilator were removed. A 23 cm Palindrome catheter was then placed through the peel away sheath into the right atrium.  The catheter was then tunneled subcutaneously, cut to length, and the hub attached. The catheter was noted to flush and draw easily. The catheter was inspected under fluoroscopy and found with its tip to be in the right atrium without any kinks throughout its course. The catheter was sutured to the skin with nylon sutures. The neck insertion site was closed with Vicryl stitch. The catheter was then loaded with concentrated Heparin solution. A dry sterile dressing was applied.  The patient tolerated procedure well and there were no complications. Instrument  sponge and needle counts correct in the case. The patient was taken to the recovery room in stable condition. Chest x-ray will be obtained in the recovery room.  Fabienne Brunsharles Daxtin Leiker, MD [facility] [place] Office: [phone] Pager: [phone]

## 2015-08-16 NOTE — Progress Notes (Signed)
Dr Hyman HopesWebb with renal at bedside to see patient. Seems to think that if we can get her blood sugars to be 200 or under it will not be necessary for her to be admitted. He contacted her dialysis center regarding treatment Wednesday or Thursday and they will contact patient. Per Dr Hyman HopesWebb we can attempt to give food and liquid PO after sugar of 200 reached and if tolerated discharge to home.

## 2015-08-16 NOTE — Progress Notes (Signed)
Dr. Hyman HopesWebb paged the diabetes coordinator and spoke with him about the basal insulin being restarted following surgery.   Recommended continuing the IV insulin drip and giving the home dose of Lantus 12 units now and continuing drip for at least another hour after giving the Lantus.  Remind patient to hold the Lantus  tonight and then take tomorrow as normally taken.   Continue the Novolog correction scale TID at home per sliding scale. Smith MinceKendra Cinde Ebert RN BSN CDE

## 2015-08-17 ENCOUNTER — Encounter (HOSPITAL_COMMUNITY): Payer: Self-pay | Admitting: Vascular Surgery

## 2015-08-17 LAB — POCT I-STAT 4, (NA,K, GLUC, HGB,HCT)
GLUCOSE: 513 mg/dL — AB (ref 65–99)
HCT: 31 % — ABNORMAL LOW (ref 36.0–46.0)
Hemoglobin: 10.5 g/dL — ABNORMAL LOW (ref 12.0–15.0)
POTASSIUM: 5.1 mmol/L (ref 3.5–5.1)
Sodium: 140 mmol/L (ref 135–145)

## 2015-08-18 NOTE — Anesthesia Postprocedure Evaluation (Signed)
Anesthesia Post Note  Patient: Crystal Tanner  Procedure(s) Performed: Procedure(s) (LRB): INSERTION OF DIALYSIS CATHETER - RIGHT INTERNAL JUGULAR PLACEMENT (Right)  Patient location during evaluation: PACU Anesthesia Type: MAC Level of consciousness: awake Pain management: pain level controlled Vital Signs Assessment: post-procedure vital signs reviewed and stable Respiratory status: spontaneous breathing Cardiovascular status: stable Anesthetic complications: no    Last Vitals:  Vitals:   08/16/15 1514 08/16/15 1521  BP: (!) 164/68 (!) 181/71  Pulse: (!) 53 (!) 58  Resp: (!) 5 10  Temp: 36.4 C 36.4 C    Last Pain:  Vitals:   08/16/15 1514  TempSrc:   PainSc: 0-No pain                 Nic Lampe

## 2015-08-26 ENCOUNTER — Other Ambulatory Visit: Payer: Self-pay | Admitting: *Deleted

## 2015-08-26 DIAGNOSIS — R739 Hyperglycemia, unspecified: Secondary | ICD-10-CM

## 2015-08-29 ENCOUNTER — Other Ambulatory Visit: Payer: Self-pay

## 2015-08-29 MED ORDER — INSULIN GLARGINE 100 UNIT/ML ~~LOC~~ SOLN: 12.0000 [IU] | Freq: Every day | SUBCUTANEOUS | 3 refills | Status: DC

## 2015-08-29 NOTE — Telephone Encounter (Signed)
Printed script for insulin glargine for pass.

## 2015-08-31 MED FILL — hydrALAZINE HCL 100 MG TABS: 100 | 30 days supply | Qty: 90 | Fill #5

## 2015-08-31 MED FILL — !LANTUS SOLOSTAR 100UNITS/M: 100 | 25 days supply | Qty: 3 | Fill #2

## 2015-08-31 MED FILL — DILTIAZEM 24HR ER 180 MG CA: 180 | 30 days supply | Qty: 60 | Fill #5

## 2015-08-31 MED FILL — cloNIDine HCL 0.3 MG TABS: 0.3 | 30 days supply | Qty: 60 | Fill #5

## 2015-08-31 MED FILL — TRUE METRIX TEST STRIP: 24 days supply | Qty: 100 | Fill #6

## 2015-09-01 ENCOUNTER — Telehealth: Payer: Self-pay | Admitting: Vascular Surgery

## 2015-09-01 NOTE — Telephone Encounter (Signed)
I spoke w/ the patient and gave her the appt for Dr.Funches on 09/15/15 at 12 noon. She knows to arrive at 11:45am.  This was an appt requested by Dr.Fields regarding her hyperglycemia episode during the recent procedure he performed on 08/16/15/ awt

## 2015-09-15 ENCOUNTER — Ambulatory Visit: Payer: Self-pay | Admitting: Family Medicine

## 2015-09-27 ENCOUNTER — Other Ambulatory Visit: Payer: Self-pay | Admitting: Family Medicine

## 2015-09-27 DIAGNOSIS — I10 Essential (primary) hypertension: Secondary | ICD-10-CM

## 2015-09-27 MED FILL — TRUE METRIX TEST STRIP: 24 days supply | Qty: 100 | Fill #7

## 2015-09-27 MED FILL — LANTUS SOLOSTAR 100 UNITS/M: 100 | 25 days supply | Qty: 3 | Fill #3

## 2015-09-27 MED FILL — cloNIDine HCL 0.3 MG TABS: 0.3 | 30 days supply | Qty: 90 | Fill #0

## 2015-09-27 MED FILL — dilTIAZem CD 180 MG CP24: 180 | 30 days supply | Qty: 60 | Fill #0 | Status: TO

## 2015-10-02 ENCOUNTER — Emergency Department (HOSPITAL_COMMUNITY)
Admission: EM | Admit: 2015-10-02 | Discharge: 2015-10-02 | Disposition: A | Discharge status: Home / Self Care | Payer: Medicaid Other | Attending: Emergency Medicine | Admitting: Emergency Medicine

## 2015-10-02 ENCOUNTER — Encounter (HOSPITAL_COMMUNITY): Payer: Self-pay | Admitting: Emergency Medicine

## 2015-10-02 ENCOUNTER — Emergency Department (HOSPITAL_COMMUNITY): Payer: Medicaid Other

## 2015-10-02 DIAGNOSIS — N185 Chronic kidney disease, stage 5: Secondary | ICD-10-CM | POA: Insufficient documentation

## 2015-10-02 DIAGNOSIS — Z79899 Other long term (current) drug therapy: Secondary | ICD-10-CM | POA: Diagnosis not present

## 2015-10-02 DIAGNOSIS — R112 Nausea with vomiting, unspecified: Secondary | ICD-10-CM | POA: Diagnosis not present

## 2015-10-02 DIAGNOSIS — E1122 Type 2 diabetes mellitus with diabetic chronic kidney disease: Secondary | ICD-10-CM | POA: Diagnosis not present

## 2015-10-02 DIAGNOSIS — Z87891 Personal history of nicotine dependence: Secondary | ICD-10-CM | POA: Insufficient documentation

## 2015-10-02 DIAGNOSIS — I120 Hypertensive chronic kidney disease with stage 5 chronic kidney disease or end stage renal disease: Secondary | ICD-10-CM | POA: Insufficient documentation

## 2015-10-02 DIAGNOSIS — Z794 Long term (current) use of insulin: Secondary | ICD-10-CM | POA: Insufficient documentation

## 2015-10-02 DIAGNOSIS — R1110 Vomiting, unspecified: Secondary | ICD-10-CM

## 2015-10-02 LAB — COMPREHENSIVE METABOLIC PANEL
ALK PHOS: 105 U/L (ref 38–126)
ALT: 14 U/L (ref 14–54)
ANION GAP: 19 — AB (ref 5–15)
AST: 16 U/L (ref 15–41)
Albumin: 4 g/dL (ref 3.5–5.0)
BILIRUBIN TOTAL: 0.9 mg/dL (ref 0.3–1.2)
BUN: 48 mg/dL — ABNORMAL HIGH (ref 6–20)
CALCIUM: 8.8 mg/dL — AB (ref 8.9–10.3)
CO2: 21 mmol/L — ABNORMAL LOW (ref 22–32)
Chloride: 98 mmol/L — ABNORMAL LOW (ref 101–111)
Creatinine, Ser: 6.13 mg/dL — ABNORMAL HIGH (ref 0.44–1.00)
GFR, EST AFRICAN AMERICAN: 9 mL/min — AB (ref >60)
GFR, EST NON AFRICAN AMERICAN: 7 mL/min — AB (ref >60)
Glucose, Bld: 338 mg/dL — ABNORMAL HIGH (ref 65–99)
Potassium: 4.7 mmol/L (ref 3.5–5.1)
SODIUM: 138 mmol/L (ref 135–145)
TOTAL PROTEIN: 7.4 g/dL (ref 6.5–8.1)

## 2015-10-02 LAB — URINALYSIS, ROUTINE W REFLEX MICROSCOPIC
BILIRUBIN URINE: NEGATIVE
Glucose, UA: 500 mg/dL — AB
Ketones, ur: NEGATIVE mg/dL
Leukocytes, UA: NEGATIVE
NITRITE: NEGATIVE
SPECIFIC GRAVITY, URINE: 1.015 (ref 1.005–1.030)
pH: 7 (ref 5.0–8.0)

## 2015-10-02 LAB — CBC
HCT: 36.8 % (ref 36.0–46.0)
HEMOGLOBIN: 11.9 g/dL — AB (ref 12.0–15.0)
MCH: 27.7 pg (ref 26.0–34.0)
MCHC: 32.3 g/dL (ref 30.0–36.0)
MCV: 85.6 fL (ref 78.0–100.0)
Platelets: 140 10*3/uL — ABNORMAL LOW (ref 150–400)
RBC: 4.3 MIL/uL (ref 3.87–5.11)
RDW: 16 % — ABNORMAL HIGH (ref 11.5–15.5)
WBC: 9.8 10*3/uL (ref 4.0–10.5)

## 2015-10-02 LAB — URINE MICROSCOPIC-ADD ON

## 2015-10-02 LAB — LIPASE, BLOOD: Lipase: 20 U/L (ref 11–51)

## 2015-10-02 LAB — PREGNANCY, URINE: PREG TEST UR: NEGATIVE

## 2015-10-02 MED ORDER — SODIUM CHLORIDE 0.9 % IV SOLN
1000.0000 mL | INTRAVENOUS | Status: DC
  Administered 2015-10-02: 1000 mL via INTRAVENOUS

## 2015-10-02 MED ORDER — HALOPERIDOL LACTATE 5 MG/ML IJ SOLN
2.0000 mg | Freq: Once | INTRAMUSCULAR | Status: AC
  Administered 2015-10-02: 2 mg via INTRAVENOUS
  Filled 2015-10-02: qty 1

## 2015-10-02 MED ORDER — METOCLOPRAMIDE HCL 5 MG/ML IJ SOLN
10.0000 mg | Freq: Once | INTRAMUSCULAR | Status: AC
  Administered 2015-10-02: 10 mg via INTRAVENOUS
  Filled 2015-10-02: qty 2

## 2015-10-02 MED ORDER — ONDANSETRON 8 MG PO TBDP
8.0000 mg | ORAL_TABLET | Freq: Three times a day (TID) | ORAL | 0 refills | Status: DC | PRN
Start: 2015-10-02 — End: 2015-10-23

## 2015-10-02 MED ORDER — LORAZEPAM 2 MG/ML IJ SOLN
1.0000 mg | Freq: Once | INTRAMUSCULAR | Status: AC
  Administered 2015-10-02: 1 mg via INTRAVENOUS
  Filled 2015-10-02: qty 1

## 2015-10-02 MED ORDER — ONDANSETRON HCL 4 MG/2ML IJ SOLN
4.0000 mg | Freq: Once | INTRAMUSCULAR | Status: AC | PRN
  Administered 2015-10-02: 4 mg via INTRAVENOUS
  Filled 2015-10-02: qty 2

## 2015-10-02 MED ORDER — SODIUM CHLORIDE 0.9 % IV SOLN
1000.0000 mL | Freq: Once | INTRAVENOUS | Status: AC
  Administered 2015-10-02: 1000 mL via INTRAVENOUS

## 2015-10-02 MED ORDER — METOCLOPRAMIDE HCL 10 MG PO TABS: 10.0000 mg | ORAL_TABLET | Freq: Three times a day (TID) | ORAL | 0 refills | Status: DC | PRN

## 2015-10-02 NOTE — ED Notes (Signed)
Pt's vomiting continues.

## 2015-10-02 NOTE — ED Notes (Signed)
Patient transported to X-ray 

## 2015-10-02 NOTE — ED Provider Notes (Signed)
[facility] DEPT Provider Note   CSN: [id number] Arrival date & time: 10/02/15  1022     History   Chief Complaint Chief Complaint  Patient presents with  . Emesis    HPI Crystal Tanner is a 45 y.o. female.  HPI Patient reports she developed nausea vomiting diarrhea this morning.  She's continued having nausea vomiting.  No recent sick contacts.  She is a dialysis patient dialyzes Monday Wednesday Friday.  She has a normal run of dialysis on Friday.  She denies a history of bowel obstruction.  No chest pain shortness of breath.  Denies fevers and chills.  No back pain or flank pain.  Symptoms are moderate in severity.   Past Medical History:  Diagnosis Date  . Acute kidney injury ([place])   . Anemia   . Chronic kidney disease   . Constipation   . Depression   . Diabetes mellitus without complication ([place]) 3235   Type II  . Gastritis   . GERD (gastroesophageal reflux disease)   . Heart murmur    as a child- doesnt remember having echo.  . Hemorrhoid   . Hypertension   . Renal cyst, left   . Shortness of breath dyspnea    with exertion    Patient Active Problem List   Diagnosis Date Noted  . Secondary hyperparathyroidism of renal origin ([place]) 06/27/2015  . Acute on chronic kidney failure ([place]) 06/21/2015  . Nausea with vomiting 06/21/2015  . Hypokalemia 06/21/2015  . Type 2 diabetes mellitus ([place]) 06/21/2015  . Constipation 04/28/2015  . Ileus ([place]) 04/28/2015  . Emesis   . Hypoglycemia 04/05/2015  . Nausea & vomiting 02/19/2015  . Dysmenorrhea 01/07/2015  . Metabolic acidosis 57/32/2025  . Chronic constipation 10/01/2014  . Current smoker 10/01/2014  . Gastritis 09/08/2014  . CKD (chronic kidney disease), stage V ([place]) 09/08/2014  . AKI (acute kidney injury) ([place]) 09/08/2014  . 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 Anemia, chronic renal failure 09/08/2014    Past Surgical History:  Procedure Laterality Date  . ABSCESS DRAINAGE     Laparoscopic,  abdominal  . ABSCESS DRAINAGE Left    arm  . BASCILIC VEIN TRANSPOSITION Left 06/27/2015   Procedure: FIRST STAGE BASILIC VEIN TRANSPOSITION;  Surgeon: Rosetta Posner, MD;  Location: [facility];  Service: Vascular;  Laterality: Left;  . INSERTION OF DIALYSIS CATHETER Right 08/16/2015   Procedure: INSERTION OF DIALYSIS CATHETER - RIGHT INTERNAL JUGULAR PLACEMENT;  Surgeon: Elam Dutch, MD;  Location: [facility];  Service: Vascular;  Laterality: Right;  . MULTIPLE TOOTH EXTRACTIONS      OB History    Gravida Para Term Preterm AB Living   0 0 0 0 0 0   SAB TAB Ectopic Multiple Live Births   0 0 0 0         Home Medications    Prior to Admission medications   Medication Sig Start Date End Date Taking? Authorizing Provider  Blood Glucose Monitoring Suppl (TRUE METRIX METER) w/Device KIT 1 each by Does not apply route 3 (three) times daily. 01/07/15   Boykin Nearing, MD  calcitRIOL (ROCALTROL) 0.25 MCG capsule Take 0.25 mcg by mouth every Monday, Wednesday, and Friday. 07/25/15   Historical Provider, MD  cloNIDine (CATAPRES) 0.3 MG tablet Take 1 tablet (0.3 mg total) by mouth 3 (three) times daily. 08/15/15   Josalyn Funches, MD  diltiazem (CARDIZEM CD) 180 MG 24 hr capsule TAKE 2 CAPSULES BY MOUTH DAILY. 09/27/15   Boykin Nearing, MD  furosemide (LASIX) 40 MG tablet Take 1 tablet (40 mg total) by mouth 2 (two) times daily. Patient taking differently: Take 80 mg by mouth daily.  06/24/15   Bonnell Public, MD  glucose blood (FREESTYLE TEST STRIPS) test strip Use as instructed 01/07/15   Boykin Nearing, MD  glucose blood (TRUE METRIX BLOOD GLUCOSE TEST) test strip Use as instructed 01/07/15   Josalyn Funches, MD  hydrALAZINE (APRESOLINE) 100 MG tablet TAKE 1 TABLET BY MOUTH 3 TIMES DAILY. 09/27/15   Josalyn Funches, MD  insulin aspart (NOVOLOG) 100 UNIT/ML injection Inject into the skin 3 (three) times daily before meals. As per sliding scale    Historical Provider, MD  insulin glargine (LANTUS) 100  UNIT/ML injection Inject 0.12 mLs (12 Units total) into the skin at bedtime. 08/29/15   Boykin Nearing, MD  metoCLOPramide (REGLAN) 10 MG tablet Take 1 tablet (10 mg total) by mouth every 8 (eight) hours as needed for nausea or vomiting. 10/02/15   Jola Schmidt, MD  multivitamin (RENA-VIT) TABS tablet Take 1 tablet by mouth at bedtime. 06/24/15   Bonnell Public, MD  ondansetron (ZOFRAN ODT) 8 MG disintegrating tablet Take 1 tablet (8 mg total) by mouth every 8 (eight) hours as needed for nausea or vomiting. 10/02/15   Jola Schmidt, MD  oxyCODONE-acetaminophen (PERCOCET/ROXICET) 5-325 MG tablet Take 1-2 tablets by mouth every 6 (six) hours as needed for severe pain. 08/16/15   Elam Dutch, MD  sodium bicarbonate 650 MG tablet Take 1 tablet (650 mg total) by mouth 3 (three) times daily. 08/15/15   Josalyn Funches, MD  TRUEPLUS LANCETS 28G MISC 1 each by Does not apply route 3 (three) times daily. 01/07/15   Boykin Nearing, MD    Family History Family History  Problem Relation Age of Onset  . Hypertension Mother   . Diabetes Mother     Social History Social History  Substance Use Topics  . Smoking status: Former Smoker    Packs/day: 0.25    Years: 15.00    Quit date: 08/14/2015  . Smokeless tobacco: Never Used  . Alcohol use No     Comment: less than a ppd, "not much"     Allergies   No known allergies   Review of Systems Review of Systems  All other systems reviewed and are negative.    Physical Exam Updated Vital Signs BP 192/67   Pulse 76   Temp 97.9 F (36.6 C) (Oral)   Resp 18   LMP 09/29/2015   SpO2 99%   Physical Exam  Constitutional: She is oriented to person, place, and time. She appears well-developed and well-nourished. No distress.  HENT:  Head: Normocephalic and atraumatic.  Eyes: EOM are normal.  Neck: Normal range of motion.  Cardiovascular: Normal rate, regular rhythm and normal heart sounds.   Pulmonary/Chest: Effort normal and breath sounds  normal.  Abdominal: Soft. She exhibits no distension. There is no tenderness.  Musculoskeletal: Normal range of motion.  Neurological: She is alert and oriented to person, place, and time.  Skin: Skin is warm and dry.  Psychiatric: She has a normal mood and affect. Judgment normal.  Nursing note and vitals reviewed.    ED Treatments / Results  Labs (all labs ordered are listed, but only abnormal results are displayed) Labs Reviewed  COMPREHENSIVE METABOLIC PANEL - Abnormal; Notable for the following:       Result Value   Chloride 98 (*)    CO2 21 (*)  Glucose, Bld 338 (*)    BUN 48 (*)    Creatinine, Ser 6.13 (*)    Calcium 8.8 (*)    GFR calc non Af Amer 7 (*)    GFR calc Af Amer 9 (*)    Anion gap 19 (*)    All other components within normal limits  CBC - Abnormal; Notable for the following:    Hemoglobin 11.9 (*)    RDW 16.0 (*)    Platelets 140 (*)    All other components within normal limits  URINALYSIS, ROUTINE W REFLEX MICROSCOPIC (NOT AT [facility]) - Abnormal; Notable for the following:    Glucose, UA 500 (*)    Hgb urine dipstick SMALL (*)    Protein, ur >300 (*)    All other components within normal limits  URINE MICROSCOPIC-ADD ON - Abnormal; Notable for the following:    Squamous Epithelial / LPF 0-5 (*)    Bacteria, UA FEW (*)    All other components within normal limits  LIPASE, BLOOD  PREGNANCY, URINE    EKG  EKG Interpretation None       Radiology Dg Abd 2 Views  Result Date: 10/02/2015 CLINICAL DATA:  45 year old female with history of nausea and vomiting this morning. EXAM: ABDOMEN - 2 VIEW COMPARISON:  Abdominal radiograph 06/20/2015. FINDINGS: Gas and stool are seen scattered throughout the colon extending to the level of the distal rectum. No pathologic distension of small bowel is noted. No gross evidence of pneumoperitoneum. Numerous vascular calcifications of the visualized vasculature, including the abdominal aorta. IMPRESSION: 1.   Nonobstructive bowel gas pattern. 2. No pneumoperitoneum. 3. Aortic atherosclerosis. Electronically Signed   By: Vinnie Langton M.D.   On: 10/02/2015 15:16    Procedures Procedures (including critical care time)  Medications Ordered in ED Medications  0.9 %  sodium chloride infusion (0 mLs Intravenous Stopped 10/02/15 1335)    Followed by  0.9 %  sodium chloride infusion (1,000 mLs Intravenous New Bag/Given 10/02/15 1335)  ondansetron (ZOFRAN) injection 4 mg (4 mg Intravenous Given 10/02/15 1120)  metoCLOPramide (REGLAN) injection 10 mg (10 mg Intravenous Given 10/02/15 1216)  LORazepam (ATIVAN) injection 1 mg (1 mg Intravenous Given 10/02/15 1215)  haloperidol lactate (HALDOL) injection 2 mg (2 mg Intravenous Given 10/02/15 1458)     Initial Impression / Assessment and Plan / ED Course  I have reviewed the triage vital signs and the nursing notes.  Pertinent labs & imaging results that were available during my care of the patient were reviewed by me and considered in my medical decision making (see chart for details).  Clinical Course    5:20 PM Patient feels much better at this time.  Doubt DKA.  Bicarbonate is 21.  Mild anion gap secondary to hypochloremia.  IV fluids given.  Feels better.  Tolerating fluids.  Discharge home in good condition.  Patient understands return to the ER for new or worsening symptoms   Final Clinical Impressions(s) / ED Diagnoses   Final diagnoses:  Vomiting  Nausea and vomiting, intractability of vomiting not specified, unspecified vomiting type    New Prescriptions New Prescriptions   METOCLOPRAMIDE (REGLAN) 10 MG TABLET    Take 1 tablet (10 mg total) by mouth every 8 (eight) hours as needed for nausea or vomiting.   ONDANSETRON (ZOFRAN ODT) 8 MG DISINTEGRATING TABLET    Take 1 tablet (8 mg total) by mouth every 8 (eight) hours as needed for nausea or vomiting.     Lennette Bihari  Venora Maples, MD 10/02/15 1721

## 2015-10-02 NOTE — ED Triage Notes (Addendum)
Pt began vomiting this morning. Denies pain. Pt received Zofran 4mg  IM by EMS.

## 2015-10-02 NOTE — ED Notes (Signed)
Unsuccessful iv attempts x 2.

## 2015-10-02 NOTE — ED Notes (Signed)
Bed: [id number] Expected date: 10/02/15 Expected time: 10:21 AM Means of arrival:  Comments: EMS

## 2015-10-07 ENCOUNTER — Emergency Department (HOSPITAL_COMMUNITY)
Admission: EM | Admit: 2015-10-07 | Discharge: 2015-10-07 | Disposition: A | Discharge status: Home / Self Care | Payer: Medicaid Other | Attending: Emergency Medicine | Admitting: Emergency Medicine

## 2015-10-07 ENCOUNTER — Encounter (HOSPITAL_COMMUNITY): Payer: Self-pay | Admitting: *Deleted

## 2015-10-07 DIAGNOSIS — Z794 Long term (current) use of insulin: Secondary | ICD-10-CM | POA: Insufficient documentation

## 2015-10-07 DIAGNOSIS — E1122 Type 2 diabetes mellitus with diabetic chronic kidney disease: Secondary | ICD-10-CM | POA: Diagnosis not present

## 2015-10-07 DIAGNOSIS — N186 End stage renal disease: Secondary | ICD-10-CM | POA: Insufficient documentation

## 2015-10-07 DIAGNOSIS — Z992 Dependence on renal dialysis: Secondary | ICD-10-CM | POA: Diagnosis not present

## 2015-10-07 DIAGNOSIS — I120 Hypertensive chronic kidney disease with stage 5 chronic kidney disease or end stage renal disease: Secondary | ICD-10-CM | POA: Insufficient documentation

## 2015-10-07 DIAGNOSIS — Z87891 Personal history of nicotine dependence: Secondary | ICD-10-CM | POA: Insufficient documentation

## 2015-10-07 DIAGNOSIS — R112 Nausea with vomiting, unspecified: Secondary | ICD-10-CM | POA: Diagnosis present

## 2015-10-07 LAB — URINALYSIS, ROUTINE W REFLEX MICROSCOPIC
BILIRUBIN URINE: NEGATIVE
Glucose, UA: >1000 mg/dL — AB
Ketones, ur: 15 mg/dL — AB
Leukocytes, UA: NEGATIVE
Nitrite: NEGATIVE
PH: 7.5 (ref 5.0–8.0)
Protein, ur: >300 mg/dL — AB
SPECIFIC GRAVITY, URINE: 1.02 (ref 1.005–1.030)

## 2015-10-07 LAB — URINE MICROSCOPIC-ADD ON

## 2015-10-07 LAB — COMPREHENSIVE METABOLIC PANEL
ALT: 16 U/L (ref 14–54)
AST: 21 U/L (ref 15–41)
Albumin: 3.5 g/dL (ref 3.5–5.0)
Alkaline Phosphatase: 87 U/L (ref 38–126)
Anion gap: 17 — ABNORMAL HIGH (ref 5–15)
BILIRUBIN TOTAL: 0.8 mg/dL (ref 0.3–1.2)
BUN: 46 mg/dL — AB (ref 6–20)
CHLORIDE: 102 mmol/L (ref 101–111)
CO2: 19 mmol/L — AB (ref 22–32)
CREATININE: 6.19 mg/dL — AB (ref 0.44–1.00)
Calcium: 8.9 mg/dL (ref 8.9–10.3)
GFR, EST AFRICAN AMERICAN: 9 mL/min — AB (ref >60)
GFR, EST NON AFRICAN AMERICAN: 7 mL/min — AB (ref >60)
Glucose, Bld: 330 mg/dL — ABNORMAL HIGH (ref 65–99)
POTASSIUM: 4.1 mmol/L (ref 3.5–5.1)
Sodium: 138 mmol/L (ref 135–145)
Total Protein: 6.1 g/dL — ABNORMAL LOW (ref 6.5–8.1)

## 2015-10-07 LAB — CBG MONITORING, ED: GLUCOSE-CAPILLARY: 281 mg/dL — AB (ref 65–99)

## 2015-10-07 LAB — CBC
HCT: 37 % (ref 36.0–46.0)
Hemoglobin: 11.6 g/dL — ABNORMAL LOW (ref 12.0–15.0)
MCH: 28 pg (ref 26.0–34.0)
MCHC: 31.4 g/dL (ref 30.0–36.0)
MCV: 89.4 fL (ref 78.0–100.0)
PLATELETS: 180 10*3/uL (ref 150–400)
RBC: 4.14 MIL/uL (ref 3.87–5.11)
RDW: 16.2 % — AB (ref 11.5–15.5)
WBC: 9 10*3/uL (ref 4.0–10.5)

## 2015-10-07 LAB — I-STAT VENOUS BLOOD GAS, ED
ACID-BASE DEFICIT: 1 mmol/L (ref 0.0–2.0)
Bicarbonate: 22.1 mmol/L (ref 20.0–28.0)
O2 SAT: 98 %
TCO2: 23 mmol/L (ref 0–100)
pCO2, Ven: 32.7 mmHg — ABNORMAL LOW (ref 44.0–60.0)
pH, Ven: 7.437 — ABNORMAL HIGH (ref 7.250–7.430)
pO2, Ven: 104 mmHg — ABNORMAL HIGH (ref 32.0–45.0)

## 2015-10-07 LAB — I-STAT BETA HCG BLOOD, ED (MC, WL, AP ONLY): I-stat hCG, quantitative: <5 m[IU]/mL (ref <5)

## 2015-10-07 LAB — LIPASE, BLOOD: LIPASE: 22 U/L (ref 11–51)

## 2015-10-07 MED ORDER — INSULIN ASPART 100 UNIT/ML ~~LOC~~ SOLN
5.0000 [IU] | Freq: Once | SUBCUTANEOUS | Status: AC
  Administered 2015-10-07: 5 [IU] via INTRAVENOUS
  Filled 2015-10-07: qty 1

## 2015-10-07 MED ORDER — PROMETHAZINE HCL 25 MG RE SUPP: 25.0000 mg | Freq: Four times a day (QID) | RECTAL | 0 refills | Status: DC | PRN

## 2015-10-07 MED ORDER — HALOPERIDOL LACTATE 5 MG/ML IJ SOLN
5.0000 mg | Freq: Once | INTRAMUSCULAR | Status: AC
  Administered 2015-10-07: 5 mg via INTRAVENOUS
  Filled 2015-10-07: qty 1

## 2015-10-07 MED ORDER — ONDANSETRON HCL 4 MG/2ML IJ SOLN
4.0000 mg | Freq: Once | INTRAMUSCULAR | Status: AC | PRN
  Administered 2015-10-07: 4 mg via INTRAVENOUS
  Filled 2015-10-07: qty 2

## 2015-10-07 MED ORDER — HYDRALAZINE HCL 20 MG/ML IJ SOLN
10.0000 mg | Freq: Once | INTRAMUSCULAR | Status: AC
  Administered 2015-10-07: 10 mg via INTRAVENOUS
  Filled 2015-10-07: qty 1

## 2015-10-07 MED ORDER — HYDRALAZINE HCL 20 MG/ML IJ SOLN
5.0000 mg | Freq: Once | INTRAMUSCULAR | Status: AC
  Administered 2015-10-07: 5 mg via INTRAVENOUS
  Filled 2015-10-07: qty 1

## 2015-10-07 MED ORDER — SODIUM CHLORIDE 0.9 % IV BOLUS (SEPSIS): 1000.0000 mL | Freq: Once | INTRAVENOUS | Status: DC

## 2015-10-07 MED ORDER — SODIUM CHLORIDE 0.9 % IV BOLUS (SEPSIS)
500.0000 mL | Freq: Once | INTRAVENOUS | Status: AC
  Administered 2015-10-07: 500 mL via INTRAVENOUS

## 2015-10-07 MED ORDER — METOCLOPRAMIDE HCL 5 MG/ML IJ SOLN
10.0000 mg | Freq: Once | INTRAMUSCULAR | Status: AC
  Administered 2015-10-07: 10 mg via INTRAVENOUS
  Filled 2015-10-07: qty 2

## 2015-10-07 NOTE — ED Notes (Signed)
Pt. Given diet ginger ale prior to d/c, pt. Ambulated with steady gait to lobby with this RN. Pt. Outside waiting for taxi.

## 2015-10-07 NOTE — ED Triage Notes (Addendum)
[facility] since 6 am.  [facility]  CBG 358.  bp 180/80, hr 70, rr 18, 99 RA.  Pt did not complete any dialysis today.  Pt refusing to talk after being told she cannot have ice chips until MD sees her d/t fact she is vomiting.

## 2015-10-07 NOTE — ED Provider Notes (Signed)
[facility]-EMERGENCY DEPT Provider Note   CSN: [id number] Arrival date & time: 10/07/15  1241  History   Chief Complaint Chief Complaint  Patient presents with  . Hyperglycemia  . Emesis    HPI Crystal Tanner is a 45 y.o. female.  HPI  45 y.o. female with a hx of DM2, CKD on Dialysis, presents to the Emergency Department today complaining of N/V. Notes occurring this AM around 0600. No abd pain. States she was at dialysis and told her to come to ED for evaluation. Did not begin dialysis treatment today. She is Monday, Wednesday, Friday. No CP/SOB/ABD pain. No diarrhea. No sick contacts. Regular BMs. No fevers. No pain currently. No other symptoms noted.   Past Medical History:  Diagnosis Date  . Acute kidney injury ([facility])   . Anemia   . Chronic kidney disease   . Constipation   . Depression   . Diabetes mellitus without complication ([facility]) 1991   Type II  . Gastritis   . GERD (gastroesophageal reflux disease)   . Heart murmur    as a child- doesnt remember having echo.  . Hemorrhoid   . Hypertension   . Renal cyst, left   . Shortness of breath dyspnea    with exertion    Patient Active Problem List   Diagnosis Date Noted  . Secondary hyperparathyroidism of renal origin ([facility]) 06/27/2015  . Acute on chronic kidney failure ([facility]) 06/21/2015  . Nausea with vomiting 06/21/2015  . Hypokalemia 06/21/2015  . Type 2 diabetes mellitus ([facility]) 06/21/2015  . Constipation 04/28/2015  . Ileus ([facility]) 04/28/2015  . Emesis   . Hypoglycemia 04/05/2015  . Nausea & vomiting 02/19/2015  . Dysmenorrhea 01/07/2015  . Metabolic acidosis 12/18/2014  . Chronic constipation 10/01/2014  . Current smoker 10/01/2014  . Gastritis 09/08/2014  . CKD (chronic kidney disease), stage V ([facility]) 09/08/2014  . AKI (acute kidney injury) ([facility]) 09/08/2014  . 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 Anemia, chronic renal failure 09/08/2014    Past Surgical History:  Procedure Laterality Date  . ABSCESS DRAINAGE      Laparoscopic, abdominal  . ABSCESS DRAINAGE Left    arm  . BASCILIC VEIN TRANSPOSITION Left 06/27/2015   Procedure: FIRST STAGE BASILIC VEIN TRANSPOSITION;  Surgeon: Todd F Early, MD;  Location: [facility] OR;  Service: Vascular;  Laterality: Left;  . INSERTION OF DIALYSIS CATHETER Right 08/16/2015   Procedure: INSERTION OF DIALYSIS CATHETER - RIGHT INTERNAL JUGULAR PLACEMENT;  Surgeon: Charles E Fields, MD;  Location: [facility] OR;  Service: Vascular;  Laterality: Right;  . MULTIPLE TOOTH EXTRACTIONS      OB History    Gravida Para Term Preterm AB Living   0 0 0 0 0 0   SAB TAB Ectopic Multiple Live Births   0 0 0 0         Home Medications    Prior to Admission medications   Medication Sig Start Date End Date Taking? Authorizing Provider  Blood Glucose Monitoring Suppl (TRUE METRIX METER) w/Device KIT 1 each by Does not apply route 3 (three) times daily. 01/07/15   Josalyn Funches, MD  calcitRIOL (ROCALTROL) 0.25 MCG capsule Take 0.25 mcg by mouth every Monday, Wednesday, and Friday. 07/25/15   Historical Provider, MD  cloNIDine (CATAPRES) 0.3 MG tablet Take 1 tablet (0.3 mg total) by mouth 3 (three) times daily. 08/15/15   Josalyn Funches, MD  diltiazem (CARDIZEM CD) 180 MG 24 hr capsule TAKE 2 CAPSULES BY MOUTH DAILY. 09/27/15   Josalyn Funches,   MD  furosemide (LASIX) 40 MG tablet Take 1 tablet (40 mg total) by mouth 2 (two) times daily. Patient taking differently: Take 80 mg by mouth daily.  06/24/15   Bonnell Public, MD  glucose blood (FREESTYLE TEST STRIPS) test strip Use as instructed 01/07/15   Boykin Nearing, MD  glucose blood (TRUE METRIX BLOOD GLUCOSE TEST) test strip Use as instructed 01/07/15   Josalyn Funches, MD  hydrALAZINE (APRESOLINE) 100 MG tablet TAKE 1 TABLET BY MOUTH 3 TIMES DAILY. 09/27/15   Josalyn Funches, MD  insulin aspart (NOVOLOG) 100 UNIT/ML injection Inject into the skin 3 (three) times daily before meals. As per sliding scale    Historical Provider, MD  insulin  glargine (LANTUS) 100 UNIT/ML injection Inject 0.12 mLs (12 Units total) into the skin at bedtime. 08/29/15   Boykin Nearing, MD  metoCLOPramide (REGLAN) 10 MG tablet Take 1 tablet (10 mg total) by mouth every 8 (eight) hours as needed for nausea or vomiting. 10/02/15   Jola Schmidt, MD  multivitamin (RENA-VIT) TABS tablet Take 1 tablet by mouth at bedtime. 06/24/15   Bonnell Public, MD  ondansetron (ZOFRAN ODT) 8 MG disintegrating tablet Take 1 tablet (8 mg total) by mouth every 8 (eight) hours as needed for nausea or vomiting. 10/02/15   Jola Schmidt, MD  oxyCODONE-acetaminophen (PERCOCET/ROXICET) 5-325 MG tablet Take 1-2 tablets by mouth every 6 (six) hours as needed for severe pain. 08/16/15   Elam Dutch, MD  sodium bicarbonate 650 MG tablet Take 1 tablet (650 mg total) by mouth 3 (three) times daily. 08/15/15   Josalyn Funches, MD  TRUEPLUS LANCETS 28G MISC 1 each by Does not apply route 3 (three) times daily. 01/07/15   Boykin Nearing, MD    Family History Family History  Problem Relation Age of Onset  . Hypertension Mother   . Diabetes Mother     Social History Social History  Substance Use Topics  . Smoking status: Former Smoker    Packs/day: 0.25    Years: 15.00    Quit date: 08/14/2015  . Smokeless tobacco: Never Used  . Alcohol use No     Comment: less than a ppd, "not much"     Allergies   No known allergies   Review of Systems Review of Systems ROS reviewed and all are negative for acute change except as noted in the HPI.  Physical Exam Updated Vital Signs BP 185/99   Pulse 65   Resp 18   Ht 5' 5" (1.651 m)   Wt 78.5 kg   LMP 09/29/2015   SpO2 100%   BMI 28.79 kg/m   Physical Exam  Constitutional: She is oriented to person, place, and time. Vital signs are normal. She appears well-developed and well-nourished.  Pt well appearing. NAD.  HENT:  Head: Normocephalic.  Right Ear: Hearing normal.  Left Ear: Hearing normal.  Eyes: Conjunctivae and EOM  are normal. Pupils are equal, round, and reactive to light.  Neck: Normal range of motion. Neck supple.  Cardiovascular: Normal rate, regular rhythm, normal heart sounds and intact distal pulses.   Pulmonary/Chest: Effort normal and breath sounds normal.  Abdominal: Soft.  Musculoskeletal: Normal range of motion.  Neurological: She is alert and oriented to person, place, and time.  Skin: Skin is warm and dry.  Psychiatric: She has a normal mood and affect. Her speech is normal and behavior is normal. Thought content normal.  Nursing note and vitals reviewed.  ED Treatments / Results  Labs (  all labs ordered are listed, but only abnormal results are displayed) Labs Reviewed  COMPREHENSIVE METABOLIC PANEL - Abnormal; Notable for the following:       Result Value   CO2 19 (*)    Glucose, Bld 330 (*)    BUN 46 (*)    Creatinine, Ser 6.19 (*)    Total Protein 6.1 (*)    GFR calc non Af Amer 7 (*)    GFR calc Af Amer 9 (*)    Anion gap 17 (*)    All other components within normal limits  CBC - Abnormal; Notable for the following:    Hemoglobin 11.6 (*)    RDW 16.2 (*)    All other components within normal limits  URINALYSIS, ROUTINE W REFLEX MICROSCOPIC (NOT AT [facility]) - Abnormal; Notable for the following:    Glucose, UA >1000 (*)    Hgb urine dipstick SMALL (*)    Ketones, ur 15 (*)    Protein, ur >300 (*)    All other components within normal limits  URINE MICROSCOPIC-ADD ON - Abnormal; Notable for the following:    Squamous Epithelial / LPF 6-30 (*)    Bacteria, UA FEW (*)    All other components within normal limits  I-STAT VENOUS BLOOD GAS, ED - Abnormal; Notable for the following:    pH, Ven 7.437 (*)    pCO2, Ven 32.7 (*)    pO2, Ven 104.0 (*)    All other components within normal limits  LIPASE, BLOOD  I-STAT BETA HCG BLOOD, ED ([facility], [facility], [facility] ONLY)    EKG  EKG Interpretation None      Radiology No results found.  Procedures Procedures (including critical care  time)  Medications Ordered in ED Medications  ondansetron (ZOFRAN) injection 4 mg (4 mg Intravenous Given 10/07/15 1332)  sodium chloride 0.9 % bolus 500 mL (500 mLs Intravenous New Bag/Given 10/07/15 1350)  metoCLOPramide (REGLAN) injection 10 mg (10 mg Intravenous Given 10/07/15 1447)  insulin aspart (novoLOG) injection 5 Units (5 Units Intravenous Given 10/07/15 1615)  hydrALAZINE (APRESOLINE) injection 5 mg (5 mg Intravenous Given 10/07/15 1615)  haloperidol lactate (HALDOL) injection 5 mg (5 mg Intravenous Given 10/07/15 1615)   Initial Impression / Assessment and Plan / ED Course  I have reviewed the triage vital signs and the nursing notes.  Pertinent labs & imaging results that were available during my care of the patient were reviewed by me and considered in my medical decision making (see chart for details).  Clinical Course    Final Clinical Impressions(s) / ED Diagnoses  I have reviewed and evaluated the relevant laboratory values. I have reviewed the relevant previous healthcare records. I obtained HPI from historian. Patient discussed with supervising physician  ED Course:  Assessment: Pt is a 71yF with hx CKD on Dialysis, DM2 who presents with N/V at 6AM. Dialysis patient MWF. Did not complete treatment today due to emesis. Seen in ED previously for same. On exam, pt in NAD. Nontoxic/nonseptic appearing. VSS. Afebrile. Lungs CTA. Heart RRR. Abdomen nontender soft. No active vomiting in ED. Zofran and fluids given. Glucose 330. Gap 17. Chloride 102. CO2 19. No Leukocytosis. Creatinine 6.19. Potassium WNL. UA with Ketones 15. VBG without acidosis. No evidence of DKA. BP elevated in 190s. Recently stopped clonidine x3 days ago. Will give dose today as pt believes she threw up BP meds. Given Insulin and Haldol. Plan is to DC home with Rx. Phenergan. Discussed with supervising physician. Pt will go  to Dialysis for treatment tomorrow as outpatient. At time of discharge, Patient is in no  acute distress. Vital Signs are stable. Patient is able to ambulate. Patient able to tolerate   Disposition/Plan:  DC Home Additional Verbal discharge instructions given and discussed with patient.  Pt Instructed to f/u with PCP in the next week for evaluation and treatment of symptoms. Return precautions given Pt acknowledges and agrees with plan  Supervising Physician Daniel Knott, MD   Final diagnoses:  Non-intractable vomiting with nausea, unspecified vomiting type    New Prescriptions New Prescriptions   No medications on file      , PA-C 10/07/15 1633    Daniel Knott, MD 10/08/15 1035  

## 2015-10-07 NOTE — ED Notes (Signed)
Pt. Offered bus pass multiple times, pt. States she does not know the bus system and needs to get back to car at dialysis. Pt. Microbiologistequesting secretary call for taxi, Diplomatic Services operational officersecretary informed and placed call for taxi.

## 2015-10-07 NOTE — ED Notes (Signed)
Pt able to ambulate to bathroom with no assistance  

## 2015-10-07 NOTE — Discharge Instructions (Signed)
Please read and follow all provided instructions.  Your diagnoses today include:  1. Non-intractable vomiting with nausea, unspecified vomiting type    Tests performed today include: Vital signs. See below for your results today.   Medications prescribed:  Take as prescribed   Home care instructions:  Follow any educational materials contained in this packet.  Follow-up instructions: Please follow-up with your primary care provider for further evaluation of symptoms and treatment   Return instructions:  Please return to the Emergency Department if you do not get better, if you get worse, or new symptoms OR  - Fever (temperature greater than 101.6F)  - Bleeding that does not stop with holding pressure to the area    -Severe pain (please note that you may be more sore the day after your accident)  - Chest Pain  - Difficulty breathing  - Severe nausea or vomiting  - Inability to tolerate food and liquids  - Passing out  - Skin becoming red around your wounds  - Change in mental status (confusion or lethargy)  - New numbness or weakness    Please return if you have any other emergent concerns.  Additional Information:  Your vital signs today were: BP 200/59    Pulse 65    Resp 22    Ht 5\' 5"  (1.651 m)    Wt 78.5 kg    LMP 09/29/2015    SpO2 99%    BMI 28.79 kg/m  If your blood pressure (BP) was elevated above 135/85 this visit, please have this repeated by your doctor within one month. ---------------

## 2015-10-07 NOTE — ED Notes (Signed)
Dr Clydene PughKnott made aware of pt. Improving blood pressure, ok to d/c patient.

## 2015-10-11 ENCOUNTER — Telehealth: Payer: Self-pay | Admitting: Family Medicine

## 2015-10-11 MED ORDER — INSULIN ASPART 100 UNIT/ML ~~LOC~~ SOLN: SUBCUTANEOUS | 3 refills | Status: DC

## 2015-10-11 MED FILL — !NOVOLOG 100UNITS/ML VIAL: 100/ML | 28 days supply | Qty: 10 | Fill #0

## 2015-10-11 NOTE — Telephone Encounter (Signed)
Insulin reordered.

## 2015-10-11 NOTE — Telephone Encounter (Signed)
Pt. Called requesting a refill on insulin aspart (NOVOLOG) 100 UNIT/ML injection  Please f/u

## 2015-10-12 ENCOUNTER — Encounter (HOSPITAL_COMMUNITY): Payer: Self-pay | Admitting: Neurology

## 2015-10-12 ENCOUNTER — Observation Stay (HOSPITAL_BASED_OUTPATIENT_CLINIC_OR_DEPARTMENT_OTHER)
Admission: EM | Admit: 2015-10-12 | Discharge: 2015-10-13 | Disposition: A | Discharge status: Home / Self Care | Payer: Medicaid Other | Source: Home / Self Care | Attending: Emergency Medicine | Admitting: Emergency Medicine

## 2015-10-12 DIAGNOSIS — Z992 Dependence on renal dialysis: Secondary | ICD-10-CM

## 2015-10-12 DIAGNOSIS — N185 Chronic kidney disease, stage 5: Secondary | ICD-10-CM | POA: Insufficient documentation

## 2015-10-12 DIAGNOSIS — K567 Ileus, unspecified: Secondary | ICD-10-CM

## 2015-10-12 DIAGNOSIS — N179 Acute kidney failure, unspecified: Secondary | ICD-10-CM | POA: Insufficient documentation

## 2015-10-12 DIAGNOSIS — E119 Type 2 diabetes mellitus without complications: Secondary | ICD-10-CM

## 2015-10-12 DIAGNOSIS — E11649 Type 2 diabetes mellitus with hypoglycemia without coma: Secondary | ICD-10-CM | POA: Insufficient documentation

## 2015-10-12 DIAGNOSIS — K219 Gastro-esophageal reflux disease without esophagitis: Secondary | ICD-10-CM | POA: Insufficient documentation

## 2015-10-12 DIAGNOSIS — E1122 Type 2 diabetes mellitus with diabetic chronic kidney disease: Secondary | ICD-10-CM

## 2015-10-12 DIAGNOSIS — K2970 Gastritis, unspecified, without bleeding: Secondary | ICD-10-CM | POA: Insufficient documentation

## 2015-10-12 DIAGNOSIS — I120 Hypertensive chronic kidney disease with stage 5 chronic kidney disease or end stage renal disease: Secondary | ICD-10-CM | POA: Insufficient documentation

## 2015-10-12 DIAGNOSIS — R739 Hyperglycemia, unspecified: Secondary | ICD-10-CM | POA: Diagnosis present

## 2015-10-12 DIAGNOSIS — R112 Nausea with vomiting, unspecified: Secondary | ICD-10-CM | POA: Diagnosis present

## 2015-10-12 DIAGNOSIS — E1110 Type 2 diabetes mellitus with ketoacidosis without coma: Secondary | ICD-10-CM

## 2015-10-12 DIAGNOSIS — E876 Hypokalemia: Secondary | ICD-10-CM

## 2015-10-12 DIAGNOSIS — K3184 Gastroparesis: Secondary | ICD-10-CM | POA: Insufficient documentation

## 2015-10-12 DIAGNOSIS — Z8249 Family history of ischemic heart disease and other diseases of the circulatory system: Secondary | ICD-10-CM | POA: Insufficient documentation

## 2015-10-12 DIAGNOSIS — Z87891 Personal history of nicotine dependence: Secondary | ICD-10-CM | POA: Insufficient documentation

## 2015-10-12 DIAGNOSIS — Z794 Long term (current) use of insulin: Secondary | ICD-10-CM | POA: Insufficient documentation

## 2015-10-12 DIAGNOSIS — I700 Atherosclerosis of aorta: Secondary | ICD-10-CM | POA: Insufficient documentation

## 2015-10-12 DIAGNOSIS — E1143 Type 2 diabetes mellitus with diabetic autonomic (poly)neuropathy: Secondary | ICD-10-CM | POA: Insufficient documentation

## 2015-10-12 DIAGNOSIS — F329 Major depressive disorder, single episode, unspecified: Secondary | ICD-10-CM

## 2015-10-12 DIAGNOSIS — K5900 Constipation, unspecified: Secondary | ICD-10-CM | POA: Diagnosis present

## 2015-10-12 DIAGNOSIS — D631 Anemia in chronic kidney disease: Secondary | ICD-10-CM | POA: Insufficient documentation

## 2015-10-12 DIAGNOSIS — N2581 Secondary hyperparathyroidism of renal origin: Secondary | ICD-10-CM

## 2015-10-12 DIAGNOSIS — Z79899 Other long term (current) drug therapy: Secondary | ICD-10-CM

## 2015-10-12 DIAGNOSIS — N946 Dysmenorrhea, unspecified: Secondary | ICD-10-CM | POA: Insufficient documentation

## 2015-10-12 DIAGNOSIS — N186 End stage renal disease: Secondary | ICD-10-CM

## 2015-10-12 DIAGNOSIS — Z833 Family history of diabetes mellitus: Secondary | ICD-10-CM

## 2015-10-12 LAB — COMPREHENSIVE METABOLIC PANEL
ALK PHOS: 121 U/L (ref 38–126)
ALT: 16 U/L (ref 14–54)
AST: 20 U/L (ref 15–41)
Albumin: 3.7 g/dL (ref 3.5–5.0)
Anion gap: 17 — ABNORMAL HIGH (ref 5–15)
BUN: 50 mg/dL — AB (ref 6–20)
CALCIUM: 9.5 mg/dL (ref 8.9–10.3)
CHLORIDE: 102 mmol/L (ref 101–111)
CO2: 21 mmol/L — AB (ref 22–32)
CREATININE: 6.07 mg/dL — AB (ref 0.44–1.00)
GFR calc Af Amer: 9 mL/min — ABNORMAL LOW (ref >60)
GFR calc non Af Amer: 8 mL/min — ABNORMAL LOW (ref >60)
GLUCOSE: 365 mg/dL — AB (ref 65–99)
Potassium: 3.9 mmol/L (ref 3.5–5.1)
SODIUM: 140 mmol/L (ref 135–145)
Total Bilirubin: 0.8 mg/dL (ref 0.3–1.2)
Total Protein: 6.8 g/dL (ref 6.5–8.1)

## 2015-10-12 LAB — CBC WITH DIFFERENTIAL/PLATELET
BASOS PCT: 0 %
Basophils Absolute: 0 10*3/uL (ref 0.0–0.1)
EOS ABS: 0.2 10*3/uL (ref 0.0–0.7)
EOS PCT: 2 %
HCT: 35.7 % — ABNORMAL LOW (ref 36.0–46.0)
HEMOGLOBIN: 12.3 g/dL (ref 12.0–15.0)
LYMPHS ABS: 1.3 10*3/uL (ref 0.7–4.0)
Lymphocytes Relative: 13 %
MCH: 29.6 pg (ref 26.0–34.0)
MCHC: 34.5 g/dL (ref 30.0–36.0)
MCV: 86 fL (ref 78.0–100.0)
MONO ABS: 0.6 10*3/uL (ref 0.1–1.0)
MONOS PCT: 7 %
Neutro Abs: 7.5 10*3/uL (ref 1.7–7.7)
Neutrophils Relative %: 78 %
PLATELETS: 193 10*3/uL (ref 150–400)
RBC: 4.15 MIL/uL (ref 3.87–5.11)
RDW: 16.1 % — AB (ref 11.5–15.5)
WBC: 9.6 10*3/uL (ref 4.0–10.5)

## 2015-10-12 LAB — I-STAT VENOUS BLOOD GAS, ED
ACID-BASE DEFICIT: 4 mmol/L — AB (ref 0.0–2.0)
Bicarbonate: 19.8 mmol/L — ABNORMAL LOW (ref 20.0–28.0)
O2 SAT: 96 %
PCO2 VEN: 31.1 mmHg — AB (ref 44.0–60.0)
TCO2: 21 mmol/L (ref 0–100)
pH, Ven: 7.411 (ref 7.250–7.430)
pO2, Ven: 83 mmHg — ABNORMAL HIGH (ref 32.0–45.0)

## 2015-10-12 LAB — GLUCOSE, CAPILLARY
GLUCOSE-CAPILLARY: 336 mg/dL — AB (ref 65–99)
GLUCOSE-CAPILLARY: 197 mg/dL — AB (ref 65–99)
GLUCOSE-CAPILLARY: 113 mg/dL — AB (ref 65–99)
Glucose-Capillary: 486 mg/dL — ABNORMAL HIGH (ref 65–99)

## 2015-10-12 LAB — CBG MONITORING, ED
Glucose-Capillary: >600 mg/dL (ref 65–99)
Glucose-Capillary: 561 mg/dL (ref 65–99)

## 2015-10-12 LAB — I-STAT BETA HCG BLOOD, ED (MC, WL, AP ONLY): I-stat hCG, quantitative: 5.7 m[IU]/mL — ABNORMAL HIGH (ref <5)

## 2015-10-12 LAB — BASIC METABOLIC PANEL
ANION GAP: 15 (ref 5–15)
BUN: 56 mg/dL — ABNORMAL HIGH (ref 6–20)
CHLORIDE: 107 mmol/L (ref 101–111)
CO2: 17 mmol/L — AB (ref 22–32)
Calcium: 7.7 mg/dL — ABNORMAL LOW (ref 8.9–10.3)
Creatinine, Ser: 6 mg/dL — ABNORMAL HIGH (ref 0.44–1.00)
GFR calc non Af Amer: 8 mL/min — ABNORMAL LOW (ref >60)
GFR, EST AFRICAN AMERICAN: 9 mL/min — AB (ref >60)
GLUCOSE: 405 mg/dL — AB (ref 65–99)
POTASSIUM: 3.2 mmol/L — AB (ref 3.5–5.1)
Sodium: 139 mmol/L (ref 135–145)

## 2015-10-12 LAB — HCG, SERUM, QUALITATIVE: Preg, Serum: NEGATIVE

## 2015-10-12 LAB — MRSA PCR SCREENING: MRSA by PCR: NEGATIVE

## 2015-10-12 LAB — LIPASE, BLOOD: LIPASE: 22 U/L (ref 11–51)

## 2015-10-12 MED ORDER — CALCITRIOL 0.25 MCG PO CAPS: 0.2500 ug | ORAL_CAPSULE | ORAL | Status: DC

## 2015-10-12 MED ORDER — HYDRALAZINE HCL 20 MG/ML IJ SOLN
5.0000 mg | Freq: Once | INTRAMUSCULAR | Status: AC
  Administered 2015-10-12: 5 mg via INTRAVENOUS
  Filled 2015-10-12: qty 1

## 2015-10-12 MED ORDER — SODIUM CHLORIDE 0.9 % IV SOLN
INTRAVENOUS | Status: DC
  Administered 2015-10-12: 14:00:00 via INTRAVENOUS

## 2015-10-12 MED ORDER — METOCLOPRAMIDE HCL 10 MG PO TABS: 10.0000 mg | ORAL_TABLET | Freq: Three times a day (TID) | ORAL | Status: DC

## 2015-10-12 MED ORDER — HYDRALAZINE HCL 50 MG PO TABS
100.0000 mg | ORAL_TABLET | Freq: Three times a day (TID) | ORAL | Status: DC
  Administered 2015-10-12 – 2015-10-13 (×2): 100 mg via ORAL
  Filled 2015-10-12 (×2): qty 2

## 2015-10-12 MED ORDER — HEPARIN SODIUM (PORCINE) 5000 UNIT/ML IJ SOLN
5000.0000 [IU] | Freq: Three times a day (TID) | INTRAMUSCULAR | Status: DC
  Administered 2015-10-12: 5000 [IU] via SUBCUTANEOUS
  Filled 2015-10-12 (×3): qty 1

## 2015-10-12 MED ORDER — DEXTROSE-NACL 5-0.45 % IV SOLN
INTRAVENOUS | Status: DC
  Administered 2015-10-12: 23:00:00 via INTRAVENOUS

## 2015-10-12 MED ORDER — SODIUM CHLORIDE 0.9 % IV SOLN
INTRAVENOUS | Status: DC
  Administered 2015-10-12: 5.4 [IU]/h via INTRAVENOUS
  Filled 2015-10-12: qty 2.5

## 2015-10-12 MED ORDER — CLONIDINE HCL 0.1 MG PO TABS
0.3000 mg | ORAL_TABLET | Freq: Three times a day (TID) | ORAL | Status: DC
  Administered 2015-10-12: 0.3 mg via ORAL
  Filled 2015-10-12 (×4): qty 1

## 2015-10-12 MED ORDER — POTASSIUM CHLORIDE IN NACL 20-0.9 MEQ/L-% IV SOLN
INTRAVENOUS | Status: DC
  Administered 2015-10-12: 19:00:00 via INTRAVENOUS
  Filled 2015-10-12: qty 1000

## 2015-10-12 MED ORDER — FAMOTIDINE IN NACL 20-0.9 MG/50ML-% IV SOLN
20.0000 mg | INTRAVENOUS | Status: DC
  Filled 2015-10-12: qty 50

## 2015-10-12 MED ORDER — RENA-VITE PO TABS
1.0000 | ORAL_TABLET | Freq: Every day | ORAL | Status: DC
  Administered 2015-10-12: 1 via ORAL
  Filled 2015-10-12: qty 1

## 2015-10-12 MED ORDER — HALOPERIDOL LACTATE 5 MG/ML IJ SOLN
5.0000 mg | Freq: Once | INTRAMUSCULAR | Status: AC
  Administered 2015-10-12: 5 mg via INTRAVENOUS
  Filled 2015-10-12: qty 1

## 2015-10-12 MED ORDER — METOCLOPRAMIDE HCL 5 MG PO TABS
5.0000 mg | ORAL_TABLET | Freq: Three times a day (TID) | ORAL | Status: DC
  Administered 2015-10-12: 5 mg via ORAL
  Filled 2015-10-12 (×3): qty 1

## 2015-10-12 MED ORDER — SODIUM CHLORIDE 0.9 % IV BOLUS (SEPSIS)
500.0000 mL | Freq: Once | INTRAVENOUS | Status: AC
  Administered 2015-10-12: 500 mL via INTRAVENOUS

## 2015-10-12 MED ORDER — LORAZEPAM 2 MG/ML IJ SOLN
1.0000 mg | Freq: Once | INTRAMUSCULAR | Status: AC
  Administered 2015-10-12: 1 mg via INTRAVENOUS
  Filled 2015-10-12: qty 1

## 2015-10-12 MED ORDER — ONDANSETRON HCL 4 MG/2ML IJ SOLN
4.0000 mg | Freq: Once | INTRAMUSCULAR | Status: AC
  Administered 2015-10-12: 4 mg via INTRAVENOUS
  Filled 2015-10-12: qty 2

## 2015-10-12 MED ORDER — METOCLOPRAMIDE HCL 10 MG PO TABS: 10.0000 mg | ORAL_TABLET | Freq: Three times a day (TID) | ORAL | Status: DC | PRN

## 2015-10-12 MED ORDER — ONDANSETRON HCL 4 MG/2ML IJ SOLN: 4.0000 mg | Freq: Four times a day (QID) | INTRAMUSCULAR | Status: DC | PRN

## 2015-10-12 MED ORDER — DILTIAZEM HCL ER COATED BEADS 180 MG PO CP24
360.0000 mg | ORAL_CAPSULE | Freq: Every day | ORAL | Status: DC
  Administered 2015-10-13: 360 mg via ORAL
  Filled 2015-10-12: qty 3

## 2015-10-12 NOTE — ED Triage Notes (Addendum)
Per ems- Pt coming from home c/o n/v since 0400 this am. Is dialysis pt, last session was Monday, is due today. Unable to establish IV. BP 200/80, AFIB HF 60-80. Pt is dry heaving audibly. CBG 400.

## 2015-10-12 NOTE — ED Notes (Signed)
Pt states she is unable to provide urine sample at this time.

## 2015-10-12 NOTE — ED Notes (Signed)
Called 4E to give report.  Receiving RN unable to take report at this time 

## 2015-10-12 NOTE — ED Provider Notes (Signed)
[facility] DEPT Provider Note   CSN: [id number] Arrival date & time: 10/12/15  1019     History   Chief Complaint Chief Complaint  Patient presents with  . Nausea  . Emesis    HPI Crystal Tanner is a 45 y.o. female.  The history is provided by the patient and medical records. No language interpreter was used.  Emesis   Pertinent negatives include no abdominal pain, no chills, no cough, no diarrhea, no fever and no headaches.   Crystal Tanner is a 45 y.o. female  with a PMH of CKD on dialysis MWF, DM, HTN, GERD, depression who presents to the Emergency Department complaining of nausea and vomiting that began this morning at approximately 4am. She denies fever, abdominal pain, back pain, chest pain, shortness of breath, constipation, diarrhea, headache. No medications taken prior to arrival for symptoms. She is on blood pressure medication, but states she did not take it today because she knew she would throw it up. She did go to dialysis as scheduled on Monday, however has not done dialysis today: her appointment is at 11:30   Past Medical History:  Diagnosis Date  . Acute kidney injury ([place])   . Anemia   . Chronic kidney disease   . Constipation   . Depression   . Diabetes mellitus without complication ([place]) 1287   Type II  . Gastritis   . GERD (gastroesophageal reflux disease)   . Heart murmur    as a child- doesnt remember having echo.  . Hemorrhoid   . Hypertension   . Renal cyst, left   . Shortness of breath dyspnea    with exertion    Patient Active Problem List   Diagnosis Date Noted  . Hyperglycemia 10/12/2015  . Secondary hyperparathyroidism of renal origin ([place]) 06/27/2015  . Acute on chronic kidney failure ([place]) 06/21/2015  . Nausea with vomiting 06/21/2015  . Hypokalemia 06/21/2015  . Type 2 diabetes mellitus ([place]) 06/21/2015  . Constipation 04/28/2015  . Ileus ([place]) 04/28/2015  . Emesis   . Hypoglycemia 04/05/2015  . Nausea & vomiting  02/19/2015  . Dysmenorrhea 01/07/2015  . Metabolic acidosis 86/76/7209  . Chronic constipation 10/01/2014  . Current smoker 10/01/2014  . Gastritis 09/08/2014  . CKD (chronic kidney disease), stage V ([place]) 09/08/2014  . AKI (acute kidney injury) ([place]) 09/08/2014  . 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 Anemia, chronic renal failure 09/08/2014    Past Surgical History:  Procedure Laterality Date  . ABSCESS DRAINAGE     Laparoscopic, abdominal  . ABSCESS DRAINAGE Left    arm  . BASCILIC VEIN TRANSPOSITION Left 06/27/2015   Procedure: FIRST STAGE BASILIC VEIN TRANSPOSITION;  Surgeon: Rosetta Posner, MD;  Location: [facility];  Service: Vascular;  Laterality: Left;  . INSERTION OF DIALYSIS CATHETER Right 08/16/2015   Procedure: INSERTION OF DIALYSIS CATHETER - RIGHT INTERNAL JUGULAR PLACEMENT;  Surgeon: Elam Dutch, MD;  Location: [facility];  Service: Vascular;  Laterality: Right;  . MULTIPLE TOOTH EXTRACTIONS      OB History    Gravida Para Term Preterm AB Living   0 0 0 0 0 0   SAB TAB Ectopic Multiple Live Births   0 0 0 0         Home Medications    Prior to Admission medications   Medication Sig Start Date End Date Taking? Authorizing Provider  Blood Glucose Monitoring Suppl (TRUE METRIX METER) w/Device KIT 1 each by Does not apply route 3 (three) times  daily. 01/07/15  Yes Boykin Nearing, MD  cloNIDine (CATAPRES) 0.3 MG tablet Take 1 tablet (0.3 mg total) by mouth 3 (three) times daily. 08/15/15  Yes Josalyn Funches, MD  diltiazem (CARDIZEM CD) 180 MG 24 hr capsule TAKE 2 CAPSULES BY MOUTH DAILY. 09/27/15  Yes Josalyn Funches, MD  hydrALAZINE (APRESOLINE) 100 MG tablet TAKE 1 TABLET BY MOUTH 3 TIMES DAILY. 09/27/15  Yes Josalyn Funches, MD  insulin aspart (NOVOLOG) 100 UNIT/ML injection As per sliding scale 10/11/15  Yes Josalyn Funches, MD  insulin glargine (LANTUS) 100 UNIT/ML injection Inject 0.12 mLs (12 Units total) into the skin at bedtime. 08/29/15  Yes Josalyn Funches, MD    metoCLOPramide (REGLAN) 10 MG tablet Take 1 tablet (10 mg total) by mouth every 8 (eight) hours as needed for nausea or vomiting. 10/02/15  Yes Jola Schmidt, MD  multivitamin (RENA-VIT) TABS tablet Take 1 tablet by mouth at bedtime. 06/24/15  Yes Bonnell Public, MD  ondansetron (ZOFRAN ODT) 8 MG disintegrating tablet Take 1 tablet (8 mg total) by mouth every 8 (eight) hours as needed for nausea or vomiting. 10/02/15  Yes Jola Schmidt, MD  TRUEPLUS LANCETS 28G MISC 1 each by Does not apply route 3 (three) times daily. 01/07/15  Yes Boykin Nearing, MD  calcitRIOL (ROCALTROL) 0.25 MCG capsule Take 0.25 mcg by mouth every Monday, Wednesday, and Friday. 07/25/15   Historical Provider, MD  furosemide (LASIX) 40 MG tablet Take 1 tablet (40 mg total) by mouth 2 (two) times daily. Patient not taking: Reported on 10/12/2015 06/24/15   Bonnell Public, MD  glucose blood (FREESTYLE TEST STRIPS) test strip Use as instructed 01/07/15   Boykin Nearing, MD  glucose blood (TRUE METRIX BLOOD GLUCOSE TEST) test strip Use as instructed 01/07/15   Boykin Nearing, MD  oxyCODONE-acetaminophen (PERCOCET/ROXICET) 5-325 MG tablet Take 1-2 tablets by mouth every 6 (six) hours as needed for severe pain. Patient not taking: Reported on 10/12/2015 08/16/15   Elam Dutch, MD  promethazine (PHENERGAN) 25 MG suppository Place 1 suppository (25 mg total) rectally every 6 (six) hours as needed for nausea or vomiting. Patient not taking: Reported on 10/12/2015 10/07/15   Shary Decamp, PA-C  sodium bicarbonate 650 MG tablet Take 1 tablet (650 mg total) by mouth 3 (three) times daily. Patient not taking: Reported on 10/12/2015 08/15/15   Boykin Nearing, MD    Family History Family History  Problem Relation Age of Onset  . Hypertension Mother   . Diabetes Mother     Social History Social History  Substance Use Topics  . Smoking status: Former Smoker    Packs/day: 0.25    Years: 15.00    Quit date: 08/14/2015  . Smokeless  tobacco: Never Used  . Alcohol use No     Comment: less than a ppd, "not much"     Allergies   No known allergies   Review of Systems Review of Systems  Constitutional: Negative for chills and fever.  HENT: Negative for congestion.   Eyes: Negative for visual disturbance.  Respiratory: Negative for cough and shortness of breath.   Cardiovascular: Negative.   Gastrointestinal: Positive for nausea and vomiting. Negative for abdominal pain, constipation and diarrhea.  Genitourinary: Negative for dysuria.  Musculoskeletal: Negative for back pain and neck pain.  Skin: Negative for rash.  Neurological: Negative for headaches.     Physical Exam Updated Vital Signs BP (!) 176/50 (BP Location: Right Arm)   Pulse 92   Temp 97.6 F (36.4 C) (  Oral)   Resp 14   Ht 5' 7"  (1.702 m)   Wt 79.4 kg   LMP 09/29/2015   SpO2 100%   BMI 27.41 kg/m   Physical Exam  Constitutional: She is oriented to person, place, and time. She appears well-developed and well-nourished. No distress.  HENT:  Head: Normocephalic and atraumatic.  Cardiovascular: Normal rate, regular rhythm, normal heart sounds and intact distal pulses.  Exam reveals no gallop and no friction rub.   No murmur heard. Pulmonary/Chest: Effort normal and breath sounds normal. No respiratory distress. She has no wheezes. She has no rales. She exhibits no tenderness.  Abdominal: Soft. Bowel sounds are normal. She exhibits no distension. There is no tenderness.  Musculoskeletal: She exhibits no edema.  Neurological: She is alert and oriented to person, place, and time.  Skin: Skin is warm and dry.  Nursing note and vitals reviewed.    ED Treatments / Results  Labs (all labs ordered are listed, but only abnormal results are displayed) Labs Reviewed  COMPREHENSIVE METABOLIC PANEL - Abnormal; Notable for the following:       Result Value   CO2 21 (*)    Glucose, Bld 365 (*)    BUN 50 (*)    Creatinine, Ser 6.07 (*)    GFR  calc non Af Amer 8 (*)    GFR calc Af Amer 9 (*)    Anion gap 17 (*)    All other components within normal limits  CBC WITH DIFFERENTIAL/PLATELET - Abnormal; Notable for the following:    HCT 35.7 (*)    RDW 16.1 (*)    All other components within normal limits  I-STAT BETA HCG BLOOD, ED ([facility], [facility], [facility] ONLY) - Abnormal; Notable for the following:    I-stat hCG, quantitative 5.7 (*)    All other components within normal limits  LIPASE, BLOOD  URINALYSIS, ROUTINE W REFLEX MICROSCOPIC (NOT AT [facility])  BLOOD GAS, VENOUS    EKG  EKG Interpretation None       Radiology No results found.  Procedures Procedures (including critical care time)  Medications Ordered in ED Medications  dextrose 5 %-0.45 % sodium chloride infusion (not administered)  insulin regular (NOVOLIN R,HUMULIN R) 250 Units in sodium chloride 0.9 % 250 mL (1 Units/mL) infusion (not administered)  0.9 %  sodium chloride infusion (not administered)  ondansetron (ZOFRAN) injection 4 mg (4 mg Intravenous Given 10/12/15 1058)  LORazepam (ATIVAN) injection 1 mg (1 mg Intravenous Given 10/12/15 1100)  hydrALAZINE (APRESOLINE) injection 5 mg (5 mg Intravenous Given 10/12/15 1102)  sodium chloride 0.9 % bolus 500 mL (500 mLs Intravenous New Bag/Given 10/12/15 1206)  haloperidol lactate (HALDOL) injection 5 mg (5 mg Intravenous Given 10/12/15 1210)     Initial Impression / Assessment and Plan / ED Course  I have reviewed the triage vital signs and the nursing notes.  Pertinent labs & imaging results that were available during my care of the patient were reviewed by me and considered in my medical decision making (see chart for details).  Clinical Course   Crystal Tanner is a 45 y.o. female who presents to ED for nausea and vomiting that began this morning. On exam, patient is afebrile with a nonsurgical abdomen. Hypertensive upon arrival- not taking home meds 2/2 n/v. Hydralazine given in ED with BP improvement. CMP with  glucose of 365, anion gap of 17, CO2 of 21. She has a CKD patient on dialysis and makes little urine, therefore do not have  a UA at this time. 500 fluid bolus given. All other labs reviewed. Zofran given with little improvement. Patient reevaluated and still admits to terrible nausea. Haldol given. Patient again reevaluated and does not feel improved. She believes that she needs to stay in the hospital for further management of her nausea and vomiting. Internal medicine teaching service consulted who will admit.   Final Clinical Impressions(s) / ED Diagnoses   Final diagnoses:  Hyperglycemia  Nausea and vomiting, intractability of vomiting not specified, unspecified vomiting type    New Prescriptions New Prescriptions   No medications on file     [facility] Ward, PA-C 10/12/15 1411    Davonna Belling, MD 10/12/15 [phone]

## 2015-10-12 NOTE — H&P (Signed)
Date: 10/12/2015               Patient Name:  Crystal Tanner MRN: [id number]  DOB: 1970/03/27 Age / Sex: 45 y.o., female   PCP: Dessa Phi, MD         Medical Service: Internal Medicine Teaching Service         Attending Physician: Dr. Cliffton Asters, MD    First Contact: Dr. Eulah Pont  Pager: [phone]  Second Contact: Dr. Valentino Nose Pager: [phone]       After Hours (After 5p/  First Contact Pager: [phone]  weekends / holidays): Second Contact Pager: [phone]   Chief Complaint: n/v and abdominal pain  History of Present Illness: Crystal Tanner is a 45 y.o. female with a PMH of DM, ESRD on HD, gastritis, GERD, gastroparesis,  And HTN who presents with n/v. Pt states she had n/v x 1 week and has been seen in the ED for this twice last week. N/V acutely worsened this morning and she presented to the ED. Her n/v and abdominal pain was not able to be controlled with ativant, haldol, and zofran that was given in the ED, however by the time of our examination she reports her pain had improved. She was experiencing constipation up until one week ago but has been having increased frequency of bowel movements this week. She says she has also been experiencing congestion and runny nose for days. She denies fevers, sick contacts, acid reflux, and chest pain. She was told she had gastroparesis in the past but has not been taking any medications for it, nor has she been taking meds for her GERD. She had an episode similar to this last year but never found out the cause.   In the ED, she was found to have an elevated glucose of 365 and AG of 17. She is unable to provide a UA due to ESRD. She last presented to the ED on 10/6 w/ the same sx and her AG was 17, glucose 330, and she had ketones in her urine. She was started on a insulin gtt in the ED.   Meds:   (Not in a hospital admission)   Allergies: Allergies as of 10/12/2015 - Review Complete 10/12/2015  Allergen Reaction Noted  . No  known allergies  08/15/2015   Past Medical History:  Diagnosis Date  . Acute kidney injury (HCC)   . Anemia   . Chronic kidney disease   . Constipation   . Depression   . Diabetes mellitus without complication (HCC) 1991   Type II  . Gastritis   . GERD (gastroesophageal reflux disease)   . Heart murmur    as a child- doesnt remember having echo.  . Hemorrhoid   . Hypertension   . Renal cyst, left   . Shortness of breath dyspnea    with exertion    Family History  Problem Relation Age of Onset  . Hypertension Mother   . Diabetes Mother      Social History   Social History  . Marital status: Single    Spouse name: N/A  . Number of children: N/A  . Years of education: N/A   Occupational History  . Not on file.   Social History Main Topics  . Smoking status: Former Smoker    Packs/day: 0.25    Years: 15.00    Quit date: 08/14/2015  . Smokeless tobacco: Never Used  . Alcohol use No  Comment: less than a ppd, "not much"  . Drug use:     Types: Marijuana     Comment: last time August 11  . Sexual activity: No   Other Topics Concern  . Not on file   Social History Narrative  . No narrative on file     Review of Systems: A complete ROS was negative except as per HPI. Limited by patients cooperation.   Physical Exam: Vitals:   10/12/15 1130 10/12/15 1207 10/12/15 1401 10/12/15 1541  BP: 188/64  (!) 176/50 (!) 155/51  Pulse: 87  92 95  Resp: 18  14 14   Temp:      TempSrc:      SpO2: 100%  100% 98%  Weight:  175 lb (79.4 kg)    Height:  5\' 7"  (1.702 m)     Physical Exam  Constitutional: She appears well-developed and well-nourished. No distress.  Cardiovascular: Normal rate, regular rhythm and normal heart sounds.  Exam reveals no gallop and no friction rub.   No murmur heard. Pulmonary/Chest: No respiratory distress.  Nl inspiratory effort, no accessory muscle use   Abdominal: Soft. Bowel sounds are normal. She exhibits no distension and no  mass. There is no tenderness. There is no rebound and no guarding.  Musculoskeletal: She exhibits edema (trace pedal edema b/;).  Skin: Skin is warm and dry. No rash noted. No erythema. No pallor.  Neuro: CN 2-12 grossly intact  Labs: CBC:  Recent Labs Lab 10/07/15 1310 10/12/15 1040  WBC 9.0 9.6  NEUTROABS  --  7.5  HGB 11.6* 12.3  HCT 37.0 35.7*  MCV 89.4 86.0  PLT 180 193    Basic Metabolic Panel:  Recent Labs Lab 10/07/15 1310 10/12/15 1040  NA 138 140  K 4.1 3.9  CL 102 102  CO2 19* 21*  GLUCOSE 330* 365*  BUN 46* 50*  CREATININE 6.19* 6.07*  CALCIUM 8.9 9.5     Liver Function Tests:  Recent Labs Lab 10/07/15 1310 10/12/15 1040  AST 21 20  ALT 16 16  ALKPHOS 87 121  BILITOT 0.8 0.8  PROT 6.1* 6.8  ALBUMIN 3.5 3.7    Recent Labs Lab 10/07/15 1310 10/12/15 1040  LIPASE 22 22    CBG: Lab Results  Component Value Date   HGBA1C 6.9 07/12/2015    Recent Labs Lab 10/07/15 1656 10/12/15 1556  GLUCAP 281* >600*   Assessment & Plan by Problem:  Principal Problem:   DKA (diabetic ketoacidoses) (HCC) Active Problems:   Gastritis   CKD (chronic kidney disease), stage V ([facility])   Nausea with vomiting   Type 2 diabetes mellitus ([facility])   Hyperglycemia   1. DKA-- AG 17, glucose 365, CBG 3 hours later was >600. On lantus 12 units qd w/ novolog sliding scale w/ meals. Last a1c was 6.9 on 07/12/15 likely falsely low in setting of ESRD. She was seen on 10/6 w/ AG of 17 and had ketones in her urine at that time. Unable to get urine sample in the ED 2/2 low urine output in ESRD. Started on glucostablizer in the ED. No signs of infection to precipitate DKA.  - admit to SDU - NS w/6820mEq K at 75 cc/hr - continue glucostablizer protocol, when AG closes x 2 will transition to 7 units of lantus - checking hgb1c - UA when able to provide sample  2. Nausea/vomiting-- likely 2/2 #1, however she has been admitted several times for this that was thought to be  multifactorial from constipation, gastritis, and gastroparesis. She denies constipation and had a BM yesterday. She has not been taking a PPI or reglan, denies having a gastric emptying study. I-stat bhgc was 5.7 (nl <5.0) - IV pepcid 20mg  BID - reglan 10mg  q8h - NPO while on glucostabilizer - consider outpatient gastric emptying study - ordered serum beta hcg.   3. ESRD on MWF HD-- Missed her HD today as she was in the ED. Follows w/ [facility]. No acute indication for emergent HD today, BUN elevated but around what her baseline is, unlikely this is the cause of her n/v. - nephro following, will take her for HD tomorrow  4. HTN-- continue home clonidine 0.9mg  TID, cardizem 360mg , hydralazine 100mg .   F - NS w/57mEq K at 75 cc/hr E - replace K as needed N - NPO DVT Ppx hep [place] Code Status full  Dispo: Admit patient to Inpatient with expected length of stay greater than 2 midnights.  Signed: Eulah Pont, MD 10/12/2015, 4:28 PM  Pager: [phone]

## 2015-10-12 NOTE — ED Notes (Signed)
Asher MuirJamie, PA at bedside. Made aware of pt's BP.

## 2015-10-13 ENCOUNTER — Encounter (HOSPITAL_COMMUNITY): Payer: Self-pay | Admitting: Emergency Medicine

## 2015-10-13 DIAGNOSIS — Z992 Dependence on renal dialysis: Secondary | ICD-10-CM

## 2015-10-13 DIAGNOSIS — R112 Nausea with vomiting, unspecified: Secondary | ICD-10-CM

## 2015-10-13 DIAGNOSIS — E1122 Type 2 diabetes mellitus with diabetic chronic kidney disease: Secondary | ICD-10-CM

## 2015-10-13 DIAGNOSIS — N185 Chronic kidney disease, stage 5: Secondary | ICD-10-CM

## 2015-10-13 DIAGNOSIS — Z87891 Personal history of nicotine dependence: Secondary | ICD-10-CM

## 2015-10-13 DIAGNOSIS — Z8249 Family history of ischemic heart disease and other diseases of the circulatory system: Secondary | ICD-10-CM

## 2015-10-13 DIAGNOSIS — D649 Anemia, unspecified: Secondary | ICD-10-CM | POA: Diagnosis present

## 2015-10-13 DIAGNOSIS — N2581 Secondary hyperparathyroidism of renal origin: Secondary | ICD-10-CM | POA: Diagnosis present

## 2015-10-13 DIAGNOSIS — Z79899 Other long term (current) drug therapy: Secondary | ICD-10-CM

## 2015-10-13 DIAGNOSIS — I120 Hypertensive chronic kidney disease with stage 5 chronic kidney disease or end stage renal disease: Secondary | ICD-10-CM | POA: Diagnosis present

## 2015-10-13 DIAGNOSIS — Z833 Family history of diabetes mellitus: Secondary | ICD-10-CM

## 2015-10-13 DIAGNOSIS — Z794 Long term (current) use of insulin: Secondary | ICD-10-CM

## 2015-10-13 DIAGNOSIS — E1110 Type 2 diabetes mellitus with ketoacidosis without coma: Principal | ICD-10-CM | POA: Diagnosis present

## 2015-10-13 DIAGNOSIS — K219 Gastro-esophageal reflux disease without esophagitis: Secondary | ICD-10-CM | POA: Diagnosis present

## 2015-10-13 DIAGNOSIS — T383X6A Underdosing of insulin and oral hypoglycemic [antidiabetic] drugs, initial encounter: Secondary | ICD-10-CM | POA: Diagnosis present

## 2015-10-13 DIAGNOSIS — N186 End stage renal disease: Secondary | ICD-10-CM | POA: Diagnosis present

## 2015-10-13 LAB — BASIC METABOLIC PANEL
ANION GAP: 11 (ref 5–15)
ANION GAP: 11 (ref 5–15)
Anion gap: 14 (ref 5–15)
Anion gap: 12 (ref 5–15)
BUN: 59 mg/dL — AB (ref 6–20)
BUN: 62 mg/dL — AB (ref 6–20)
BUN: 64 mg/dL — AB (ref 6–20)
BUN: 64 mg/dL — AB (ref 6–20)
CHLORIDE: 105 mmol/L (ref 101–111)
CHLORIDE: 107 mmol/L (ref 101–111)
CHLORIDE: 103 mmol/L (ref 101–111)
CO2: 22 mmol/L (ref 22–32)
CO2: 22 mmol/L (ref 22–32)
CO2: 23 mmol/L (ref 22–32)
CO2: 24 mmol/L (ref 22–32)
CREATININE: 7.34 mg/dL — AB (ref 0.44–1.00)
Calcium: 8.2 mg/dL — ABNORMAL LOW (ref 8.9–10.3)
Calcium: 8.6 mg/dL — ABNORMAL LOW (ref 8.9–10.3)
Calcium: 8.7 mg/dL — ABNORMAL LOW (ref 8.9–10.3)
Calcium: 8.7 mg/dL — ABNORMAL LOW (ref 8.9–10.3)
Chloride: 103 mmol/L (ref 101–111)
Creatinine, Ser: 6.44 mg/dL — ABNORMAL HIGH (ref 0.44–1.00)
Creatinine, Ser: 6.7 mg/dL — ABNORMAL HIGH (ref 0.44–1.00)
Creatinine, Ser: 6.96 mg/dL — ABNORMAL HIGH (ref 0.44–1.00)
GFR calc Af Amer: 7 mL/min — ABNORMAL LOW (ref >60)
GFR calc non Af Amer: 6 mL/min — ABNORMAL LOW (ref >60)
GFR, EST AFRICAN AMERICAN: 7 mL/min — AB (ref >60)
GFR, EST AFRICAN AMERICAN: 8 mL/min — AB (ref >60)
GFR, EST AFRICAN AMERICAN: 8 mL/min — AB (ref >60)
GFR, EST NON AFRICAN AMERICAN: 6 mL/min — AB (ref >60)
GFR, EST NON AFRICAN AMERICAN: 7 mL/min — AB (ref >60)
GFR, EST NON AFRICAN AMERICAN: 7 mL/min — AB (ref >60)
GLUCOSE: 295 mg/dL — AB (ref 65–99)
Glucose, Bld: 104 mg/dL — ABNORMAL HIGH (ref 65–99)
Glucose, Bld: 183 mg/dL — ABNORMAL HIGH (ref 65–99)
Glucose, Bld: 373 mg/dL — ABNORMAL HIGH (ref 65–99)
POTASSIUM: 4 mmol/L (ref 3.5–5.1)
POTASSIUM: 4.2 mmol/L (ref 3.5–5.1)
POTASSIUM: 5.3 mmol/L — AB (ref 3.5–5.1)
Potassium: 4.7 mmol/L (ref 3.5–5.1)
SODIUM: 138 mmol/L (ref 135–145)
SODIUM: 139 mmol/L (ref 135–145)
SODIUM: 142 mmol/L (ref 135–145)
Sodium: 138 mmol/L (ref 135–145)

## 2015-10-13 LAB — GLUCOSE, CAPILLARY
GLUCOSE-CAPILLARY: 210 mg/dL — AB (ref 65–99)
GLUCOSE-CAPILLARY: 209 mg/dL — AB (ref 65–99)
GLUCOSE-CAPILLARY: 205 mg/dL — AB (ref 65–99)
Glucose-Capillary: 126 mg/dL — ABNORMAL HIGH (ref 65–99)
Glucose-Capillary: 161 mg/dL — ABNORMAL HIGH (ref 65–99)
Glucose-Capillary: 186 mg/dL — ABNORMAL HIGH (ref 65–99)
Glucose-Capillary: 298 mg/dL — ABNORMAL HIGH (ref 65–99)
Glucose-Capillary: 64 mg/dL — ABNORMAL LOW (ref 65–99)
Glucose-Capillary: 92 mg/dL (ref 65–99)

## 2015-10-13 LAB — COMPREHENSIVE METABOLIC PANEL
ALBUMIN: 3.1 g/dL — AB (ref 3.5–5.0)
ALK PHOS: 103 U/L (ref 38–126)
ALT: 26 U/L (ref 14–54)
ANION GAP: 19 — AB (ref 5–15)
AST: 34 U/L (ref 15–41)
BILIRUBIN TOTAL: 1.3 mg/dL — AB (ref 0.3–1.2)
BUN: 24 mg/dL — AB (ref 6–20)
CALCIUM: 8.3 mg/dL — AB (ref 8.9–10.3)
CO2: 17 mmol/L — ABNORMAL LOW (ref 22–32)
Chloride: 95 mmol/L — ABNORMAL LOW (ref 101–111)
Creatinine, Ser: 4.17 mg/dL — ABNORMAL HIGH (ref 0.44–1.00)
GFR calc Af Amer: 14 mL/min — ABNORMAL LOW (ref >60)
GFR, EST NON AFRICAN AMERICAN: 12 mL/min — AB (ref >60)
GLUCOSE: 571 mg/dL — AB (ref 65–99)
POTASSIUM: 3.7 mmol/L (ref 3.5–5.1)
Sodium: 131 mmol/L — ABNORMAL LOW (ref 135–145)
TOTAL PROTEIN: 6.3 g/dL — AB (ref 6.5–8.1)

## 2015-10-13 LAB — HEMOGLOBIN A1C
Hgb A1c MFr Bld: 7 % — ABNORMAL HIGH (ref 4.8–5.6)
MEAN PLASMA GLUCOSE: 154 mg/dL

## 2015-10-13 LAB — CBC WITH DIFFERENTIAL/PLATELET
Basophils Absolute: 0 10*3/uL (ref 0.0–0.1)
Basophils Relative: 1 %
Eosinophils Absolute: 0.4 10*3/uL (ref 0.0–0.7)
Eosinophils Relative: 5 %
HEMATOCRIT: 35.2 % — AB (ref 36.0–46.0)
HEMOGLOBIN: 11.2 g/dL — AB (ref 12.0–15.0)
LYMPHS PCT: 16 %
Lymphs Abs: 1.3 10*3/uL (ref 0.7–4.0)
MCH: 27.7 pg (ref 26.0–34.0)
MCHC: 31.8 g/dL (ref 30.0–36.0)
MCV: 87.1 fL (ref 78.0–100.0)
MONO ABS: 0.5 10*3/uL (ref 0.1–1.0)
MONOS PCT: 7 %
NEUTROS ABS: 5.5 10*3/uL (ref 1.7–7.7)
NEUTROS PCT: 71 %
Platelets: 191 10*3/uL (ref 150–400)
RBC: 4.04 MIL/uL (ref 3.87–5.11)
RDW: 16.1 % — AB (ref 11.5–15.5)
WBC: 7.8 10*3/uL (ref 4.0–10.5)

## 2015-10-13 LAB — CBG MONITORING, ED: Glucose-Capillary: 516 mg/dL (ref 65–99)

## 2015-10-13 LAB — HEPATITIS B SURFACE ANTIGEN: Hepatitis B Surface Ag: NEGATIVE

## 2015-10-13 MED ORDER — SODIUM CHLORIDE 0.9 % IV SOLN: 125.0000 mg | INTRAVENOUS | Status: DC

## 2015-10-13 MED ORDER — CALCITRIOL 0.5 MCG PO CAPS: 0.5000 ug | ORAL_CAPSULE | ORAL | Status: DC

## 2015-10-13 MED ORDER — INSULIN GLARGINE 100 UNIT/ML ~~LOC~~ SOLN
7.0000 [IU] | Freq: Once | SUBCUTANEOUS | Status: AC
  Administered 2015-10-13: 7 [IU] via SUBCUTANEOUS
  Filled 2015-10-13: qty 0.07

## 2015-10-13 MED ORDER — DOCUSATE SODIUM 100 MG PO CAPS
400.0000 mg | ORAL_CAPSULE | Freq: Every day | ORAL | Status: DC
  Administered 2015-10-13: 400 mg via ORAL
  Filled 2015-10-13: qty 4

## 2015-10-13 MED ORDER — CALCITRIOL 0.5 MCG PO CAPS
ORAL_CAPSULE | ORAL | Status: AC
Start: 2015-10-13 — End: 2015-10-13
  Administered 2015-10-13: 0.5 ug via ORAL
  Filled 2015-10-13: qty 1

## 2015-10-13 MED ORDER — SODIUM CHLORIDE 0.9 % IV SOLN
125.0000 mg | Freq: Once | INTRAVENOUS | Status: AC
  Administered 2015-10-13: 125 mg via INTRAVENOUS
  Filled 2015-10-13 (×2): qty 10

## 2015-10-13 MED ORDER — DOCUSATE SODIUM 100 MG PO CAPS: 400.0000 mg | ORAL_CAPSULE | Freq: Every day | ORAL | 0 refills | Status: DC

## 2015-10-13 MED ORDER — CALCITRIOL 0.5 MCG PO CAPS
0.5000 ug | ORAL_CAPSULE | Freq: Once | ORAL | Status: AC
  Administered 2015-10-13: 0.5 ug via ORAL
  Filled 2015-10-13: qty 1

## 2015-10-13 MED ORDER — INSULIN GLARGINE 100 UNIT/ML ~~LOC~~ SOLN
7.0000 [IU] | Freq: Every day | SUBCUTANEOUS | Status: DC
  Filled 2015-10-13: qty 0.07

## 2015-10-13 MED ORDER — INSULIN ASPART 100 UNIT/ML ~~LOC~~ SOLN
0.0000 [IU] | SUBCUTANEOUS | Status: DC
  Administered 2015-10-13: 3 [IU] via SUBCUTANEOUS
  Administered 2015-10-13: 5 [IU] via SUBCUTANEOUS
  Administered 2015-10-13: 3 [IU] via SUBCUTANEOUS

## 2015-10-13 NOTE — Plan of Care (Signed)
Problem: Safety: Goal: Ability to remain free from injury will improve Outcome: Completed/Met Date Met: 10/13/15 Pt is able to state when needing help and to get out of bed. Pt room is free and clear of debris

## 2015-10-13 NOTE — Progress Notes (Signed)
   Subjective: Ms. Genelle BalCrystal Hartwell said her abdominal pain, nausea, and vomiting feel improved today. She thinks that her pain was related to constipation which she was experiencing last week. She had stopped taking her stool softener for 1 week and thinks that this is related. She says her appetite was decreased for the last week as well.   Objective:  Vital signs in last 24 hours: Vitals:   10/13/15 1335 10/13/15 1400 10/13/15 1430 10/13/15 1500  BP: (!) 157/66 (!) 159/69 (!) 148/69 (!) 157/70  Pulse: 61 63 68 77  Resp: 20 20 12 19   Temp:      TempSrc:      SpO2:      Weight:      Height:       Physical Exam  Constitutional: She appears well-developed and well-nourished. No distress.  Cardiovascular: Normal rate and regular rhythm.   No murmur heard. Pulmonary/Chest: She has no wheezes. She has no rales.  Abdominal: Soft. She exhibits no distension. There is no tenderness.  Extremities: no calf tenderness, no peripheral edema   Medications: Infusions: . dextrose 5 % and 0.45% NaCl Stopped (10/13/15 0626)   Scheduled Medications: . calcitRIOL  0.5 mcg Oral Once in dialysis  . [START ON 10/14/2015] calcitRIOL  0.5 mcg Oral Q M,W,F-HD  . cloNIDine  0.3 mg Oral TID  . diltiazem  360 mg Oral Daily  . docusate sodium  400 mg Oral Daily  . famotidine (PEPCID) IV  20 mg Intravenous Q24H  . ferric gluconate (FERRLECIT/NULECIT) IV  125 mg Intravenous Once in dialysis  . [START ON 10/14/2015] ferric gluconate (FERRLECIT/NULECIT) IV  125 mg Intravenous Q M,W,F-HD  . heparin  5,000 Units Subcutaneous Q8H  . hydrALAZINE  100 mg Oral TID  . insulin aspart  0-9 Units Subcutaneous Q4H  . insulin glargine  7 Units Subcutaneous QHS  . metoCLOPramide  5 mg Oral Q8H  . multivitamin  1 tablet Oral QHS   PRN Medications: ondansetron (ZOFRAN) IV  Assessment/Plan:    DKA (diabetic ketoacidoses) (HCC) Type 2 diabetes mellitus  Mrs. Sherrine MaplesGlenn is a 45 year old woman with a past medical history of  type 2 diabetes mellitus diagnosed at age 45 he who has had previous hospitalizations for DKA in the past. She presented with nausea, vomiting, and abdominal pain and was found to have blood sugar greater than 600 with anion gap 17 in the emergency department. Overnight she was given IV fluids and insulin and her anion gap improved and so did her symptoms . We suspect that the symptoms may have been related to diabetic ketoacidosis and may have had a component of starvation ketoacidosis.    CKD (chronic kidney disease), stage V (HCC) She is euvolemic on exam and compliant with Monday Wednesday Friday dialysis. She has missed dialysis yesterday so we will dialyze her today.    Nausea with vomiting   Constipation She has a history of chronic constipation which is well-controlled with Colace 400 mg daily at home. She missed some doses of Colace and developed constipation over the past few weeks prior to presentation. She has a history of hospitalization for nausea and vomiting and things that episodes may be related to her chronic constipation. Yesterday she had a bowel movement and she found that her symptoms improved back to baseline today.Today he we have started her home medication Colace.  Dispo: Anticipated discharge today   LOS: 0 days   Eulah PontNina Kailey Esquilin, MD 10/13/2015, 3:28 PM Pager: [phone]

## 2015-10-13 NOTE — Progress Notes (Signed)
Patient ID: Crystal Tanner, female   DOB: 1970-01-24, 45 y.o.   MRN: [id number]    Date of Admission:  10/12/2015     Principal Problem:   DKA (diabetic ketoacidoses) (HCC) Active Problems:   Gastritis   CKD (chronic kidney disease), stage V (HCC)   Constipation   Nausea with vomiting   Type 2 diabetes mellitus (HCC)   Hyperglycemia   . calcitRIOL  0.5 mcg Oral Once in dialysis  . [START ON 10/14/2015] calcitRIOL  0.5 mcg Oral Q M,W,F-HD  . cloNIDine  0.3 mg Oral TID  . diltiazem  360 mg Oral Daily  . docusate sodium  400 mg Oral Daily  . famotidine (PEPCID) IV  20 mg Intravenous Q24H  . ferric gluconate (FERRLECIT/NULECIT) IV  125 mg Intravenous Once in dialysis  . [START ON 10/14/2015] ferric gluconate (FERRLECIT/NULECIT) IV  125 mg Intravenous Q M,W,F-HD  . heparin  5,000 Units Subcutaneous Q8H  . hydrALAZINE  100 mg Oral TID  . insulin aspart  0-9 Units Subcutaneous Q4H  . insulin glargine  7 Units Subcutaneous QHS  . metoCLOPramide  5 mg Oral Q8H  . multivitamin  1 tablet Oral QHS    SUBJECTIVE: Crystal Tanner is a 45 year old with a history of type 2 diabetes since age 45. She has a remote history of 2 episodes of DKA requiring hospitalization. She monitors her blood sugars at home and they were normal until yesterday when it went up to over 500. She has been bothered by recent constipation, nausea and vomiting. She has not been eating very much for the past week. She restarted her stool softener about a 1 week ago and had a bowel movement yesterday. She was admitted with a blood sugar in the 3-400 range and a slight increase in her anion gap. Her gap closed and her sugars normalized quickly with IV fluids and insulin. She has not had any further nausea or vomiting and is feeling back to her normal baseline.  Review of Systems: Review of Systems  Constitutional: Positive for malaise/fatigue. Negative for chills, diaphoresis, fever and weight loss.  Respiratory: Negative for cough,  sputum production and shortness of breath.   Cardiovascular: Negative for chest pain.  Gastrointestinal: Positive for constipation, nausea and vomiting. Negative for abdominal pain and diarrhea.    Past Medical History:  Diagnosis Date  . Acute kidney injury (HCC)   . Anemia   . Chronic kidney disease   . Constipation   . Depression   . Diabetes mellitus without complication (HCC) 1991   Type II  . Gastritis   . GERD (gastroesophageal reflux disease)   . Heart murmur    as a child- doesnt remember having echo.  . Hemorrhoid   . Hypertension   . Renal cyst, left   . Shortness of breath dyspnea    with exertion    Social History  Substance Use Topics  . Smoking status: Former Smoker    Packs/day: 0.25    Years: 15.00    Quit date: 08/14/2015  . Smokeless tobacco: Never Used  . Alcohol use No     Comment: less than a ppd, "not much"    Family History  Problem Relation Age of Onset  . Hypertension Mother   . Diabetes Mother    Allergies  Allergen Reactions  . No Known Allergies     OBJECTIVE: Vitals:   10/13/15 1103 10/13/15 1104 10/13/15 1200 10/13/15 1334  BP: (!) 170/72 (!) 170/72 (!) 164/67 (!) 157/66  Pulse:   60 61  Resp:    20  Temp:   98.3 F (36.8 C)   TempSrc:   Oral   SpO2:   100%   Weight:      Height:       Body mass index is 27.41 kg/m.  Physical Exam  Constitutional: She is oriented to person, place, and time.  She is in good spirits. She is sitting on the side of the bed.  Cardiovascular: Normal rate and regular rhythm.   No murmur heard. Pulmonary/Chest: Effort normal and breath sounds normal.  Abdominal: Soft. There is no tenderness.  Neurological: She is alert and oriented to person, place, and time.  Skin: No rash noted.  Psychiatric: Mood and affect normal.    Lab Results Lab Results  Component Value Date   WBC 9.6 10/12/2015   HGB 12.3 10/12/2015   HCT 35.7 (L) 10/12/2015   MCV 86.0 10/12/2015   PLT 193 10/12/2015      Lab Results  Component Value Date   CREATININE 7.34 (H) 10/13/2015   BUN 64 (H) 10/13/2015   NA 138 10/13/2015   K 4.7 10/13/2015   CL 103 10/13/2015   CO2 23 10/13/2015    Lab Results  Component Value Date   ALT 16 10/12/2015   AST 20 10/12/2015   ALKPHOS 121 10/12/2015   BILITOT 0.8 10/12/2015     Microbiology: Recent Results (from the past 240 hour(s))  MRSA PCR Screening     Status: None   Collection Time: 10/12/15  6:36 PM  Result Value Ref Range Status   MRSA by PCR NEGATIVE NEGATIVE Final    Comment:        The GeneXpert MRSA Assay (FDA approved for NASAL specimens only), is one component of a comprehensive MRSA colonization surveillance program. It is not intended to diagnose MRSA infection nor to guide or monitor treatment for MRSA infections.      ASSESSMENT: She believes her nausea and vomiting was due to her constipation and that she is feeling better because she had a good bowel movement yesterday. She developed hyperglycemia and a mild elevated anion gap metabolic acidosis. This may have been due to early DKA and some starvation ketosis. All of this has normalized now.  PLAN: 1. Encouraged to stay on her stool softeners 2. Discharge home on home dose of insulin  Cliffton AstersJohn Lizzie Cokley, MD [facility] [phone] pager   [phone] cell 10/13/2015, 1:57 PM

## 2015-10-13 NOTE — Progress Notes (Addendum)
After arriving home pt father called to unit to speak with RN at 2135 stating that pt checked her blood sugar and it was 418.  RN informed father that pt stated she had medication at home and would take whatever she needed when she gets there.  Pt then got on the phone and stated "I had a zip lock bag with 4 insulin pens and I can't  find them".  RN then asked pt a series of questions to possibly back track of where they could be. Pt stated her insulin pens where on the table at home when EMS came to pick her up.  RN called to ED and Pharmacy just to see if pt insulin pens were found.  RN also informed pt that her insulin pens where not documented on admission. Pt stated she didn't know what to do at this point.  RN then informed pt she would need to return to the Emergency Room for evaluation.

## 2015-10-13 NOTE — Progress Notes (Addendum)
Pt A&Ox4 sitting up in bed. No acute distress noted. Discharge instructions read and given to patient. Pt verbalize understanding. Pt is aware of elevated BP and was informed that she will be medicated prior to leaving. Pt stated she doesn't need any medication and can give herself whatever medication needed when she gets home.  Pt states "I am ready to go home and can't stay another night in here". Tele and IV removed. Pt taken to main lobby via wheelchair by Racheal, NT where pt father is waiting to escort pt home via car.

## 2015-10-13 NOTE — ED Triage Notes (Signed)
Patient arrived with EMS from home reports elevated blood sugar at home this evening = 414 , she was just admitted yesterday / discharged today for hyperglycemia . Asymptomatic at arrival , respirations unlabored / no nausea .

## 2015-10-13 NOTE — Progress Notes (Addendum)
[facility] Brief Note  Notified of pt's admission for DKA and Dr. Orvan Falconerampbell indicates is to be discharged today Dialysis orders written for today (missed her treatment yesterday) If discharged, then will ALSO need to attend her regular treatment tomorrow.  Camille Balynthia Ko Bardon, MD [facility] [phone] Pager 10/13/2015, 9:26 AM

## 2015-10-13 NOTE — Procedures (Signed)
I have personally attended this patient's dialysis session.   [facility], 2K bath Pre HD weight 76.9 with EDW 74 kg.  Camille Balynthia Alaina Donati, MD [facility] [phone] Pager 10/13/2015, 1:33 PM

## 2015-10-13 NOTE — Progress Notes (Signed)
Inpatient Diabetes Program Recommendations  AACE/ADA: New Consensus Statement on Inpatient Glycemic Control (2015)  Target Ranges:  Prepandial:   less than 140 mg/dL      Peak postprandial:   less than 180 mg/dL (1-2 hours)      Critically ill patients:  140 - 180 mg/dL   Lab Results  Component Value Date   GLUCAP 205 (H) 10/13/2015   HGBA1C 7.0 (H) 10/12/2015    Review of Glycemic Control  Diabetes history: DM2, sensitive to insulin due to renal function Outpatient Diabetes medications: Novolog sliding scale, Lantus 12 units QHS Current orders for Inpatient glycemic control: Novolog 0-9 units Q4H, Lantus 7 units QHS  Inpatient Diabetes Program Recommendations: Agree with plan. Will continue to monitor as patient is transitioned off IV insulin and begins SQ SSI.  Thank you,  Kristine LineaKaren Keivon Garden, RN, BSN Diabetes Coordinator Inpatient Diabetes Program [phone] (Team Pager) [phone] ([facility] office) [phone] [facility] office) [phone] [facility] office)

## 2015-10-14 ENCOUNTER — Encounter (HOSPITAL_COMMUNITY): Payer: Self-pay

## 2015-10-14 ENCOUNTER — Inpatient Hospital Stay (HOSPITAL_COMMUNITY)
Admission: EM | Admit: 2015-10-14 | Discharge: 2015-10-15 | DRG: 637 | Disposition: A | Discharge status: Home / Self Care | Payer: Medicaid Other | Attending: Internal Medicine | Admitting: Internal Medicine

## 2015-10-14 DIAGNOSIS — N186 End stage renal disease: Secondary | ICD-10-CM | POA: Diagnosis present

## 2015-10-14 DIAGNOSIS — Z87891 Personal history of nicotine dependence: Secondary | ICD-10-CM | POA: Diagnosis not present

## 2015-10-14 DIAGNOSIS — I120 Hypertensive chronic kidney disease with stage 5 chronic kidney disease or end stage renal disease: Secondary | ICD-10-CM

## 2015-10-14 DIAGNOSIS — E1110 Type 2 diabetes mellitus with ketoacidosis without coma: Secondary | ICD-10-CM | POA: Diagnosis present

## 2015-10-14 DIAGNOSIS — K219 Gastro-esophageal reflux disease without esophagitis: Secondary | ICD-10-CM | POA: Diagnosis present

## 2015-10-14 DIAGNOSIS — Z8249 Family history of ischemic heart disease and other diseases of the circulatory system: Secondary | ICD-10-CM | POA: Diagnosis not present

## 2015-10-14 DIAGNOSIS — E0810 Diabetes mellitus due to underlying condition with ketoacidosis without coma: Secondary | ICD-10-CM | POA: Diagnosis present

## 2015-10-14 DIAGNOSIS — Z992 Dependence on renal dialysis: Secondary | ICD-10-CM

## 2015-10-14 DIAGNOSIS — Z833 Family history of diabetes mellitus: Secondary | ICD-10-CM | POA: Diagnosis not present

## 2015-10-14 DIAGNOSIS — D649 Anemia, unspecified: Secondary | ICD-10-CM | POA: Diagnosis present

## 2015-10-14 DIAGNOSIS — E1122 Type 2 diabetes mellitus with diabetic chronic kidney disease: Secondary | ICD-10-CM

## 2015-10-14 DIAGNOSIS — T383X6A Underdosing of insulin and oral hypoglycemic [antidiabetic] drugs, initial encounter: Secondary | ICD-10-CM | POA: Diagnosis present

## 2015-10-14 DIAGNOSIS — Z79899 Other long term (current) drug therapy: Secondary | ICD-10-CM

## 2015-10-14 DIAGNOSIS — I10 Essential (primary) hypertension: Secondary | ICD-10-CM

## 2015-10-14 DIAGNOSIS — Z794 Long term (current) use of insulin: Secondary | ICD-10-CM

## 2015-10-14 DIAGNOSIS — N2581 Secondary hyperparathyroidism of renal origin: Secondary | ICD-10-CM | POA: Diagnosis present

## 2015-10-14 LAB — CBG MONITORING, ED
GLUCOSE-CAPILLARY: 584 mg/dL — AB (ref 65–99)
GLUCOSE-CAPILLARY: 563 mg/dL — AB (ref 65–99)
GLUCOSE-CAPILLARY: 457 mg/dL — AB (ref 65–99)
GLUCOSE-CAPILLARY: 394 mg/dL — AB (ref 65–99)
GLUCOSE-CAPILLARY: 276 mg/dL — AB (ref 65–99)
GLUCOSE-CAPILLARY: 171 mg/dL — AB (ref 65–99)

## 2015-10-14 LAB — BASIC METABOLIC PANEL
ANION GAP: 11 (ref 5–15)
Anion gap: 15 (ref 5–15)
Anion gap: 13 (ref 5–15)
Anion gap: 12 (ref 5–15)
BUN: 43 mg/dL — AB (ref 6–20)
BUN: 34 mg/dL — AB (ref 6–20)
BUN: 36 mg/dL — AB (ref 6–20)
BUN: 39 mg/dL — AB (ref 6–20)
CALCIUM: 8.7 mg/dL — AB (ref 8.9–10.3)
CO2: 20 mmol/L — ABNORMAL LOW (ref 22–32)
CO2: 25 mmol/L (ref 22–32)
CO2: 24 mmol/L (ref 22–32)
CO2: 26 mmol/L (ref 22–32)
CREATININE: 4.71 mg/dL — AB (ref 0.44–1.00)
CREATININE: 5 mg/dL — AB (ref 0.44–1.00)
Calcium: 9.6 mg/dL (ref 8.9–10.3)
Calcium: 9.3 mg/dL (ref 8.9–10.3)
Calcium: 8.8 mg/dL — ABNORMAL LOW (ref 8.9–10.3)
Chloride: 103 mmol/L (ref 101–111)
Chloride: 101 mmol/L (ref 101–111)
Chloride: 100 mmol/L — ABNORMAL LOW (ref 101–111)
Chloride: 99 mmol/L — ABNORMAL LOW (ref 101–111)
Creatinine, Ser: 4.62 mg/dL — ABNORMAL HIGH (ref 0.44–1.00)
Creatinine, Ser: 5.26 mg/dL — ABNORMAL HIGH (ref 0.44–1.00)
GFR calc Af Amer: 12 mL/min — ABNORMAL LOW (ref >60)
GFR calc Af Amer: 12 mL/min — ABNORMAL LOW (ref >60)
GFR calc Af Amer: 11 mL/min — ABNORMAL LOW (ref >60)
GFR calc Af Amer: 10 mL/min — ABNORMAL LOW (ref >60)
GFR, EST NON AFRICAN AMERICAN: 11 mL/min — AB (ref >60)
GFR, EST NON AFRICAN AMERICAN: 10 mL/min — AB (ref >60)
GFR, EST NON AFRICAN AMERICAN: 10 mL/min — AB (ref >60)
GFR, EST NON AFRICAN AMERICAN: 9 mL/min — AB (ref >60)
GLUCOSE: 161 mg/dL — AB (ref 65–99)
Glucose, Bld: 202 mg/dL — ABNORMAL HIGH (ref 65–99)
Glucose, Bld: 62 mg/dL — ABNORMAL LOW (ref 65–99)
Glucose, Bld: 191 mg/dL — ABNORMAL HIGH (ref 65–99)
POTASSIUM: 6.2 mmol/L — AB (ref 3.5–5.1)
POTASSIUM: 3.8 mmol/L (ref 3.5–5.1)
POTASSIUM: 3.6 mmol/L (ref 3.5–5.1)
POTASSIUM: 3.6 mmol/L (ref 3.5–5.1)
SODIUM: 138 mmol/L (ref 135–145)
SODIUM: 139 mmol/L (ref 135–145)
SODIUM: 136 mmol/L (ref 135–145)
Sodium: 136 mmol/L (ref 135–145)

## 2015-10-14 LAB — GLUCOSE, CAPILLARY
GLUCOSE-CAPILLARY: 61 mg/dL — AB (ref 65–99)
GLUCOSE-CAPILLARY: 164 mg/dL — AB (ref 65–99)
GLUCOSE-CAPILLARY: 228 mg/dL — AB (ref 65–99)
GLUCOSE-CAPILLARY: 206 mg/dL — AB (ref 65–99)
GLUCOSE-CAPILLARY: 199 mg/dL — AB (ref 65–99)
GLUCOSE-CAPILLARY: 71 mg/dL (ref 65–99)
Glucose-Capillary: 78 mg/dL (ref 65–99)
Glucose-Capillary: 98 mg/dL (ref 65–99)
Glucose-Capillary: 184 mg/dL — ABNORMAL HIGH (ref 65–99)
Glucose-Capillary: 161 mg/dL — ABNORMAL HIGH (ref 65–99)
Glucose-Capillary: 149 mg/dL — ABNORMAL HIGH (ref 65–99)
Glucose-Capillary: 103 mg/dL — ABNORMAL HIGH (ref 65–99)
Glucose-Capillary: 183 mg/dL — ABNORMAL HIGH (ref 65–99)

## 2015-10-14 LAB — URINALYSIS, ROUTINE W REFLEX MICROSCOPIC
BILIRUBIN URINE: NEGATIVE
Glucose, UA: >1000 mg/dL — AB
KETONES UR: 15 mg/dL — AB
Leukocytes, UA: NEGATIVE
NITRITE: NEGATIVE
PH: 5.5 (ref 5.0–8.0)
Protein, ur: >300 mg/dL — AB
SPECIFIC GRAVITY, URINE: 1.019 (ref 1.005–1.030)

## 2015-10-14 LAB — URINE MICROSCOPIC-ADD ON

## 2015-10-14 LAB — PHOSPHORUS: Phosphorus: 3.9 mg/dL (ref 2.5–4.6)

## 2015-10-14 MED ORDER — SODIUM BICARBONATE 650 MG PO TABS
650.0000 mg | ORAL_TABLET | Freq: Three times a day (TID) | ORAL | Status: DC
  Filled 2015-10-14: qty 1

## 2015-10-14 MED ORDER — SODIUM CHLORIDE 0.9 % IV SOLN: INTRAVENOUS | Status: DC

## 2015-10-14 MED ORDER — POTASSIUM CHLORIDE 10 MEQ/100ML IV SOLN: 10.0000 meq | INTRAVENOUS | Status: DC

## 2015-10-14 MED ORDER — CALCITRIOL 0.25 MCG PO CAPS
0.5000 ug | ORAL_CAPSULE | ORAL | Status: DC
  Administered 2015-10-14: 0.5 ug via ORAL

## 2015-10-14 MED ORDER — SODIUM CHLORIDE 0.9 % IV SOLN
INTRAVENOUS | Status: DC
  Administered 2015-10-14: 5.2 [IU]/h via INTRAVENOUS
  Filled 2015-10-14: qty 2.5

## 2015-10-14 MED ORDER — INSULIN GLARGINE 100 UNIT/ML ~~LOC~~ SOLN
12.0000 [IU] | Freq: Once | SUBCUTANEOUS | Status: AC
Start: 2015-10-14 — End: 2015-10-14
  Administered 2015-10-14: 12 [IU] via SUBCUTANEOUS
  Filled 2015-10-14: qty 0.12

## 2015-10-14 MED ORDER — DEXTROSE 50 % IV SOLN
INTRAVENOUS | Status: AC
  Administered 2015-10-14: 16 mL
  Filled 2015-10-14: qty 50

## 2015-10-14 MED ORDER — HYDRALAZINE HCL 50 MG PO TABS
100.0000 mg | ORAL_TABLET | Freq: Three times a day (TID) | ORAL | Status: DC
Start: 2015-10-14 — End: 2015-10-15
  Administered 2015-10-14 – 2015-10-15 (×2): 100 mg via ORAL
  Filled 2015-10-14 (×2): qty 2

## 2015-10-14 MED ORDER — CLONIDINE HCL 0.2 MG PO TABS
0.3000 mg | ORAL_TABLET | Freq: Three times a day (TID) | ORAL | Status: DC
  Administered 2015-10-14 – 2015-10-15 (×3): 0.3 mg via ORAL
  Filled 2015-10-14 (×3): qty 1

## 2015-10-14 MED ORDER — SODIUM CHLORIDE 0.9 % IV SOLN: INTRAVENOUS | Status: AC

## 2015-10-14 MED ORDER — DEXTROSE-NACL 5-0.45 % IV SOLN: INTRAVENOUS | Status: DC

## 2015-10-14 MED ORDER — DEXTROSE-NACL 5-0.45 % IV SOLN
INTRAVENOUS | Status: AC
  Administered 2015-10-14: 10:00:00 via INTRAVENOUS

## 2015-10-14 MED ORDER — HEPARIN SODIUM (PORCINE) 5000 UNIT/ML IJ SOLN
5000.0000 [IU] | Freq: Three times a day (TID) | INTRAMUSCULAR | Status: DC
  Filled 2015-10-14 (×2): qty 1

## 2015-10-14 MED ORDER — CALCITRIOL 0.25 MCG PO CAPS
0.2500 ug | ORAL_CAPSULE | ORAL | Status: DC
  Administered 2015-10-14: 0.25 ug via ORAL
  Filled 2015-10-14: qty 1

## 2015-10-14 MED ORDER — SODIUM CHLORIDE 0.9 % IV BOLUS (SEPSIS): 1000.0000 mL | Freq: Once | INTRAVENOUS | Status: DC

## 2015-10-14 MED ORDER — INSULIN ASPART 100 UNIT/ML ~~LOC~~ SOLN
0.0000 [IU] | SUBCUTANEOUS | Status: DC
  Administered 2015-10-15: 1 [IU] via SUBCUTANEOUS

## 2015-10-14 MED ORDER — DOCUSATE SODIUM 100 MG PO CAPS
400.0000 mg | ORAL_CAPSULE | Freq: Every day | ORAL | Status: DC
  Administered 2015-10-14 – 2015-10-15 (×2): 400 mg via ORAL
  Filled 2015-10-14 (×2): qty 4

## 2015-10-14 MED ORDER — METOCLOPRAMIDE HCL 10 MG PO TABS: 10.0000 mg | ORAL_TABLET | Freq: Three times a day (TID) | ORAL | Status: DC | PRN

## 2015-10-14 MED ORDER — SODIUM CHLORIDE 0.9 % IV SOLN
125.0000 mg | INTRAVENOUS | Status: DC
  Administered 2015-10-14: 125 mg via INTRAVENOUS
  Filled 2015-10-14 (×3): qty 10

## 2015-10-14 MED ORDER — SODIUM CHLORIDE 0.9 % IV SOLN
INTRAVENOUS | Status: AC
  Administered 2015-10-14: 4.5 [IU]/h via INTRAVENOUS
  Filled 2015-10-14: qty 2.5

## 2015-10-14 MED ORDER — DILTIAZEM HCL ER COATED BEADS 120 MG PO CP24
360.0000 mg | ORAL_CAPSULE | Freq: Every day | ORAL | Status: DC
  Administered 2015-10-14: 360 mg via ORAL
  Filled 2015-10-14 (×2): qty 3

## 2015-10-14 MED ORDER — RENA-VITE PO TABS
1.0000 | ORAL_TABLET | Freq: Every day | ORAL | Status: DC
  Administered 2015-10-14: 1 via ORAL
  Filled 2015-10-14: qty 1

## 2015-10-14 MED ORDER — SODIUM CHLORIDE 0.9 % IV BOLUS (SEPSIS)
1000.0000 mL | Freq: Once | INTRAVENOUS | Status: DC
  Administered 2015-10-14: 1000 mL via INTRAVENOUS

## 2015-10-14 MED ORDER — ONDANSETRON 8 MG PO TBDP
8.0000 mg | ORAL_TABLET | Freq: Three times a day (TID) | ORAL | Status: DC | PRN
  Filled 2015-10-14: qty 1

## 2015-10-14 MED ORDER — WHITE PETROLATUM GEL
Status: AC
  Administered 2015-10-14: 11:00:00
  Filled 2015-10-14: qty 1

## 2015-10-14 MED FILL — LANTUS SOLOSTAR 100 UNITS/M: 100 | 25 days supply | Qty: 3 | Fill #4

## 2015-10-14 MED FILL — TRUEPLUS SYR 0.3ML 31GX5/16: 31G X 5/16" | 25 days supply | Qty: 100 | Fill #0

## 2015-10-14 NOTE — Progress Notes (Signed)
   Subjective: Ms. Crystal Tanner was seen in the stepdown unit this morning. She denied nausea or vomiting but was very upset and hungry. She stated that she was so hungry that she was about to to "the the hospital to fill up her belly and come back to the ED".  Objective:  Vital signs in last 24 hours: Vitals:   10/14/15 0925 10/14/15 1112 10/14/15 1129 10/14/15 1140  BP: 137/61 (!) 165/81    Pulse: 65 (!) 58 60 64  Resp: 20 14 15 16   Temp: 98.7 F (37.1 C) 98 F (36.7 C)    TempSrc: Oral Axillary    SpO2: 100% 99% 100% 100%  Weight: 174 lb (78.9 kg)     Height: 5\' 7"  (1.702 m)      Physical Exam  Constitutional: She appears well-developed and well-nourished. No distress.  Cardiovascular: Normal rate and regular rhythm.   No murmur heard. Pulmonary/Chest: She has no wheezes. She has no rales.  Abdominal: Soft. She exhibits no distension. There is no tenderness.  Extremities: no calf tenderness, no peripheral edema   Medications: Infusions: . dextrose 5 % and 0.45% NaCl 75 mL/hr at 10/14/15 0950  . insulin (NOVOLIN-R) infusion 1 Units/hr (10/14/15 1239)   Scheduled Medications: . calcitRIOL  0.5 mcg Oral Q M,W,F-HD  . cloNIDine  0.3 mg Oral TID  . diltiazem  360 mg Oral Daily  . docusate sodium  400 mg Oral Daily  . ferric gluconate (FERRLECIT/NULECIT) IV  125 mg Intravenous Q M,W,F-HD  . heparin  5,000 Units Subcutaneous Q8H  . hydrALAZINE  100 mg Oral TID   PRN Medications: metoCLOPramide, ondansetron  Assessment/Plan:   DKA (diabetic ketoacidoses) (HCC) This morning she was threatening to leave AMA because she had been NPO for DKA protocol since admission overnight, she still had an anion gap but we started her renal diet in an attempt to keep her in the hospital. At this time we do not know the cause of her sudden increase in insulin resistance and recurrent diabetic ketoacidosis. She does have a history of frequent diabetic ketoacidosis after she was diagnosed at  age 45. Renal failure can lead to some degree of insulin resistance but also is related to decreased insulin clearance. She will most likely require a higher basal insulin dose on discharge and may need to start using moderate sliding scale at home. We will continue to evaluate much insulin she is needing as we transition her off of the insulin drip to subq. We have called in a prescription for her NovoLog, Lantus, [facility]    ESRD on dialysis [facility](HCC) We will hemodialyzed her today.    Essential hypertension We have continued her home medication hydralazine 100 mg 3 times daily.  Dispo: Anticipated discharge in approximately 1-2 day(s).   LOS: 0 days   Crystal PontNina Clayson Riling, MD 10/14/2015, 1:04 PM Pager: [phone]

## 2015-10-14 NOTE — Progress Notes (Signed)
[facility] Brief Note  Pt here again (under observation status) for DKA (just sent out yesterday, couldn't find her insulin at home so back with BS 500's and gap acidosis).  PMH  ESRD (MWF [facility]), IDDM, HTN.  Currently TDF for HD (AVF placed 06/27/15 LUE BVT - had infiltration in center - resting for now) Had HD here yesterday off schedule and today is her usual day so will HD according to her usual prescription.  MWF [facility] 4.25 hours 2K 2.25 Ca EDW 74 TDC (AVF recent infilt resting) Heparin 7800 Venofer 100 Calcitriol 0.5 mcg  Basic Metabolic Panel:  Recent Labs  16/10/9608/12/17 2308 10/14/15 0755  NA 131* 138  K 3.7 6.2*  CL 95* 103  CO2 17* 20*  GLUCOSE 571* 202*  BUN 24* 43*  CREATININE 4.17* 4.62*  CALCIUM 8.3* 9.6  PHOS  --  3.9    Recent Labs  10/12/15 1040 10/13/15 2308  WBC 9.6 7.8  NEUTROABS 7.5 5.5  HGB 12.3 11.2*  HCT 35.7* 35.2*  MCV 86.0 87.1  PLT 193 191     Camille Balynthia Dain Laseter, MD [facility] [phone] Pager 10/14/2015, 11:10 AM

## 2015-10-14 NOTE — Progress Notes (Signed)
Patient ID: Crystal Tanner, female   DOB: 1970/09/02, 45 y.o.   MRN: [id number]      Date of Admission:  10/14/2015     Crystal Tanner was discharged yesterday after being treated for mild DKA and receiving her hemodialysis. She tells us that when she got home about 8:00 she checked her blood sugar and elevated risen rapidly to over 500. She could not find her insulin so she returned to the emergency department where she appeared to be back in DKA again. She states that she feels well. She has had no fever, chills, sweats, nausea, vomiting or pain. Her sugars are coming down and her anion gap is closing again on an insulin drip. It is unclear why her diabetes has suddenly become poorly controlled and she has slipped back into DKA recurrently. She tells us that she last had DKA over 10 years ago. The only thing that she has changed recently is that she started hemodialysis in August. I do not know why that would cause her diabetes to go out of control in this way. I would recommend that we keep her on an insulin drip and then transitioned her to her home insulin dose with sliding scale coverage before discharging her to make sure she is under better control.         Cliffton AstersJohn Dagmar Adcox, MD [facility] [phone] pager   [phone] cell 01/04/2015, 1:32 PM

## 2015-10-14 NOTE — ED Notes (Signed)
Per DKA protocol, this RN attempted to start D5 1/2NS @ 3275ml/hr since CBG 171. Pt only had one IV, this RN told pt we would need second line and pt is refusing, says she doesn't want any more IV sticks period. Attending MD notified

## 2015-10-14 NOTE — H&P (Signed)
Date: 10/14/2015               Patient Name:  Crystal Tanner Mcevoy MRN: [id number]  DOB: 01-Nov-1970 Age / Sex: 45 y.o., female   PCP: Dessa PhiJosalyn Funches, MD         Medical Service: Internal Medicine Teaching Service         Attending Physician: Dr. Cliffton AstersJohn Campbell, MD    First Contact: Dr. Eulah PontNina Blum Pager: [phone]  Second Contact: Dr. Valentino NoseNathan Boswell Pager: [phone]       After Hours (After 5p/  First Contact Pager: [phone]  weekends / holidays): Second Contact Pager: [phone]   Chief Complaint: Elevated blood sugar  History of Present Illness: Crystal Tanner is a 45 year old female with a PMHx of Type II Diabetes on insulin, ESRD on HD, and hypertension that presents today for elevated blood glucose.  Patient [facility] in [facility] evening, went home and checked her blood sugars.  She reported to have a blood glucose in the 400s.  She states she could not find her insulin pens and called the nurse on the unit that instructed her to come into the Emergency Department.  She states that there is no change in her symptoms since discharge.  She denies Nausea or vomiting, dizziness, abdominal pain, or leg swelling.  Meds:  No outpatient prescriptions have been marked as taking for the 10/14/15 encounter [facility](Hospital Encounter).     Allergies: Allergies as of 10/13/2015 - Review Complete 10/13/2015  Allergen Reaction Noted  . No known allergies  08/15/2015   Past Medical History:  Diagnosis Date  . Acute kidney injury (HCC)   . Anemia   . Chronic kidney disease   . Constipation   . Depression   . Diabetes mellitus without complication (HCC) 1991   Type II  . Gastritis   . GERD (gastroesophageal reflux disease)   . Heart murmur    as a child- doesnt remember having echo.  . Hemorrhoid   . Hypertension   . Renal cyst, left   . Shortness of breath dyspnea    with exertion    Family History: Mother:  Diabetes and Hypertension  Social  History: Tobacco use: former smoker, less than a pack a day for 15 years.  Currently does not smoke Alcohol use: denies Illicit drug use: smokes marijuana occaionally  Review of Systems: A complete ROS was negative except as per HPI.   Physical Exam: Blood pressure (!) 150/50, pulse 64, temperature 98.8 F (37.1 C), temperature source Oral, resp. rate 11, last menstrual period 09/29/2015, SpO2 100 %. Vitals:   10/13/15 2258 10/14/15 0145 10/14/15 0235 10/14/15 0236  BP: 162/61 (!) 134/54 (!) 150/50   Pulse: 64 (!) 57 66 64  Resp: 18   11  Temp: 98.8 F (37.1 C)     TempSrc: Oral     SpO2: 100% 100% 100% 100%   General: Vital signs reviewed.  Patient is well-developed and well-nourished, in no acute distress and cooperative with exam.  Head: Normocephalic and atraumatic. Eyes: EOMI, conjunctivae normal, no scleral icterus.  Neck: Supple, trachea midline, normal ROM Cardiovascular: RRR, S1 normal, S2 normal, no murmurs, gallops, or rubs. Pulmonary/Chest: Clear to auscultation bilaterally, no wheezes, rales, or rhonchi.  Effort normal Abdominal: Soft, non-tender, non-distended, BS +, no masses, organomegaly, or guarding present.  Musculoskeletal: No joint deformities, erythema, or stiffness, ROM full and nontender. Extremities: No lower extremity edema bilaterally,  pulses symmetric and intact bilaterally. No cyanosis or clubbing. Neurological: A&O x3, Strength is normal and symmetric bilaterally, no focal motor deficit, sensory intact to light touch bilaterally.  Skin: Warm, dry and intact. No rashes or erythema. Psychiatric: Normal mood and affect. speech and behavior is normal. Cognition and memory are normal.   EKG: none  CXR: none  Assessment & Plan by Problem: Active Problems:  DKA (diabetic ketoacidoses) (HCC) Patient was discharged less than 24hrs ago for Diabetic Ketoacidosis. Patient could not find her insulin pens at home and presents to the ED with anion gap of 19  and glucose 584. She is on dialysis but states she still produces urine.  We will order a UA. She was seen on 10/6 for the same diagnosis and was found to have ketones in her urine then. She is not complaining of her usual symptoms and states nothing has changed since discharge. - IV NS - continue glucostablizer protocol, when AG closes x 2 will transition to 7 units of lantus    - UA when able to provide a sample  ESRD on dialysis MWF Patient usually has dialysis on fridays.  She received dialysis inpatient on 10/12 because she had missed her usual wednesday session due to being in the hospital.  Will need to consult nephrology in the morning.  No indications for emergent dialysis at this time.   - Inpatient dialysis  Hypertension Patient takes clonidine 0.3mg  TID, cardizem 360mg  daily, hydralazine 100mg .  -Continue clonidine and cardizem  Dispo: Admit patient to Inpatient with expected length of stay greater than 2 midnights.  Signed: Camelia Phenes, DO 10/14/2015, 3:29 AM  Pager: [phone]

## 2015-10-14 NOTE — Progress Notes (Signed)
Patient admitted from ED around 9 am with no apparent distress noted. AAO x4, ambulate independently. On insulin drip, titrated per protocol.

## 2015-10-14 NOTE — Progress Notes (Signed)
Transport arrived to take patient to dialysis.  Insulin gtt stopped and CBG checked.  Resulted at 43. Patient provided with of apple juice, a fruit cup, and graham crackers with peanut butter. HD RN called and notified.  She stated that she would check CBGs and that it was okay to bring patient to HD.

## 2015-10-14 NOTE — ED Notes (Signed)
Pt sts she went to use her insulin tonight and all of her pens at home were "missing".  Pt suspicious that EMS may have lost them.  Pt concerned about missing pens.  Pt sts "i'm feeling so much sicker than when I got here".

## 2015-10-14 NOTE — Progress Notes (Signed)
Nutrition Brief Note  Patient identified on the Malnutrition Screening Tool (MST) Report  Wt Readings from Last 15 Encounters:  10/14/15 174 lb (78.9 kg)  10/13/15 163 lb 5.8 oz (74.1 kg)  10/07/15 173 lb (78.5 kg)  08/16/15 174 lb 12.8 oz (79.3 kg)  08/15/15 174 lb 12.8 oz (79.3 kg)  07/26/15 165 lb 8 oz (75.1 kg)  07/13/15 182 lb (82.6 kg)  07/12/15 184 lb (83.5 kg)  06/28/15 167 lb 9.6 oz (76 kg)  06/27/15 176 lb (79.8 kg)  06/20/15 176 lb (79.8 kg)  04/28/15 179 lb (81.2 kg)  04/05/15 201 lb (91.2 kg)  02/19/15 180 lb (81.6 kg)  01/07/15 178 lb (80.7 kg)    Body mass index is 27.25 kg/m. Patient meets criteria for Overweight based on current BMI. Pt states that he usual dry weight is about 174 lbs. She denies any weight loss. She states that her appetite varies, but she eats snacks if she skips meals. She checks her blood sugar regularly and takes insulin if glucose is high. She reports making an effort to eat more protein-rich foods recently as was instructed by HD dietitian. She denies any questions or concerns at this time.   Current diet order is Renal 1.2 L, patient is consuming approximately 100% of meals at this time. Labs and medications reviewed.   No nutrition interventions warranted at this time. If nutrition issues arise, please consult RD.   Dorothea Ogleeanne Yulisa Chirico RD, CSP, LDN Inpatient Clinical Dietitian Pager: [phone] After Hours Pager: [phone]

## 2015-10-14 NOTE — Progress Notes (Signed)
Spoke with staff RN about patient's low blood sugars on insulin drip.   States that patient went low since patient did not have D51/2 NS running in the ED. Reminded staff RN to cover CHO while on the insulin drip. Does have D51/2 NS is running now. Will need transition orders for SQ insulin when transitioned off the insulin drip.  Home insulin dose: Lantus 12 units daily, and Novolog SENSITIVE correction scale every 4 hours while NPO and TID & HS when eating. Will need prescription for insulin at discharge since patient was without insulin when discharged yesterday. Will continue to monitor blood sugars while in the hospital. Smith MinceKendra Hava Massingale RN BSN CDE

## 2015-10-14 NOTE — ED Provider Notes (Signed)
By signing my name below, I, Higinio Plan, attest that this documentation has been prepared under the direction and in the presence of [place], DO . Electronically Signed: Higinio Plan, Scribe. 10/14/2015. 2:10 AM.  TIME SEEN: 1:58 AM  CHIEF COMPLAINT: Hyperglycemia  HPI:   Crystal Tanner is a 45 y.o. female with PMHx of Type II DM and end-stage renal disease on hemodialysis who was last dialyzed yesterday, brought in by EMS to the Emergency Department for an evaluation of hyperglycemia. Pt reports she was recently admitted to [facility] on 10/11 for the same and was discharged this morning at 8:30 AM. She notes she had full dialysis this morning and tried to take her insulin upon returning home but could not find it anywhere. States she did not receive insulin prior to dialysis are afterwards and was discharged home. Per EMS, pt's CBG was 414 upon arrival. She denies nausea, vomiting and missing any dialysis appointments recently. Denies any fevers or cough.  ROS: See HPI Constitutional: no fever  Eyes: no drainage  ENT: no runny nose   Cardiovascular:  no chest pain Resp: no SOB  GI: no vomiting GU: no dysuria Integumentary: no rash  Allergy: no hives  Musculoskeletal: no leg swelling  Neurological: no slurred speech ROS otherwise negative  PAST MEDICAL HISTORY/PAST SURGICAL HISTORY:  Past Medical History:  Diagnosis Date  . Acute kidney injury ([place])   . Anemia   . Chronic kidney disease   . Constipation   . Depression   . Diabetes mellitus without complication ([place]) 0086   Type II  . Gastritis   . GERD (gastroesophageal reflux disease)   . Heart murmur    as a child- doesnt remember having echo.  . Hemorrhoid   . Hypertension   . Renal cyst, left   . Shortness of breath dyspnea    with exertion    MEDICATIONS:  Prior to Admission medications   Medication Sig Start Date End Date Taking? Authorizing Provider  Blood Glucose Monitoring Suppl (TRUE METRIX METER) w/Device KIT  1 each by Does not apply route 3 (three) times daily. 01/07/15   Boykin Nearing, MD  calcitRIOL (ROCALTROL) 0.25 MCG capsule Take 0.25 mcg by mouth every Monday, Wednesday, and Friday. 07/25/15   Historical Provider, MD  cloNIDine (CATAPRES) 0.3 MG tablet Take 1 tablet (0.3 mg total) by mouth 3 (three) times daily. 08/15/15   Josalyn Funches, MD  diltiazem (CARDIZEM CD) 180 MG 24 hr capsule TAKE 2 CAPSULES BY MOUTH DAILY. 09/27/15   Josalyn Funches, MD  docusate sodium (COLACE) 100 MG capsule Take 4 capsules (400 mg total) by mouth daily. 10/14/15   Ledell Noss, MD  furosemide (LASIX) 40 MG tablet Take 1 tablet (40 mg total) by mouth 2 (two) times daily. Patient not taking: Reported on 10/12/2015 06/24/15   Bonnell Public, MD  glucose blood (FREESTYLE TEST STRIPS) test strip Use as instructed 01/07/15   Boykin Nearing, MD  glucose blood (TRUE METRIX BLOOD GLUCOSE TEST) test strip Use as instructed 01/07/15   Boykin Nearing, MD  hydrALAZINE (APRESOLINE) 100 MG tablet TAKE 1 TABLET BY MOUTH 3 TIMES DAILY. 09/27/15   Josalyn Funches, MD  insulin aspart (NOVOLOG) 100 UNIT/ML injection As per sliding scale 10/11/15   Josalyn Funches, MD  insulin glargine (LANTUS) 100 UNIT/ML injection Inject 0.12 mLs (12 Units total) into the skin at bedtime. 08/29/15   Josalyn Funches, MD  metoCLOPramide (REGLAN) 10 MG tablet Take 1 tablet (10 mg total) by mouth  every 8 (eight) hours as needed for nausea or vomiting. 10/02/15   Jola Schmidt, MD  multivitamin (RENA-VIT) TABS tablet Take 1 tablet by mouth at bedtime. 06/24/15   Bonnell Public, MD  ondansetron (ZOFRAN ODT) 8 MG disintegrating tablet Take 1 tablet (8 mg total) by mouth every 8 (eight) hours as needed for nausea or vomiting. 10/02/15   Jola Schmidt, MD  oxyCODONE-acetaminophen (PERCOCET/ROXICET) 5-325 MG tablet Take 1-2 tablets by mouth every 6 (six) hours as needed for severe pain. Patient not taking: Reported on 10/12/2015 08/16/15   Elam Dutch, MD   promethazine (PHENERGAN) 25 MG suppository Place 1 suppository (25 mg total) rectally every 6 (six) hours as needed for nausea or vomiting. Patient not taking: Reported on 10/12/2015 10/07/15   Shary Decamp, PA-C  sodium bicarbonate 650 MG tablet Take 1 tablet (650 mg total) by mouth 3 (three) times daily. Patient not taking: Reported on 10/12/2015 08/15/15   Boykin Nearing, MD  TRUEPLUS LANCETS 28G MISC 1 each by Does not apply route 3 (three) times daily. 01/07/15   Boykin Nearing, MD    ALLERGIES:  Allergies  Allergen Reactions  . No Known Allergies     SOCIAL HISTORY:  Social History  Substance Use Topics  . Smoking status: Former Smoker    Packs/day: 0.25    Years: 15.00    Quit date: 08/14/2015  . Smokeless tobacco: Never Used  . Alcohol use No     Comment: less than a ppd, "not much"    FAMILY HISTORY: Family History  Problem Relation Age of Onset  . Hypertension Mother   . Diabetes Mother     EXAM: BP 162/61   Pulse 64   Temp 98.8 F (37.1 C) (Oral)   Resp 18   LMP 09/29/2015   SpO2 100%  CONSTITUTIONAL: Alert and oriented and responds appropriately to questions. Chronically ill-appearing, afebrile and in no distress HEAD: Normocephalic EYES: Conjunctivae clear, PERRL ENT: normal nose; no rhinorrhea; moist mucous membranes NECK: Supple, no meningismus, no LAD  CARD: RRR; S1 and S2 appreciated; no murmurs, no clicks, no rubs, no gallops CHEST:  Dialysis catheter in the right chest wall with no surrounding erythema, warmth or drainage  RESP: Normal chest excursion without splinting or tachypnea; breath sounds clear and equal bilaterally; no wheezes, no rhonchi, no rales, no hypoxia or respiratory distress, speaking full sentences ABD/GI: Normal bowel sounds; non-distended; soft, non-tender, no rebound, no guarding, no peritoneal signs BACK:  The back appears normal and is non-tender to palpation, there is no CVA tenderness EXT: Normal ROM in all joints; non-tender  to palpation; no edema; normal capillary refill; no cyanosis, no calf tenderness or swelling    SKIN: Normal color for age and race; warm; no rash NEURO: Moves all extremities equally, sensation to light touch intact diffusely, cranial nerves II through XII intact PSYCH: The patient's mood and manner are appropriate. Grooming and personal hygiene are appropriate.  MEDICAL DECISION MAKING: Patient here with DKA. She is hyperglycemic with a bicarbonate of 17, anion gap of 19. Given she is on dialysis, will give gentle IV hydration and start an insulin drip. We'll discuss with internal medicine for admission.  ED PROGRESS: Discussed with internal medicine resident for admission. Will admit to step down, observation. I will place holding orders per their request. Attending physician is Dr. Megan Salon. Patient updated with plan.   I reviewed all nursing notes, vitals, pertinent old records, EKGs, labs, imaging (as available).   I personally  performed the services described in this documentation, which was scribed in my presence. The recorded information has been reviewed and is accurate.     [place], DO 10/14/15 [phone]

## 2015-10-14 NOTE — ED Notes (Signed)
POCT CBG resulted 171; Tobi BastosAnna, RN notified

## 2015-10-14 NOTE — ED Notes (Signed)
Tray ordered for pt.

## 2015-10-14 NOTE — Care Management Note (Addendum)
Case Management Note  Patient Details  Name: Genelle BalCrystal Claxton MRN: [id number] Date of Birth: Jan 21, 1970  Subjective/Objective:  10/13 -( This NCM saw patient on 10/13, not 10/12 as system shows) Presents with DKA, patient is indep, she has  [facility] at 10 am , she states she was discharged on Wed and when ambulance transported her to the hospital she could not locate her insulin pens,  And when she went home she could not find the pens, this is why her bs went up and she had to come back to ED.  She states she need lantus and novalog,  NCM paged MD , has not received a call back yet.  Patient states her father can go pick it up for her at the [facility] clinic.  NCM is trying to contact MD so he can call it in to clinic for her father to pick up.  Still awaiting call back from MD. Received call from MD and she will send script electronically to [facility] clinic for lantus and novalog today.  Patient's father will go over at 3:30 to pick up.                Action/Plan:   Expected Discharge Date:                  Expected Discharge Plan:  Home/Self Care  In-House Referral:     Discharge planning Services  CM Consult, Medication Assistance, Indigent Health Clinic  Post Acute Care Choice:    Choice offered to:     DME Arranged:    DME Agency:     HH Arranged:    HH Agency:     Status of Service:  In process, will continue to follow  If discussed at Long Length of Stay Meetings, dates discussed:    Additional Comments:  Leone Havenaylor, Bralin Garry Clinton, RN 10/14/2015, 2:44 PM

## 2015-10-14 NOTE — ED Notes (Signed)
Patient asking if there is a lost and found.  Stated she is missing her insulin since her visit 10/11.  Pharmacy called and no insulin there with her name.

## 2015-10-15 LAB — GLUCOSE, CAPILLARY
Glucose-Capillary: 43 mg/dL — CL (ref 65–99)
Glucose-Capillary: 94 mg/dL (ref 65–99)
Glucose-Capillary: 129 mg/dL — ABNORMAL HIGH (ref 65–99)

## 2015-10-15 MED ORDER — ACETAMINOPHEN 500 MG PO TABS
1000.0000 mg | ORAL_TABLET | Freq: Four times a day (QID) | ORAL | Status: DC | PRN
  Administered 2015-10-15: 1000 mg via ORAL
  Filled 2015-10-15 (×2): qty 2

## 2015-10-15 NOTE — Progress Notes (Signed)
  Date: 10/15/2015  Patient name: [name]  Medical record number: [id number]  Date of birth: May 13, 1970   This patient's plan of care was discussed with the house staff. Please see their note for complete details. I concur with their findings.   Inez CatalinaEmily B Kyian Obst, MD 10/15/2015, 7:56 PM

## 2015-10-15 NOTE — Consult Note (Signed)
[facility] Renal Consultation Note    Indication for Consultation:  Management of ESRD/hemodialysis, anemia, hypertension/volume, and secondary hyperparathyroidism. PCP:  HPI: Crystal Tanner is a 45 y.o. female with ESRD, Type 2 DM, GERD, chronic constipation, HTN who has been admitted to observation status with DKA twice this week, now converted to full admission status.  First admitted 10/11 with DKA, anion gap closed with insulin drip and was discharged on 10/12 after dialysis. Returned to ED 10/13 after home BS was nearly 500 and she could not locate her home insulin. She was asymptomatic at that time, but labs showed in ED showed glucose 571, AG 19. She was readmitted for insulin drip. She was dialyzed late evening on 10/13, per usual schedule.   Currently, glucose is improved and she feels better. Denies any fevers or infections. No CP or dyspnea currently. Has chronic constipation, but this has been controlled recently. She is frustrated that she no longer has regular symptoms with hypo or hyperglycemia. Tells me that she is being discharged, and that her d/c papers are being prepared.   Past Medical History:  Diagnosis Date  . Acute kidney injury ([place])   . Anemia   . Chronic kidney disease   . Constipation   . Depression   . Diabetes mellitus without complication ([place]) 4462   Type II  . Gastritis   . GERD (gastroesophageal reflux disease)   . Heart murmur    as a child- doesnt remember having echo.  . Hemorrhoid   . Hypertension   . Renal cyst, left   . Shortness of breath dyspnea    with exertion   Past Surgical History:  Procedure Laterality Date  . ABSCESS DRAINAGE     Laparoscopic, abdominal  . ABSCESS DRAINAGE Left    arm  . BASCILIC VEIN TRANSPOSITION Left 06/27/2015   Procedure: FIRST STAGE BASILIC VEIN TRANSPOSITION;  Surgeon: Rosetta Posner, MD;  Location: [facility];  Service: Vascular;  Laterality: Left;  . INSERTION OF DIALYSIS CATHETER Right  08/16/2015   Procedure: INSERTION OF DIALYSIS CATHETER - RIGHT INTERNAL JUGULAR PLACEMENT;  Surgeon: Elam Dutch, MD;  Location: [facility];  Service: Vascular;  Laterality: Right;  . MULTIPLE TOOTH EXTRACTIONS     Family History  Problem Relation Age of Onset  . Hypertension Mother   . Diabetes Mother    Social History:  reports that she quit smoking about 2 months ago. She has a 3.75 pack-year smoking history. She has never used smokeless tobacco. She reports that she uses drugs, including Marijuana. She reports that she does not drink alcohol.  ROS: As per HPI otherwise negative.  Physical Exam: Vitals:   10/15/15 0407 10/15/15 0541 10/15/15 0730 10/15/15 0757  BP: (!) 197/67 (!) 154/60  (!) 173/66  Pulse: 64 (!) 53 (!) 55 (!) 53  Resp: _0 Temp: 98.1 F (36.7 C) 98.1 F (36.7 C)  98.8 F (37.1 C)  TempSrc: Oral Oral  Oral  SpO2: 100% 100% 100% 100%  Weight:      Height:         General: Well developed, well nourished, in no acute distress. Head: Normocephalic, atraumatic, sclera non-icteric, mucus membranes are moist. Neck: Supple without lymphadenopathy/masses. JVD not elevated. Lungs: Clear bilaterally to auscultation without wheezes, rales, or rhonchi. Breathing is unlabored. Heart: RRR with normal S1, S2. No murmurs, rubs, or gallops appreciated. Abdomen: Soft, non-tender, non-distended with normoactive bowel sounds. No rebound/guarding. No obvious abdominal masses. Musculoskeletal:  Strength and tone appear normal for age. Lower extremities: No edema or ischemic changes, no open wounds. Neuro: Alert and oriented X 3. Moves all extremities spontaneously. Psych:  Responds to questions appropriately with a normal affect. Dialysis Access: PC in R chest, LUE AVF + thrill, but moderate ecchymosis present  Allergies  Allergen Reactions  . No Known Allergies    Prior to Admission medications   Medication Sig Start Date End Date Taking? Authorizing Provider   Blood Glucose Monitoring Suppl (TRUE METRIX METER) w/Device KIT 1 each by Does not apply route 3 (three) times daily. 01/07/15   Boykin Nearing, MD  calcitRIOL (ROCALTROL) 0.25 MCG capsule Take 0.25 mcg by mouth every Monday, Wednesday, and Friday. 07/25/15   Historical Provider, MD  cloNIDine (CATAPRES) 0.3 MG tablet Take 1 tablet (0.3 mg total) by mouth 3 (three) times daily. 08/15/15   Josalyn Funches, MD  diltiazem (CARDIZEM CD) 180 MG 24 hr capsule TAKE 2 CAPSULES BY MOUTH DAILY. 09/27/15   Josalyn Funches, MD  docusate sodium (COLACE) 100 MG capsule Take 4 capsules (400 mg total) by mouth daily. 10/14/15   Ledell Noss, MD  furosemide (LASIX) 40 MG tablet Take 1 tablet (40 mg total) by mouth 2 (two) times daily. Patient not taking: Reported on 10/12/2015 06/24/15   Bonnell Public, MD  glucose blood (FREESTYLE TEST STRIPS) test strip Use as instructed 01/07/15   Boykin Nearing, MD  glucose blood (TRUE METRIX BLOOD GLUCOSE TEST) test strip Use as instructed 01/07/15   Boykin Nearing, MD  hydrALAZINE (APRESOLINE) 100 MG tablet TAKE 1 TABLET BY MOUTH 3 TIMES DAILY. 09/27/15   Josalyn Funches, MD  insulin aspart (NOVOLOG) 100 UNIT/ML injection As per sliding scale 10/11/15   Josalyn Funches, MD  insulin glargine (LANTUS) 100 UNIT/ML injection Inject 0.12 mLs (12 Units total) into the skin at bedtime. 08/29/15   Boykin Nearing, MD  metoCLOPramide (REGLAN) 10 MG tablet Take 1 tablet (10 mg total) by mouth every 8 (eight) hours as needed for nausea or vomiting. 10/02/15   Jola Schmidt, MD  multivitamin (RENA-VIT) TABS tablet Take 1 tablet by mouth at bedtime. 06/24/15   Bonnell Public, MD  ondansetron (ZOFRAN ODT) 8 MG disintegrating tablet Take 1 tablet (8 mg total) by mouth every 8 (eight) hours as needed for nausea or vomiting. 10/02/15   Jola Schmidt, MD  oxyCODONE-acetaminophen (PERCOCET/ROXICET) 5-325 MG tablet Take 1-2 tablets by mouth every 6 (six) hours as needed for severe pain. Patient not  taking: Reported on 10/12/2015 08/16/15   Elam Dutch, MD  promethazine (PHENERGAN) 25 MG suppository Place 1 suppository (25 mg total) rectally every 6 (six) hours as needed for nausea or vomiting. Patient not taking: Reported on 10/12/2015 10/07/15   Shary Decamp, PA-C  sodium bicarbonate 650 MG tablet Take 1 tablet (650 mg total) by mouth 3 (three) times daily. Patient not taking: Reported on 10/12/2015 08/15/15   Boykin Nearing, MD  TRUEPLUS LANCETS 28G MISC 1 each by Does not apply route 3 (three) times daily. 01/07/15   Boykin Nearing, MD   Current Facility-Administered Medications  Medication Dose Route Frequency Provider Last Rate Last Dose  . acetaminophen (TYLENOL) tablet 1,000 mg  1,000 mg Oral Q6H PRN Lorella Nimrod, MD   1,000 mg at 10/15/15 0815  . calcitRIOL (ROCALTROL) capsule 0.5 mcg  0.5 mcg Oral Q M,W,F-HD Jamal Maes, MD   0.5 mcg at 10/14/15 1130  . cloNIDine (CATAPRES) tablet 0.3 mg  0.3 mg Oral TID Harrell Gave  Cassie Freer, MD   0.3 mg at 10/15/15 0805  . diltiazem (CARDIZEM CD) 24 hr capsule 360 mg  360 mg Oral Daily Collier Salina, MD   360 mg at 10/14/15 2106  . docusate sodium (COLACE) capsule 400 mg  400 mg Oral Daily Collier Salina, MD   400 mg at 10/15/15 0804  . ferric gluconate (NULECIT) 125 mg in sodium chloride 0.9 % 100 mL IVPB  125 mg Intravenous Q M,W,F-HD Jamal Maes, MD   125 mg at 10/14/15 2200  . heparin injection 5,000 Units  5,000 Units Subcutaneous Q8H Collier Salina, MD      . hydrALAZINE (APRESOLINE) tablet 100 mg  100 mg Oral TID Ledell Noss, MD   100 mg at 10/15/15 0805  . insulin aspart (novoLOG) injection 0-9 Units  0-9 Units Subcutaneous Q4H Valinda Party, DO   1 Units at 10/15/15 0805  . metoCLOPramide (REGLAN) tablet 10 mg  10 mg Oral Q8H PRN Collier Salina, MD      . multivitamin (RENA-VIT) tablet 1 tablet  1 tablet Oral QHS Valinda Party, DO   1 tablet at 10/14/15 2133  . ondansetron (ZOFRAN-ODT) disintegrating  tablet 8 mg  8 mg Oral Q8H PRN Collier Salina, MD         Recent Labs Lab 10/14/15 0755 10/14/15 1042 10/14/15 1505 10/14/15 1959  NA 138 139 136 136  K 6.2* 3.8 3.6 3.6  CL 103 101 100* 99*  CO2 20* _0 GLUCOSE 202* 62* 191* 161*  BUN 43* 34* 36* 39*  CREATININE 4.62* 4.71* 5.00* 5.26*  CALCIUM 9.6 9.3 8.8* 8.7*  PHOS 3.9  --   --   --      Recent Labs Lab 10/12/15 1040 10/13/15 2308  AST 20 34  ALT 16 26  ALKPHOS 121 103  BILITOT 0.8 1.3*  PROT 6.8 6.3*  ALBUMIN 3.7 3.1*    Recent Labs Lab 10/12/15 1040  LIPASE 22     Recent Labs Lab 10/12/15 1040 10/13/15 2308  WBC 9.6 7.8  NEUTROABS 7.5 5.5  HGB 12.3 11.2*  HCT 35.7* 35.2*  MCV 86.0 87.1  PLT 193 191     Recent Labs Lab 10/14/15 2115 10/14/15 2218 10/14/15 2323 10/15/15 0540 10/15/15 0755  GLUCAP 199* 71 43* 94 129*    Dialysis Orders: MWF [facility] 4.25 hours 2K 2.25 Ca EDW 74 TDC (AVF recent infilt resting) Heparin 7800 Venofer 100 Calcitriol 0.5 mcg  Assessment/Plan: 1.  DKA, uncontrolled DM: Per primary. Hyperglycemia resolving, AG closed. 2.  ESRD:  Continue HD per MWF schedule, next 10/16. 3.  Hypertension/volume: BP slightly high, but no dyspnea. Did not get down to outpatient EDW with HD 10/13, she is about 3kg over goal. Continue OP meds and will try to get her down to EDW with next HD. 4.  Anemia: Hgb 11.2; no ESA needed for now. Getting course of IV iron. 5.  Metabolic bone disease: Ca 8.7; ?not on binders currently. Last Phos ok. Follow labs and continue VDRA. 6.  Nutrition:  Albumin 3.1; continue high protein diet.  Veneta Penton, PA-C 10/15/2015, 9:40 AM  [facility] Pager: [phone]  I have seen and examined this patient and agree with plan and assessment in the above note with renal recommendations/intervention highlighted. Pt tells me her discharge papers are being prepared.  Her next HD therefore will be on Monday at her outpt center.  Her insulins have been  called in and her father has picked up.  Breshay Ilg B,MD 10/15/2015 10:16 AM

## 2015-10-15 NOTE — Discharge Instructions (Signed)
When you get home you can use this sliding scale for your insulin Novalog CBGs  <120 no insulin 121-150 1 unit 151-200 2 units 201-250 3 units 251-300 5 units 301-350 7 units 351-400 9 units Please contact your PCP's office on Monday and request further instructions and try to get your appointment moved up sooner than October 31st   Your health care provider has decided you need to take insulin regularly. You have been given a correction scale (also called a sliding scale) in case you need extra insulin when your blood sugar is too high (hyperglycemia). The following instructions will assist you in how to use that correction scale.  WHAT IS A CORRECTION SCALE?  When you check your blood sugar, sometimes it will be higher than your health care provider has told you it should be. You may need an extra dose of insulin to bring your blood sugar to the recommended level (also known as your goal, target, or normal level). The correction scale is prescribed by your health care provider based on your specific needs.  Your correction scale has two parts:   The first shows you a blood sugar range.   The second part tells you how much extra insulin to give yourself if your blood sugar falls within this range. You will not need an extra dose of insulin if your blood glucose is in the desired range. You should simply give yourself the normal amount of insulin that your health care provider has ordered for you.  WHY IS IT IMPORTANT TO KEEP YOUR BLOOD SUGAR LEVELS AT YOUR DESIRED LEVEL?  Keeping your blood sugar at the desired level helps to prevent long-term complications of diabetes, such as eye disease, kidney failure, nerve damage, and other serious complications. WHAT TYPE OF INSULIN WILL YOU USE?  To help bring down blood sugar levels that are too high, your health care provider will prescribe a short-acting or a rapid-acting insulin. An example of a short-acting insulin would be regular  insulin. Remember, you may also have a longer-acting insulin prescribed for you.  WHAT DO YOU NEED TO DO?   Check your blood sugar with your home blood glucose meter as recommended by your health care provider.   Using your correction scale, find the range that your blood sugar lies in.   Look for the units of insulin that match that blood sugar range. Give yourself the dose of correction insulin your health care provider has prescribed. Always make sure you are using the right type of insulin.   Prior to the injection, make sure you have food available that you can eat in the next 15-30 minutes.   If your correction insulin is rapid acting, start eating your meal within 15 minutes after you have given yourself the insulin injection. If you wait longer than 15 minutes to eat, your blood sugar might get too low.   If your correction insulin is short acting(regular), start eating your meal within 30 minutes after you have given yourself the insulin injection. If you wait longer than 30 minutes to eat, your blood sugar might get too low. Symptoms of low blood sugar (hypoglycemia) may include feeling shaky or weak, sweating, feeling confused, difficulty seeing, agitation, crankiness, or numbness of the lips or tongue. Check your blood sugar immediately and treat your results as directed by your health care provider.   Keep a log of your blood sugar results with the time you took the test and the amount of insulin that  you injected. This information will help your health care provider manage your medicines.   Note on your log anything that may affect your blood sugar level, such as:   Changes in normal exercise or activity.   Changes in your normal schedule, such as staying up late, going on vacation, changing your diet, or holidays.   New medicines. This includes prescription and over-the-counter medicines. Some medicines may cause high blood sugar.   Sickness, stress, or anxiety.    Changes in the time you took your medicine.   Changes in your meals, such as skipping a meal, having a late meal, or dining out.   Eating things that may affect blood glucose, such as snacks, meal portions that are larger than normal, drinks with sugar, or eating less than usual.   Ask your health care provider any questions you have.  Be aware of "stacking" your insulin doses. This happens when you correct a high blood sugar level by giving yourself extra insulin too soon after a previous correction dose or mealtime dose. You may then have too much insulin still active in your body and may be at risk for hypoglycemia. WHY DO YOU NEED A CORRECTION SCALE IF YOU HAVE NEVER BEEN DIAGNOSED WITH DIABETES?   Keeping your blood glucose in the target range is important for your overall health.   You may have been prescribed medicines that cause your blood glucose to be higher than normal. WHEN SHOULD YOU SEEK MEDICAL CARE? Contact your health care provider if:   You have experienced hypoglycemia that you are unable to treat with your usual routine.   You have a high blood sugar level that is not coming down with the correction dose.  Your blood sugar is often too low or does not come up even if you eat a fast-acting carbohydrate. Someone who lives with you should seek immediate medical care if you become unresponsive.   This information is not intended to replace advice given to you by your health care provider. Make sure you discuss any questions you have with your health care provider.   Document Released: 05/11/2010 Document Revised: 08/20/2012 Document Reviewed: 05/30/2012 Elsevier Interactive Patient Education [employer]2016 Elsevier Inc.

## 2015-10-15 NOTE — Progress Notes (Signed)
   Subjective:  No events overnight. No complaints this morning. Feels well and reports she would like to go home today. No nausea or vomiting.   Objective:  Vital signs in last 24 hours: Vitals:   10/15/15 0407 10/15/15 0541 10/15/15 0730 10/15/15 0757  BP: (!) 197/67 (!) 154/60  (!) 173/66  Pulse: 64 (!) 53 (!) 55 (!) 53  Resp: 18 14 15 14   Temp: 98.1 F (36.7 C) 98.1 F (36.7 C)  98.8 F (37.1 C)  TempSrc: Oral Oral  Oral  SpO2: 100% 100% 100% 100%  Weight:      Height:       Physical Exam  Constitutional: She appears well-developed and well-nourished. No distress.  Cardiovascular: Normal rate and regular rhythm.   No murmur heard. Pulmonary/Chest: She has no wheezes. She has no rales.  Abdominal: Soft. She exhibits no distension. There is no tenderness.  Extremities: no calf tenderness, no peripheral edema   Assessment/Plan:  DKA (diabetic ketoacidoses) (HCC) Gap closed x2 last night. Transitioned over off the insulin gtt to her home Lantus 12 units qhs as well as sliding scale insulin. She was never symptomatic and would like to go home today. Were able to get her insulin prescription refilled (her father picked it up yesterday) and she will have that available for her at discharge. At this time we do not know the cause of her sudden increase in insulin resistance and recurrent diabetic ketoacidosis. She does have a history of frequent diabetic ketoacidosis after she was diagnosed at age 45. Renal failure can lead to some degree of insulin resistance but also is related to decreased insulin clearance. She may require a higher basal insulin dose however CBGs dipped to 43 after getting her home insulin dose last night. [facility] Has appointment scheduled for October 31st. Will ask her to follow up more closely given her recent hospitalizations.     ESRD on dialysis Received HD overnight. Will resume her MWF dialysis after  discharge.   Essential hypertension We have continued her home medication hydralazine 100 mg 3 times daily.  Dispo: Discharge today  Crystal Tanner Kameryn Davern, MD 10/15/2015, 12:07 PM Pager: [phone]

## 2015-10-15 NOTE — Progress Notes (Signed)
Received order to d/c patient.  Provided patient with discharge instructions.  Discussed monitoring blood sugar regularly.  Removed PIV.  Answered all patients questions.

## 2015-10-16 NOTE — Discharge Summary (Signed)
Name: Crystal Tanner MRN: [id number] DOB: 17-Jan-1970 45 y.o. PCP: Boykin Nearing, MD  Date of Admission: 10/12/2015 10:19 AM Date of Discharge: 10/13/2015 Attending Physician: Dr. Michel Bickers   Discharge Diagnosis: Principal Problem:   DKA (diabetic ketoacidoses) [facility]) Active Problems:   CKD (chronic kidney disease), stage V (HCC)   Constipation   Nausea with vomiting   Type 2 diabetes mellitus ([place])   Discharge Medications:   Medication List    TAKE these medications   calcitRIOL 0.25 MCG capsule Commonly known as:  ROCALTROL Take 0.25 mcg by mouth every Monday, Wednesday, and Friday.   cloNIDine 0.3 MG tablet Commonly known as:  CATAPRES Take 1 tablet (0.3 mg total) by mouth 3 (three) times daily.   diltiazem 180 MG 24 hr capsule Commonly known as:  CARDIZEM CD TAKE 2 CAPSULES BY MOUTH DAILY.   docusate sodium 100 MG capsule Commonly known as:  COLACE Take 4 capsules (400 mg total) by mouth daily.   furosemide 40 MG tablet Commonly known as:  LASIX Take 1 tablet (40 mg total) by mouth 2 (two) times daily.   glucose blood test strip Commonly known as:  FREESTYLE TEST STRIPS Use as instructed   glucose blood test strip Commonly known as:  TRUE METRIX BLOOD GLUCOSE TEST Use as instructed   hydrALAZINE 100 MG tablet Commonly known as:  APRESOLINE TAKE 1 TABLET BY MOUTH 3 TIMES DAILY.   insulin aspart 100 UNIT/ML injection Commonly known as:  novoLOG As per sliding scale   insulin glargine 100 UNIT/ML injection Commonly known as:  LANTUS Inject 0.12 mLs (12 Units total) into the skin at bedtime.   metoCLOPramide 10 MG tablet Commonly known as:  REGLAN Take 1 tablet (10 mg total) by mouth every 8 (eight) hours as needed for nausea or vomiting.   multivitamin Tabs tablet Take 1 tablet by mouth at bedtime.   ondansetron 8 MG disintegrating tablet Commonly known as:  ZOFRAN ODT Take 1 tablet (8 mg total) by mouth every 8 (eight) hours as needed  for nausea or vomiting.   oxyCODONE-acetaminophen 5-325 MG tablet Commonly known as:  PERCOCET/ROXICET Take 1-2 tablets by mouth every 6 (six) hours as needed for severe pain.   promethazine 25 MG suppository Commonly known as:  PHENERGAN Place 1 suppository (25 mg total) rectally every 6 (six) hours as needed for nausea or vomiting.   sodium bicarbonate 650 MG tablet Take 1 tablet (650 mg total) by mouth 3 (three) times daily.   TRUE METRIX METER w/Device Kit 1 each by Does not apply route 3 (three) times daily.   TRUEPLUS LANCETS 28G Misc 1 each by Does not apply route 3 (three) times daily.      Disposition and follow-up:   Ms.Blannie Scavone was discharged from [facility] in Good condition.  At the hospital follow up visit please address:  1.  DKA- please address home glucose recordings, she may need adjustment in her sliding scale and basal boluses.   Constipation- any difficulty with access to her stool softeners?   2.  Labs / imaging needed at time of follow-up: bmet   3.  Pending labs/ test needing follow-up: none   Follow-up Appointments: Follow-up Information    Minerva Ends, MD. Go on 11/01/2015.   Specialty:  Family Medicine Contact information: [place] [address] [phone]        [facility] Follow up on 10/14/2015.   Why:  Please go  to your regularly scheduled dialysis tomorrow morning at 10 am          Hospital Course by problem list:    Type 2 diabetes mellitus ([place])   DKA (diabetic ketoacidoses) ([place]) On admission she had glucose 300-600 and an anion gap of 17. She was diagnosed with diabetes at age 12 and had multiple episodes of DKA early on in her diagnosis. She had noticed that since starting dialysis she was having a harder time controlling her blood glucose level and frequently had readings in the 2-300 range.  She had been having a week of nausea and vomiting as mentioned below  and as a result she had decreased oral intake but continued to take her sliding scale insulin and lantus 12 units at bedtime. She was started on IV fluids and insulin and her gap closed and glucose normalized quickly. This was thought to be related to early DKA with a possible component of starvation ketoacidosis. [facility]     Constipation   Nausea with vomiting Presented with a one week history of nausea, vomiting, and chronic constipation. Ativan, haldol, and zofran in the ED did not relieve these symptoms but she had a bowel movement later on the evening of admission which resolved these complaints completely. She has history of chronic constipation and gastroparesis and had stopped taking her stool softener for a week but restarted that the week prior to admission, she believes that is why she was constipated.     CKD (chronic kidney disease), stage V ([place])  On the day of admission she had missed one day of hemodialysis while she was in the ED and had a creatinine 6.07. After hemodialysis 10/12 her SCrt improved to 4.17. She follows with [facility] for Monday, Wednesday, friday hemodialysis and had plans to return to her normally scheduled Friday dialysis the day after discharge.   Discharge Vitals:   BP (!) 167/63 (BP Location: Right Arm)   Pulse 67   Temp 98.3 F (36.8 C) (Oral)   Resp 16   Ht 5' 7"  (1.702 m)   Wt 163 lb 5.8 oz (74.1 kg) Comment: Standing scale  LMP 09/29/2015   SpO2 100%   BMI 25.59 kg/m   Procedures Performed:  Dg Abd 2 Views  Result Date: 10/02/2015 CLINICAL DATA:  45 year old female with history of nausea and vomiting this morning. EXAM: ABDOMEN - 2 VIEW COMPARISON:  Abdominal radiograph 06/20/2015. FINDINGS: Gas and stool are seen scattered throughout the colon extending to the level of the distal rectum. No pathologic distension of small bowel is noted. No gross evidence of pneumoperitoneum.  Numerous vascular calcifications of the visualized vasculature, including the abdominal aorta. IMPRESSION: 1.  Nonobstructive bowel gas pattern. 2. No pneumoperitoneum. 3. Aortic atherosclerosis. Electronically Signed   By: Vinnie Langton M.D.   On: 10/02/2015 15:16   Discharge Instructions: Discharge Instructions    Call MD for:  persistant nausea and vomiting    Complete by:  As directed    Call MD for:  redness, tenderness, or signs of infection (pain, swelling, redness, odor or green/yellow discharge around incision site)    Complete by:  As directed    Call MD for:  severe uncontrolled pain    Complete by:  As directed    Diet - low sodium heart healthy    Complete by:  As directed    Increase activity slowly    Complete by:  As  directed       Signed: Ledell Noss, MD 10/16/2015, 12:54 PM   Pager: [phone]

## 2015-10-16 NOTE — Discharge Summary (Signed)
Name: Crystal Tanner MRN: [id number] DOB: Oct 27, 1970 45 y.o. PCP: Crystal Nearing, MD  Date of Admission: 10/14/2015 12:23 AM Date of Discharge: 10/15/2015 Attending Physician: Dr. Michel Tanner   Discharge Diagnosis: Active Problems:   DKA (diabetic ketoacidoses) ([place])   ESRD on dialysis [facility])   Essential hypertension  Discharge Medications:   Medication List    TAKE these medications   calcitRIOL 0.25 MCG capsule Commonly known as:  ROCALTROL Take 0.25 mcg by mouth every Monday, Wednesday, and Friday.   cloNIDine 0.3 MG tablet Commonly known as:  CATAPRES Take 1 tablet (0.3 mg total) by mouth 3 (three) times daily.   diltiazem 180 MG 24 hr capsule Commonly known as:  CARDIZEM CD TAKE 2 CAPSULES BY MOUTH DAILY.   docusate sodium 100 MG capsule Commonly known as:  COLACE Take 4 capsules (400 mg total) by mouth daily.   furosemide 40 MG tablet Commonly known as:  LASIX Take 1 tablet (40 mg total) by mouth 2 (two) times daily.   glucose blood test strip Commonly known as:  FREESTYLE TEST STRIPS Use as instructed   glucose blood test strip Commonly known as:  TRUE METRIX BLOOD GLUCOSE TEST Use as instructed   hydrALAZINE 100 MG tablet Commonly known as:  APRESOLINE TAKE 1 TABLET BY MOUTH 3 TIMES DAILY.   insulin aspart 100 UNIT/ML injection Commonly known as:  novoLOG As per sliding scale   insulin glargine 100 UNIT/ML injection Commonly known as:  LANTUS Inject 0.12 mLs (12 Units total) into the skin at bedtime.   metoCLOPramide 10 MG tablet Commonly known as:  REGLAN Take 1 tablet (10 mg total) by mouth every 8 (eight) hours as needed for nausea or vomiting.   multivitamin Tabs tablet Take 1 tablet by mouth at bedtime.   ondansetron 8 MG disintegrating tablet Commonly known as:  ZOFRAN ODT Take 1 tablet (8 mg total) by mouth every 8 (eight) hours as needed for nausea or vomiting.   oxyCODONE-acetaminophen 5-325 MG tablet Commonly known as:   PERCOCET/ROXICET Take 1-2 tablets by mouth every 6 (six) hours as needed for severe pain.   promethazine 25 MG suppository Commonly known as:  PHENERGAN Place 1 suppository (25 mg total) rectally every 6 (six) hours as needed for nausea or vomiting.   sodium bicarbonate 650 MG tablet Take 1 tablet (650 mg total) by mouth 3 (three) times daily.   TRUE METRIX METER w/Device Kit 1 each by Does not apply route 3 (three) times daily.   TRUEPLUS LANCETS 28G Misc 1 each by Does not apply route 3 (three) times daily.       Disposition and follow-up:   [name] was discharged from [facility] in Good condition.  At the hospital follow up visit please address:  1.  DKA- please check her home glucose recordings and adjust basal or sliding scale insulin as needed.   2.  Labs / imaging needed at time of follow-up: bmet  3.  Pending labs/ test needing follow-up: none   Follow-up Appointments: Follow-up Information    Crystal Ends, MD Follow up in 1 week(s).   Specialty:  Family Medicine Contact information: [address]  [place] [address] [phone]           Hospital Course by problem list: Active Problems:   DKA (diabetic ketoacidoses) ([place])   ESRD on dialysis [facility])   Essential hypertension   Diabetic Ketoacidosis  [name] presented in asymptomatic diabetic ketoacidosis. When she returned  from the day of discharge she wasn't able to find her insulin pen, she did a finger stick and had a glucose of 400. She was started on insulin drip and D5NS. The cause of her recurrent DKA was unclear, she denied symptoms of infection and was afebrile. She mentioned that her blood glucose checks had been higher since starting hemodialysis. The cause of this is unclear, hemodialysis and esrd is usually related to decreased insulin clearance but renal failure can be related with increased insulin resistance. After the anion gap closed we restarted her  home dose of lantus 12 units qhs and the next morning her CBG was 43. A prescription for new lantus and novolog pens [facility] and she was discharged with instructions to keep using her sliding scale as prescribed and plans for close follow up there.   ESRD  She had hemodyalisis the evening of 10/13  Essential hypertension  Her blood pressure was controlled with her home medication hydralazine 100 mg three times daily and clonidine .    Discharge Vitals:   BP (!) 173/66 (BP Location: Right Arm)   Pulse (!) 53   Temp 98.8 F (37.1 C) (Oral)   Resp 14   Ht 5' 7"  (1.702 m)   Wt 174 lb (78.9 kg)   LMP 09/29/2015   SpO2 100%   BMI 27.25 kg/m   Procedures Performed:  Dg Abd 2 Views  Result Date: 10/02/2015 CLINICAL DATA:  45 year old female with history of nausea and vomiting this morning. EXAM: ABDOMEN - 2 VIEW COMPARISON:  Abdominal radiograph 06/20/2015. FINDINGS: Gas and stool are seen scattered throughout the colon extending to the level of the distal rectum. No pathologic distension of small bowel is noted. No gross evidence of pneumoperitoneum. Numerous vascular calcifications of the visualized vasculature, including the abdominal aorta. IMPRESSION: 1.  Nonobstructive bowel gas pattern. 2. No pneumoperitoneum. 3. Aortic atherosclerosis. Electronically Signed   By: Crystal Tanner M.D.   On: 10/02/2015 15:16      Discharge Instructions: Discharge Instructions    Call MD for:  persistant dizziness or light-headedness    Complete by:  As directed    Call MD for:  persistant nausea and vomiting    Complete by:  As directed    Call MD for:  temperature >100.4    Complete by:  As directed    Diet - low sodium heart healthy    Complete by:  As directed    Increase activity slowly    Complete by:  As directed       Signed: Ledell Noss, MD 10/16/2015, 1:47 PM   Pager: [phone]

## 2015-10-17 LAB — GLUCOSE, CAPILLARY: Glucose-Capillary: 125 mg/dL — ABNORMAL HIGH (ref 65–99)

## 2015-10-18 ENCOUNTER — Telehealth: Payer: Self-pay

## 2015-10-18 NOTE — Telephone Encounter (Signed)
Patient hipaa verif per guidelines. RN advised patient per Dr. Funches:Please inform the patient that labs are normal; negative hepatitis B surface antigen.  Patient verbalized understanding

## 2015-10-18 NOTE — Telephone Encounter (Signed)
-----   Message from Jaclyn ShaggyEnobong Amao, MD sent at 10/14/2015  5:33 PM EDT ----- Please inform the patient that labs are normal; negative hepatitis B surface antigen. Thank you.

## 2015-10-21 ENCOUNTER — Encounter (HOSPITAL_COMMUNITY): Payer: Self-pay

## 2015-10-21 ENCOUNTER — Emergency Department (HOSPITAL_COMMUNITY): Payer: Medicaid Other

## 2015-10-21 ENCOUNTER — Observation Stay (HOSPITAL_COMMUNITY)
Admission: EM | Admit: 2015-10-21 | Discharge: 2015-10-23 | Disposition: A | Discharge status: Home / Self Care | Payer: Medicaid Other | Attending: Internal Medicine | Admitting: Internal Medicine

## 2015-10-21 DIAGNOSIS — Z794 Long term (current) use of insulin: Secondary | ICD-10-CM | POA: Insufficient documentation

## 2015-10-21 DIAGNOSIS — D649 Anemia, unspecified: Secondary | ICD-10-CM | POA: Insufficient documentation

## 2015-10-21 DIAGNOSIS — R112 Nausea with vomiting, unspecified: Secondary | ICD-10-CM | POA: Diagnosis present

## 2015-10-21 DIAGNOSIS — Z992 Dependence on renal dialysis: Secondary | ICD-10-CM | POA: Diagnosis not present

## 2015-10-21 DIAGNOSIS — R9431 Abnormal electrocardiogram [ECG] [EKG]: Secondary | ICD-10-CM | POA: Diagnosis present

## 2015-10-21 DIAGNOSIS — E119 Type 2 diabetes mellitus without complications: Secondary | ICD-10-CM

## 2015-10-21 DIAGNOSIS — Z87891 Personal history of nicotine dependence: Secondary | ICD-10-CM | POA: Diagnosis not present

## 2015-10-21 DIAGNOSIS — K219 Gastro-esophageal reflux disease without esophagitis: Secondary | ICD-10-CM | POA: Insufficient documentation

## 2015-10-21 DIAGNOSIS — E1122 Type 2 diabetes mellitus with diabetic chronic kidney disease: Secondary | ICD-10-CM | POA: Diagnosis not present

## 2015-10-21 DIAGNOSIS — Z833 Family history of diabetes mellitus: Secondary | ICD-10-CM | POA: Diagnosis not present

## 2015-10-21 DIAGNOSIS — Z8249 Family history of ischemic heart disease and other diseases of the circulatory system: Secondary | ICD-10-CM

## 2015-10-21 DIAGNOSIS — E1165 Type 2 diabetes mellitus with hyperglycemia: Secondary | ICD-10-CM | POA: Diagnosis not present

## 2015-10-21 DIAGNOSIS — N186 End stage renal disease: Secondary | ICD-10-CM

## 2015-10-21 DIAGNOSIS — R739 Hyperglycemia, unspecified: Secondary | ICD-10-CM | POA: Diagnosis present

## 2015-10-21 DIAGNOSIS — Z79899 Other long term (current) drug therapy: Secondary | ICD-10-CM

## 2015-10-21 DIAGNOSIS — K5909 Other constipation: Secondary | ICD-10-CM | POA: Diagnosis not present

## 2015-10-21 DIAGNOSIS — F329 Major depressive disorder, single episode, unspecified: Secondary | ICD-10-CM | POA: Insufficient documentation

## 2015-10-21 DIAGNOSIS — I10 Essential (primary) hypertension: Secondary | ICD-10-CM

## 2015-10-21 DIAGNOSIS — I120 Hypertensive chronic kidney disease with stage 5 chronic kidney disease or end stage renal disease: Secondary | ICD-10-CM | POA: Insufficient documentation

## 2015-10-21 LAB — COMPREHENSIVE METABOLIC PANEL
ALBUMIN: 3.5 g/dL (ref 3.5–5.0)
ALT: 22 U/L (ref 14–54)
ANION GAP: 12 (ref 5–15)
AST: 24 U/L (ref 15–41)
Alkaline Phosphatase: 116 U/L (ref 38–126)
BILIRUBIN TOTAL: 0.7 mg/dL (ref 0.3–1.2)
BUN: 48 mg/dL — AB (ref 6–20)
CHLORIDE: 105 mmol/L (ref 101–111)
CO2: 21 mmol/L — ABNORMAL LOW (ref 22–32)
Calcium: 9.4 mg/dL (ref 8.9–10.3)
Creatinine, Ser: 5.79 mg/dL — ABNORMAL HIGH (ref 0.44–1.00)
GFR calc Af Amer: 9 mL/min — ABNORMAL LOW (ref >60)
GFR calc non Af Amer: 8 mL/min — ABNORMAL LOW (ref >60)
GLUCOSE: 400 mg/dL — AB (ref 65–99)
POTASSIUM: 3.8 mmol/L (ref 3.5–5.1)
SODIUM: 138 mmol/L (ref 135–145)
TOTAL PROTEIN: 6.7 g/dL (ref 6.5–8.1)

## 2015-10-21 LAB — URINE MICROSCOPIC-ADD ON

## 2015-10-21 LAB — CBC WITH DIFFERENTIAL/PLATELET
BASOS ABS: 0 10*3/uL (ref 0.0–0.1)
BASOS PCT: 0 %
EOS ABS: 0.1 10*3/uL (ref 0.0–0.7)
EOS PCT: 2 %
HEMATOCRIT: 35.6 % — AB (ref 36.0–46.0)
Hemoglobin: 11.3 g/dL — ABNORMAL LOW (ref 12.0–15.0)
Lymphocytes Relative: 12 %
Lymphs Abs: 0.9 10*3/uL (ref 0.7–4.0)
MCH: 28 pg (ref 26.0–34.0)
MCHC: 31.7 g/dL (ref 30.0–36.0)
MCV: 88.3 fL (ref 78.0–100.0)
MONO ABS: 0.2 10*3/uL (ref 0.1–1.0)
MONOS PCT: 3 %
NEUTROS ABS: 6.3 10*3/uL (ref 1.7–7.7)
Neutrophils Relative %: 83 %
PLATELETS: 211 10*3/uL (ref 150–400)
RBC: 4.03 MIL/uL (ref 3.87–5.11)
RDW: 16.5 % — AB (ref 11.5–15.5)
WBC: 7.5 10*3/uL (ref 4.0–10.5)

## 2015-10-21 LAB — URINALYSIS, ROUTINE W REFLEX MICROSCOPIC
BILIRUBIN URINE: NEGATIVE
Glucose, UA: >1000 mg/dL — AB
HGB URINE DIPSTICK: NEGATIVE
KETONES UR: NEGATIVE mg/dL
Leukocytes, UA: NEGATIVE
NITRITE: NEGATIVE
Protein, ur: >300 mg/dL — AB
Specific Gravity, Urine: 1.013 (ref 1.005–1.030)
pH: 7.5 (ref 5.0–8.0)

## 2015-10-21 LAB — I-STAT CHEM 8, ED
BUN: 47 mg/dL — ABNORMAL HIGH (ref 6–20)
CALCIUM ION: 1.09 mmol/L — AB (ref 1.15–1.40)
Chloride: 105 mmol/L (ref 101–111)
Creatinine, Ser: 5.9 mg/dL — ABNORMAL HIGH (ref 0.44–1.00)
Glucose, Bld: 390 mg/dL — ABNORMAL HIGH (ref 65–99)
HCT: 37 % (ref 36.0–46.0)
Hemoglobin: 12.6 g/dL (ref 12.0–15.0)
Potassium: 3.8 mmol/L (ref 3.5–5.1)
SODIUM: 140 mmol/L (ref 135–145)
TCO2: 21 mmol/L (ref 0–100)

## 2015-10-21 LAB — GLUCOSE, CAPILLARY
Glucose-Capillary: 347 mg/dL — ABNORMAL HIGH (ref 65–99)
Glucose-Capillary: 331 mg/dL — ABNORMAL HIGH (ref 65–99)

## 2015-10-21 LAB — CBG MONITORING, ED
GLUCOSE-CAPILLARY: 416 mg/dL — AB (ref 65–99)
GLUCOSE-CAPILLARY: 266 mg/dL — AB (ref 65–99)

## 2015-10-21 LAB — MAGNESIUM: Magnesium: 2.4 mg/dL (ref 1.7–2.4)

## 2015-10-21 MED ORDER — INSULIN ASPART 100 UNIT/ML ~~LOC~~ SOLN
0.0000 [IU] | Freq: Every day | SUBCUTANEOUS | Status: DC
  Administered 2015-10-21: 4 [IU] via SUBCUTANEOUS

## 2015-10-21 MED ORDER — DOCUSATE SODIUM 100 MG PO CAPS
400.0000 mg | ORAL_CAPSULE | Freq: Every day | ORAL | Status: DC
  Filled 2015-10-21: qty 4

## 2015-10-21 MED ORDER — ONDANSETRON 4 MG PO TBDP
4.0000 mg | ORAL_TABLET | Freq: Three times a day (TID) | ORAL | Status: DC | PRN
  Filled 2015-10-21: qty 1

## 2015-10-21 MED ORDER — RENA-VITE PO TABS
1.0000 | ORAL_TABLET | Freq: Every day | ORAL | Status: DC
  Administered 2015-10-21 – 2015-10-22 (×2): 1 via ORAL
  Filled 2015-10-21 (×2): qty 1

## 2015-10-21 MED ORDER — INSULIN GLARGINE 100 UNIT/ML ~~LOC~~ SOLN
6.0000 [IU] | Freq: Every day | SUBCUTANEOUS | Status: DC
  Administered 2015-10-21: 6 [IU] via SUBCUTANEOUS
  Filled 2015-10-21 (×2): qty 0.06

## 2015-10-21 MED ORDER — HEPARIN SODIUM (PORCINE) 5000 UNIT/ML IJ SOLN
5000.0000 [IU] | Freq: Three times a day (TID) | INTRAMUSCULAR | Status: DC
  Filled 2015-10-21 (×3): qty 1

## 2015-10-21 MED ORDER — SODIUM CHLORIDE 0.9% FLUSH
3.0000 mL | Freq: Two times a day (BID) | INTRAVENOUS | Status: DC
  Administered 2015-10-21 – 2015-10-22 (×3): 3 mL via INTRAVENOUS

## 2015-10-21 MED ORDER — HYDRALAZINE HCL 50 MG PO TABS
100.0000 mg | ORAL_TABLET | Freq: Three times a day (TID) | ORAL | Status: DC
Start: 2015-10-21 — End: 2015-10-21

## 2015-10-21 MED ORDER — SODIUM CHLORIDE 0.9 % IV BOLUS (SEPSIS)
500.0000 mL | Freq: Once | INTRAVENOUS | Status: AC
  Administered 2015-10-21: 500 mL via INTRAVENOUS

## 2015-10-21 MED ORDER — METOCLOPRAMIDE HCL 5 MG/ML IJ SOLN
10.0000 mg | Freq: Once | INTRAMUSCULAR | Status: AC
  Administered 2015-10-21: 10 mg via INTRAVENOUS
  Filled 2015-10-21: qty 2

## 2015-10-21 MED ORDER — SODIUM CHLORIDE 0.9 % IV SOLN
10.0000 mg | INTRAVENOUS | Status: DC
  Administered 2015-10-21: 10 mg via INTRAVENOUS
  Filled 2015-10-21 (×2): qty 1

## 2015-10-21 MED ORDER — FAMOTIDINE IN NACL 20-0.9 MG/50ML-% IV SOLN
20.0000 mg | Freq: Two times a day (BID) | INTRAVENOUS | Status: DC
  Filled 2015-10-21: qty 50

## 2015-10-21 MED ORDER — CALCITRIOL 0.25 MCG PO CAPS: 0.2500 ug | ORAL_CAPSULE | ORAL | Status: DC

## 2015-10-21 MED ORDER — LABETALOL HCL 5 MG/ML IV SOLN
10.0000 mg | Freq: Once | INTRAVENOUS | Status: AC
  Administered 2015-10-21: 10 mg via INTRAVENOUS
  Filled 2015-10-21: qty 4

## 2015-10-21 MED ORDER — INSULIN ASPART 100 UNIT/ML ~~LOC~~ SOLN
5.0000 [IU] | Freq: Once | SUBCUTANEOUS | Status: DC
Start: 2015-10-21 — End: 2015-10-21
  Filled 2015-10-21: qty 1

## 2015-10-21 MED ORDER — INSULIN ASPART 100 UNIT/ML ~~LOC~~ SOLN
0.0000 [IU] | Freq: Three times a day (TID) | SUBCUTANEOUS | Status: DC
  Administered 2015-10-21: 7 [IU] via SUBCUTANEOUS
  Administered 2015-10-22: 15 [IU] via SUBCUTANEOUS

## 2015-10-21 MED ORDER — MAGNESIUM SULFATE IN D5W 1-5 GM/100ML-% IV SOLN
1.0000 g | INTRAVENOUS | Status: AC
  Administered 2015-10-21: 1 g via INTRAVENOUS
  Filled 2015-10-21: qty 100

## 2015-10-21 MED ORDER — HYDRALAZINE HCL 20 MG/ML IJ SOLN
10.0000 mg | Freq: Three times a day (TID) | INTRAMUSCULAR | Status: DC
  Administered 2015-10-21 – 2015-10-22 (×2): 10 mg via INTRAVENOUS
  Filled 2015-10-21 (×2): qty 1

## 2015-10-21 MED ORDER — SODIUM BICARBONATE 650 MG PO TABS
650.0000 mg | ORAL_TABLET | Freq: Three times a day (TID) | ORAL | Status: DC
  Administered 2015-10-21 – 2015-10-23 (×5): 650 mg via ORAL
  Filled 2015-10-21 (×5): qty 1

## 2015-10-21 MED ORDER — CLONIDINE HCL 0.3 MG PO TABS: 0.3000 mg | ORAL_TABLET | Freq: Three times a day (TID) | ORAL | Status: DC

## 2015-10-21 MED ORDER — INSULIN ASPART 100 UNIT/ML ~~LOC~~ SOLN: 0.0000 [IU] | Freq: Three times a day (TID) | SUBCUTANEOUS | Status: DC

## 2015-10-21 MED ORDER — METOCLOPRAMIDE HCL 5 MG/ML IJ SOLN
10.0000 mg | Freq: Three times a day (TID) | INTRAMUSCULAR | Status: DC
Start: 2015-10-21 — End: 2015-10-22
  Administered 2015-10-21 – 2015-10-22 (×3): 10 mg via INTRAVENOUS
  Filled 2015-10-21 (×3): qty 2

## 2015-10-21 MED ORDER — HALOPERIDOL LACTATE 5 MG/ML IJ SOLN
2.0000 mg | Freq: Once | INTRAMUSCULAR | Status: AC
  Administered 2015-10-21: 2 mg via INTRAVENOUS
  Filled 2015-10-21: qty 1

## 2015-10-21 MED ORDER — DILTIAZEM HCL ER COATED BEADS 180 MG PO CP24: 360.0000 mg | ORAL_CAPSULE | Freq: Every day | ORAL | Status: DC

## 2015-10-21 MED ORDER — SODIUM CHLORIDE 0.9 % IV SOLN
1.0000 g | Freq: Once | INTRAVENOUS | Status: AC
  Administered 2015-10-21: 1 g via INTRAVENOUS
  Filled 2015-10-21: qty 10

## 2015-10-21 NOTE — ED Notes (Addendum)
Minimal swelling noted at right forearm at iv site. PA AWare

## 2015-10-21 NOTE — ED Notes (Addendum)
Pt continues with frequent gaging . Moves constantly in rythmic movement. Pt diaphoretic.Dose and rate of mag confirmed with pharmiacist.

## 2015-10-21 NOTE — ED Provider Notes (Signed)
[facility] DEPT Provider Note   Tanner: [id number] Arrival date & time: 10/21/15  1101     History   Chief Complaint Chief Complaint  Patient presents with  . Emesis    HPI Crystal Tanner is a 45 y.o. female.  The history is provided by the patient and medical records. No language interpreter was used.  Emesis   Pertinent negatives include no abdominal pain, no cough, no diarrhea, no fever and no headaches.   [facility] Tanner is a 45 y.o. female  with a PMH of CKD on dialysis MWF, DM, HTN, gastritis who  presents to the Emergency Department complaining of acute onset of constant n/v x 3 hours. 4 zofran given by EMS prior to arrival with no improvement of symptoms. No other medications taken prior to arrival for symptoms. Patient denies chest pain, palpitations, abdominal pain, difficulty breathing, fevers or any other associated complaints. Patient recently discharged from ED for DKA, nausea, vomiting on 10/14 (6 days ago). Patient states she was eating as usual with no nausea or vomiting at home until today.  Past Medical History:  Diagnosis Date  . Acute kidney injury ([place])   . Anemia   . Chronic kidney disease   . Constipation   . Depression   . Diabetes mellitus without complication ([place]) 6789   Type II  . Gastritis   . GERD (gastroesophageal reflux disease)   . Heart murmur    as a child- doesnt remember having echo.  . Hemorrhoid   . Hypertension   . Renal cyst, left   . Shortness of breath dyspnea    with exertion    Patient Active Problem List   Diagnosis Date Noted  . Hyperglycemia 10/21/2015  . ESRD on dialysis ([place]) 10/14/2015  . Essential hypertension 10/14/2015  . DKA (diabetic ketoacidoses) ([place]) 10/12/2015  . Secondary hyperparathyroidism of renal origin ([place]) 06/27/2015  . Acute on chronic kidney failure ([place]) 06/21/2015  . Nausea with vomiting 06/21/2015  . Hypokalemia 06/21/2015  . Type 2 diabetes mellitus ([place]) 06/21/2015  . Constipation  04/28/2015  . Ileus ([place]) 04/28/2015  . Emesis   . Hypoglycemia 04/05/2015  . Nausea & vomiting 02/19/2015  . Dysmenorrhea 01/07/2015  . Metabolic acidosis 38/10/1749  . Nausea and vomiting   . Chronic constipation 10/01/2014  . Current smoker 10/01/2014  . CKD (chronic kidney disease), stage V ([place]) 09/08/2014  . AKI (acute kidney injury) ([place]) 09/08/2014  . 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 Anemia, chronic renal failure 09/08/2014    Past Surgical History:  Procedure Laterality Date  . ABSCESS DRAINAGE     Laparoscopic, abdominal  . ABSCESS DRAINAGE Left    arm  . BASCILIC VEIN TRANSPOSITION Left 06/27/2015   Procedure: FIRST STAGE BASILIC VEIN TRANSPOSITION;  Surgeon: Rosetta Posner, MD;  Location: [place];  Service: Vascular;  Laterality: Left;  . INSERTION OF DIALYSIS CATHETER Right 08/16/2015   Procedure: INSERTION OF DIALYSIS CATHETER - RIGHT INTERNAL JUGULAR PLACEMENT;  Surgeon: Elam Dutch, MD;  Location: [place];  Service: Vascular;  Laterality: Right;  . MULTIPLE TOOTH EXTRACTIONS      OB History    Gravida Para Term Preterm AB Living   0 0 0 0 0 0   SAB TAB Ectopic Multiple Live Births   0 0 0 0         Home Medications    Prior to Admission medications   Medication Sig Start Date End Date Taking? Authorizing Provider  Blood Glucose Monitoring Suppl (  TRUE METRIX METER) w/Device KIT 1 each by Does not apply route 3 (three) times daily. 01/07/15   Boykin Nearing, MD  calcitRIOL (ROCALTROL) 0.25 MCG capsule Take 0.25 mcg by mouth every Monday, Wednesday, and Friday. 07/25/15   Historical Provider, MD  cloNIDine (CATAPRES) 0.3 MG tablet Take 1 tablet (0.3 mg total) by mouth 3 (three) times daily. 08/15/15   Josalyn Funches, MD  diltiazem (CARDIZEM CD) 180 MG 24 hr capsule TAKE 2 CAPSULES BY MOUTH DAILY. 09/27/15   Josalyn Funches, MD  docusate sodium (COLACE) 100 MG capsule Take 4 capsules (400 mg total) by mouth daily. 10/14/15   Ledell Noss, MD  furosemide  (LASIX) 40 MG tablet Take 1 tablet (40 mg total) by mouth 2 (two) times daily. Patient not taking: Reported on 10/12/2015 06/24/15   Bonnell Public, MD  glucose blood (FREESTYLE TEST STRIPS) test strip Use as instructed 01/07/15   Boykin Nearing, MD  glucose blood (TRUE METRIX BLOOD GLUCOSE TEST) test strip Use as instructed 01/07/15   Boykin Nearing, MD  hydrALAZINE (APRESOLINE) 100 MG tablet TAKE 1 TABLET BY MOUTH 3 TIMES DAILY. 09/27/15   Josalyn Funches, MD  insulin aspart (NOVOLOG) 100 UNIT/ML injection As per sliding scale 10/11/15   Josalyn Funches, MD  insulin glargine (LANTUS) 100 UNIT/ML injection Inject 0.12 mLs (12 Units total) into the skin at bedtime. 08/29/15   Boykin Nearing, MD  metoCLOPramide (REGLAN) 10 MG tablet Take 1 tablet (10 mg total) by mouth every 8 (eight) hours as needed for nausea or vomiting. 10/02/15   Jola Schmidt, MD  multivitamin (RENA-VIT) TABS tablet Take 1 tablet by mouth at bedtime. 06/24/15   Bonnell Public, MD  ondansetron (ZOFRAN ODT) 8 MG disintegrating tablet Take 1 tablet (8 mg total) by mouth every 8 (eight) hours as needed for nausea or vomiting. 10/02/15   Jola Schmidt, MD  oxyCODONE-acetaminophen (PERCOCET/ROXICET) 5-325 MG tablet Take 1-2 tablets by mouth every 6 (six) hours as needed for severe pain. Patient not taking: Reported on 10/12/2015 08/16/15   Elam Dutch, MD  promethazine (PHENERGAN) 25 MG suppository Place 1 suppository (25 mg total) rectally every 6 (six) hours as needed for nausea or vomiting. Patient not taking: Reported on 10/12/2015 10/07/15   Shary Decamp, PA-C  sodium bicarbonate 650 MG tablet Take 1 tablet (650 mg total) by mouth 3 (three) times daily. Patient not taking: Reported on 10/12/2015 08/15/15   Boykin Nearing, MD  TRUEPLUS LANCETS 28G MISC 1 each by Does not apply route 3 (three) times daily. 01/07/15   Boykin Nearing, MD    Family History Family History  Problem Relation Age of Onset  . Hypertension Mother   .  Diabetes Mother     Social History Social History  Substance Use Topics  . Smoking status: Former Smoker    Packs/day: 0.25    Years: 15.00    Quit date: 08/14/2015  . Smokeless tobacco: Never Used  . Alcohol use No     Comment: less than a ppd, "not much"     Allergies   No known allergies   Review of Systems Review of Systems  Constitutional: Negative for fever.  HENT: Negative for congestion.   Eyes: Negative for visual disturbance.  Respiratory: Negative for cough and shortness of breath.   Cardiovascular: Negative.   Gastrointestinal: Positive for nausea and vomiting. Negative for abdominal distention, abdominal pain, blood in stool, constipation and diarrhea.  Genitourinary: Negative for dysuria.  Musculoskeletal: Negative for back pain and  neck pain.  Skin: Negative for color change.  Neurological: Negative for headaches.     Physical Exam Updated Vital Signs BP (!) 167/53   Pulse 85   Resp (!) 29   Ht 5' 7"  (1.702 m)   Wt 79.4 kg   LMP 09/29/2015   SpO2 99%   BMI 27.41 kg/m   Physical Exam  Constitutional: She is oriented to person, place, and time. She appears well-developed and well-nourished.  Appears uncomfortable, NAD.   HENT:  Head: Normocephalic and atraumatic.  Cardiovascular: Normal rate, regular rhythm, normal heart sounds and intact distal pulses.  Exam reveals no gallop and no friction rub.   No murmur heard. Pulmonary/Chest: Effort normal and breath sounds normal. No respiratory distress. She has no wheezes. She has no rales. She exhibits no tenderness.  Abdominal: Soft. Bowel sounds are normal. She exhibits no distension. There is no tenderness.  Musculoskeletal: She exhibits no edema.  Neurological: She is alert and oriented to person, place, and time.  Skin: Skin is warm and dry.  Nursing note and vitals reviewed.    ED Treatments / Results  Labs (all labs ordered are listed, but only abnormal results are displayed) Labs  Reviewed  CBC WITH DIFFERENTIAL/PLATELET - Abnormal; Notable for the following:       Result Value   Hemoglobin 11.3 (*)    HCT 35.6 (*)    RDW 16.5 (*)    All other components within normal limits  COMPREHENSIVE METABOLIC PANEL - Abnormal; Notable for the following:    CO2 21 (*)    Glucose, Bld 400 (*)    BUN 48 (*)    Creatinine, Ser 5.79 (*)    GFR calc non Af Amer 8 (*)    GFR calc Af Amer 9 (*)    All other components within normal limits  URINALYSIS, ROUTINE W REFLEX MICROSCOPIC (NOT AT [facility]) - Abnormal; Notable for the following:    Color, Urine AMBER (*)    Glucose, UA >1000 (*)    Protein, ur >300 (*)    All other components within normal limits  URINE MICROSCOPIC-ADD ON - Abnormal; Notable for the following:    Squamous Epithelial / LPF 0-5 (*)    Bacteria, UA RARE (*)    All other components within normal limits  CBG MONITORING, ED - Abnormal; Notable for the following:    Glucose-Capillary 416 (*)    All other components within normal limits  I-STAT CHEM 8, ED - Abnormal; Notable for the following:    BUN 47 (*)    Creatinine, Ser 5.90 (*)    Glucose, Bld 390 (*)    Calcium, Ion 1.09 (*)    All other components within normal limits  CBG MONITORING, ED - Abnormal; Notable for the following:    Glucose-Capillary 266 (*)    All other components within normal limits    EKG  EKG Interpretation  Date/Time:  Friday October 21 2015 11:06:32 EDT Ventricular Rate:  72 PR Interval:    QRS Duration: 79 QT Interval:  419 QTC Calculation: 459 R Axis:   95 Text Interpretation:  Sinus rhythm Probable anterior infarct, old Baseline wander in lead(s) III V1 V2 V3 V4 Confirmed by Zenia Resides  MD, ANTHONY ([id number]) on 10/21/2015 3:47:12 PM       Radiology Dg Chest Portable 1 View  Result Date: 10/21/2015 CLINICAL DATA:  Cough.  Nausea. EXAM: PORTABLE CHEST 1 VIEW COMPARISON:  08/16/2015. FINDINGS: Right IJ catheter noted. Cardiomegaly with normal  pulmonary vascularity. No  focal infiltrate. No pleural effusion or pneumothorax. Surgical clips left axilla. IMPRESSION: 1. Right IJ sheath noted in stable position. 2. Cardiomegaly with normal pulmonary vascularity. Interval resolution of pulmonary venous congestion. No acute pulmonary disease. Electronically Signed   By: Marcello Moores  Register   On: 10/21/2015 13:59    Procedures Procedures (including critical care time)  Medications Ordered in ED Medications  haloperidol lactate (HALDOL) injection 2 mg (not administered)  insulin aspart (novoLOG) injection 5 Units (5 Units Intravenous Not Given 10/21/15 1438)  calcium gluconate 1 g in sodium chloride 0.9 % 100 mL IVPB (0 g Intravenous Stopped 10/21/15 1203)  sodium chloride 0.9 % bolus 500 mL (0 mLs Intravenous Stopped 10/21/15 1446)  magnesium sulfate IVPB 1 g 100 mL (0 g Intravenous Stopped 10/21/15 1234)  metoCLOPramide (REGLAN) injection 10 mg (10 mg Intravenous Given 10/21/15 1242)     Initial Impression / Assessment and Plan / ED Course  I have reviewed the triage vital signs and the nursing notes.  Pertinent labs & imaging results that were available during my care of the patient were reviewed by me and considered in my medical decision making (see chart for details).  Clinical Course   Marquisa Borden is a 45 y.o. female who presents to ED for hyperglycemia and nausea/vomiting since this morning. History of the same and recently admitted x 2 this month. Upon arrival by EMS, ekg en route concerning for run of torsades. Calcium and mag given. EKG in ED reassuring. Labs obtained: normal white count, stable anemia. Glucose 400 with normal anion gap. CO2 21. UA with no ketones but does have >1000 glucose.  Doubt DKA given no ketones in urine and normal anion gap. Reglan and fluids given with no relief of symptoms. Patient feels as if she needs to stay in hospital for better control of symptoms.   Spoke with IM teaching service who will admit.   Patient discussed  with Dr. Zenia Resides who agrees with treatment plan.    Final Clinical Impressions(s) / ED Diagnoses   Final diagnoses:  Abnormal EKG  Hyperglycemia  Nausea and vomiting, intractability of vomiting not specified, unspecified vomiting type    New Prescriptions New Prescriptions   No medications on file     [facility] Ronzell Laban, PA-C 10/21/15 1551    Lacretia Leigh, MD 10/22/15 [phone]

## 2015-10-21 NOTE — ED Notes (Signed)
Pt voidfs 100 tro bedpan.

## 2015-10-21 NOTE — ED Notes (Signed)
CBG 416; RN notified

## 2015-10-21 NOTE — ED Triage Notes (Signed)
Pt arrives EMS GC with c/o n/v for last 3 hours. Pt is to go to dialysis today. Tremors evident on arrival. Given 4 mg zofran by EMS iv but continued nausea.EMS reports runof ectopy but questions tremors.

## 2015-10-21 NOTE — H&P (Signed)
Date: 10/21/2015               Patient Name:  [facility] Tanner MRN: [id number]  DOB: 1970-04-19 Age / Sex: 45 y.o., female   PCP: Boykin Nearing, MD         Medical Service: Internal Medicine Teaching Service         Attending Physician: Dr. Aldine Contes, MD    First Contact: Dr. Velna Ochs Pager: [phone]  Second Contact: Dr. Jule Ser Pager: [phone]       After Hours (After 5p/  First Contact Pager: [phone]  weekends / holidays): Second Contact Pager: [phone]   Chief Complaint: Nausea / Vomiting   History of Present Illness: Patient is a 45 yo F with pmhx significant for HTN, DM II, and ESRD on HD (M/W/F) who presents with nausea and vomiting for one day. Patient says she woke up this morning with severe nausea and has thrown up multiple times throughout the day. She denies abdominal pain and denies blood in her vomit. She says she took all of her medicine this morning but threw the pills back up. She experiences these episodes of N/V often, sometimes multiple times a month. When asked what normally helps her symptoms, she replied "nothing". Patient missed dialysis today due to not feeling well. Her last full session was Wednesday the 18th. She denies fevers at home and her last BM this morning was normal. She denies SOB but does endorse a cough. No chest pain. She denies dietary indiscretion.   In transit to the ED patient had an episode very concerning for torsades on EMS cardiac monitor (personally reviewed rhythm strip). EKG on arrival showed sinus rhythm and patient was vigorously shaking in bed. It is hard to say if rhythm episode was true torsades vs artifact, however patient received IV mag sulfate and IV calcium gluconate x 1. In the ED, patient was hypertensive BP 213/95, HR 86, RR 16, and O2 100% on RA. CXR was negative and labs were largely unremarkable. CBG was elevated to 416 however came down to 266 with 500 cc NS bolus and patient did not require insulin.  UA was negative for ketones, glucose > 1000. Creatinine was elevated to 5.9 (bl 5-6) and BUN 47. CBC without leukocytosis (7.5) and hemoglobin stable at baseline of 11. Magnesium level 2.4.   Meds:  Current Meds  Medication Sig  . Blood Glucose Monitoring Suppl (TRUE METRIX METER) w/Device KIT 1 each by Does not apply route 3 (three) times daily.  . calcitRIOL (ROCALTROL) 0.25 MCG capsule Take 0.25 mcg by mouth every Monday, Wednesday, and Friday.  . diltiazem (CARDIZEM CD) 180 MG 24 hr capsule TAKE 2 CAPSULES BY MOUTH DAILY.  Marland Kitchen docusate sodium (COLACE) 100 MG capsule Take 4 capsules (400 mg total) by mouth daily.  Marland Kitchen glucose blood (FREESTYLE TEST STRIPS) test strip Use as instructed  . glucose blood (TRUE METRIX BLOOD GLUCOSE TEST) test strip Use as instructed  . hydrALAZINE (APRESOLINE) 100 MG tablet TAKE 1 TABLET BY MOUTH 3 TIMES DAILY.  Marland Kitchen insulin aspart (NOVOLOG) 100 UNIT/ML injection As per sliding scale  . insulin glargine (LANTUS) 100 UNIT/ML injection Inject 0.12 mLs (12 Units total) into the skin at bedtime.  . multivitamin (RENA-VIT) TABS tablet Take 1 tablet by mouth at bedtime.  . ondansetron (ZOFRAN ODT) 8 MG disintegrating tablet Take 1 tablet (8 mg total) by mouth every 8 (eight) hours as needed for nausea or vomiting.     Allergies:  Allergies as of 10/21/2015 - Review Complete 10/21/2015  Allergen Reaction Noted  . No known allergies  08/15/2015   Past Medical History:  Diagnosis Date  . Acute kidney injury ([place])   . Anemia   . Chronic kidney disease   . Constipation   . Depression   . Diabetes mellitus without complication ([place]) 4081   Type II  . Gastritis   . GERD (gastroesophageal reflux disease)   . Heart murmur    as a child- doesnt remember having echo.  . Hemorrhoid   . Hypertension   . Renal cyst, left   . Shortness of breath dyspnea    with exertion    Family History:  Family History  Problem Relation Age of Onset  . Hypertension Mother   .  Diabetes Mother     Social History:  Social History   Social History  . Marital status: Single    Spouse name: N/A  . Number of children: N/A  . Years of education: N/A   Occupational History  . Not on file.   Social History Main Topics  . Smoking status: Former Smoker    Packs/day: 0.25    Years: 15.00    Quit date: 08/14/2015  . Smokeless tobacco: Never Used  . Alcohol use No     Comment: less than a ppd, "not much"  . Drug use:     Types: Marijuana     Comment: last time August 11  . Sexual activity: No   Other Topics Concern  . Not on file   Social History Narrative  . No narrative on file    Review of Systems: A complete ROS was negative except as per HPI.   Physical Exam: Blood pressure (!) 213/95, pulse 86, resp. rate 16, height 5' 7"  (1.702 m), weight 175 lb (79.4 kg), last menstrual period 09/29/2015, SpO2 100 %. Physical Exam Constitutional: Vigorously shaking in bed  Cardiovascular: RRR, no murmurs, rubs, or gallops.  Pulmonary/Chest: CTAB, no wheezes, rales, or rhonchi. Right chest wall TCD Abdominal: Soft, non tender, non distended. +BS.  Extremities: Warm and well perfused. Distal pulses intact. 1+ pitting edema above the ankles bilaterally  Neurological: A&Ox3, CN II - XII grossly intact.  Skin: No rashes or erythema  Psychiatric: Normal mood and affect  EKG: Personally reviewed. Sinus rhythm, no ischemic changes. No significant changes from prior tracing.   CXR: Personally reviewed. Cardiomegaly but not evidence of congestion or consolidation.   Assessment & Plan by Problem:  Nausea / vomiting: Chronic and recurrent. Likely undiagnosed gastroparesis in the setting of poorly controlled diabetes vs HTN urgency. Patient is  Last A1C 10/12/2015 was 7.0, however patient's glucose was elevated to >400 on presentation and she was recently discharged on 10/14 after an episode of DKA. Patient reports similar episodes in the past, sometimes multiple times  a month.  -- PO Zofran 4 mg q8hrs prn -- IV Reglan 10 mg q8 hours  -- IV Pepcid 20 mg q12 hours  -- Strict glycemic control   -- S/p 500 cc bolus in ED -- Will hold off on further fluid resuscitation for now given ESRD on HD  ESRD: On HD M/W/F. Missed today's session, last full session on 10/18. She is 5 kg over her dry weight of 75. Patient denies SOB and CXR showed no pulmonary edema. She is laying flat on exam and breathing comfortably. BP is elevated up to 448J systolic but electrolytes are all within normal range.  -- Nephrology  consult, appreciate recommendations  -- Fluid restrict, daily weights, I/Os -- Continue home calcitriol, sodium bicarb, and multivitamin   HTN: BP elevated > 953 systolic on admission. Patient is not tolerating PO and has been unable to take her meds today. Also missed HD this morning. -- Hold home PO meds -- IV hydralazine 10 mg q8hrs scheduled and IV labetalol 10 mg once -- Nephrology recommends amlodipine 10 mg and Lisinopril 20 mg once daily for BP once able to tolerate po in place of home regimen   DM II: CBG down to 266 from 416 in the ED after 500 cc bolus. UA without ketones and anion gap is 15 corrected for albumin. Unlikely to be in DKA at this time.  -- SSI with meals and at bedtime  -- Lantus 6 QHS (12 at home) -- Serial CBGs  Chronic Constipation: -- Continue home colace   FEN: No fluids, Hemodialysis, Diet as tolerated  VTE ppx: Heparin  Code Status: FULL  Dispo: Admit patient to Observation with expected length of stay less than 2 midnights.  Signed: Velna Ochs, MD 10/21/2015, 5:14 PM  Pager: [phone]

## 2015-10-21 NOTE — ED Notes (Signed)
Pt voids approx 100cc to bedpan.

## 2015-10-21 NOTE — ED Notes (Signed)
Pt vomits small amount to bag.

## 2015-10-21 NOTE — Progress Notes (Signed)
New Admission Note:   [facility] Mental Orientation: Alert and oriented x4 Telemetry: Box #12 Assessment: Completed Skin: Intact IV: Rt FA Pain: Denies Tubes: N/A Safety Measures: Safety Fall Prevention Plan has been given, discussed Admission: Completed [address] Orientation: Patient has been orientated to the room, unit and staff.  Family: none at bedside  Orders have been reviewed and implemented. Will continue to monitor the patient. Call light has been placed within reach and bed alarm has been activated.   Toll BrothersCharito Minor Iden BSN, RN-BC Phone number: [phone]

## 2015-10-21 NOTE — ED Notes (Signed)
Attempted to call report

## 2015-10-22 DIAGNOSIS — N186 End stage renal disease: Secondary | ICD-10-CM

## 2015-10-22 DIAGNOSIS — K5909 Other constipation: Secondary | ICD-10-CM

## 2015-10-22 DIAGNOSIS — Z992 Dependence on renal dialysis: Secondary | ICD-10-CM

## 2015-10-22 DIAGNOSIS — Z794 Long term (current) use of insulin: Secondary | ICD-10-CM

## 2015-10-22 DIAGNOSIS — I120 Hypertensive chronic kidney disease with stage 5 chronic kidney disease or end stage renal disease: Secondary | ICD-10-CM

## 2015-10-22 DIAGNOSIS — R112 Nausea with vomiting, unspecified: Secondary | ICD-10-CM

## 2015-10-22 DIAGNOSIS — E1165 Type 2 diabetes mellitus with hyperglycemia: Secondary | ICD-10-CM

## 2015-10-22 DIAGNOSIS — E1122 Type 2 diabetes mellitus with diabetic chronic kidney disease: Secondary | ICD-10-CM

## 2015-10-22 LAB — RENAL FUNCTION PANEL
ALBUMIN: 3.3 g/dL — AB (ref 3.5–5.0)
Anion gap: 15 (ref 5–15)
BUN: 55 mg/dL — AB (ref 6–20)
CALCIUM: 9.2 mg/dL (ref 8.9–10.3)
CHLORIDE: 102 mmol/L (ref 101–111)
CO2: 21 mmol/L — ABNORMAL LOW (ref 22–32)
CREATININE: 6.29 mg/dL — AB (ref 0.44–1.00)
GFR, EST AFRICAN AMERICAN: 8 mL/min — AB (ref >60)
GFR, EST NON AFRICAN AMERICAN: 7 mL/min — AB (ref >60)
Glucose, Bld: 379 mg/dL — ABNORMAL HIGH (ref 65–99)
PHOSPHORUS: 4.7 mg/dL — AB (ref 2.5–4.6)
Potassium: 4 mmol/L (ref 3.5–5.1)
SODIUM: 138 mmol/L (ref 135–145)

## 2015-10-22 LAB — GLUCOSE, CAPILLARY
GLUCOSE-CAPILLARY: 437 mg/dL — AB (ref 65–99)
GLUCOSE-CAPILLARY: 183 mg/dL — AB (ref 65–99)
Glucose-Capillary: 327 mg/dL — ABNORMAL HIGH (ref 65–99)
Glucose-Capillary: 339 mg/dL — ABNORMAL HIGH (ref 65–99)

## 2015-10-22 LAB — CBC
HCT: 37.1 % (ref 36.0–46.0)
Hemoglobin: 11.5 g/dL — ABNORMAL LOW (ref 12.0–15.0)
MCH: 27.5 pg (ref 26.0–34.0)
MCHC: 31 g/dL (ref 30.0–36.0)
MCV: 88.8 fL (ref 78.0–100.0)
PLATELETS: 231 10*3/uL (ref 150–400)
RBC: 4.18 MIL/uL (ref 3.87–5.11)
RDW: 16.8 % — AB (ref 11.5–15.5)
WBC: 9.6 10*3/uL (ref 4.0–10.5)

## 2015-10-22 MED ORDER — LABETALOL HCL 5 MG/ML IV SOLN
10.0000 mg | Freq: Once | INTRAVENOUS | Status: AC
  Administered 2015-10-22: 10 mg via INTRAVENOUS
  Filled 2015-10-22: qty 4

## 2015-10-22 MED ORDER — INSULIN ASPART 100 UNIT/ML ~~LOC~~ SOLN
0.0000 [IU] | Freq: Three times a day (TID) | SUBCUTANEOUS | Status: DC
  Administered 2015-10-22: 8 [IU] via SUBCUTANEOUS
  Administered 2015-10-22: 11 [IU] via SUBCUTANEOUS
  Administered 2015-10-23: 8 [IU] via SUBCUTANEOUS

## 2015-10-22 MED ORDER — INSULIN ASPART 100 UNIT/ML ~~LOC~~ SOLN: 0.0000 [IU] | Freq: Three times a day (TID) | SUBCUTANEOUS | Status: DC

## 2015-10-22 MED ORDER — HEPARIN SODIUM (PORCINE) 1000 UNIT/ML DIALYSIS: 20.0000 [IU]/kg | INTRAMUSCULAR | Status: DC | PRN

## 2015-10-22 MED ORDER — INSULIN ASPART 100 UNIT/ML ~~LOC~~ SOLN
4.0000 [IU] | Freq: Once | SUBCUTANEOUS | Status: AC
  Administered 2015-10-22: 4 [IU] via SUBCUTANEOUS

## 2015-10-22 MED ORDER — HYDRALAZINE HCL 20 MG/ML IJ SOLN
10.0000 mg | Freq: Four times a day (QID) | INTRAMUSCULAR | Status: DC | PRN
  Administered 2015-10-22: 10 mg via INTRAVENOUS

## 2015-10-22 MED ORDER — ONDANSETRON HCL 4 MG/2ML IJ SOLN
4.0000 mg | Freq: Once | INTRAMUSCULAR | Status: AC
  Administered 2015-10-22: 4 mg via INTRAVENOUS
  Filled 2015-10-22: qty 2

## 2015-10-22 MED ORDER — HYDRALAZINE HCL 20 MG/ML IJ SOLN
INTRAMUSCULAR | Status: AC
  Filled 2015-10-22: qty 1

## 2015-10-22 MED ORDER — INSULIN GLARGINE 100 UNIT/ML ~~LOC~~ SOLN: 12.0000 [IU] | Freq: Every day | SUBCUTANEOUS | Status: DC

## 2015-10-22 MED ORDER — CLONIDINE HCL 0.1 MG/24HR TD PTWK
0.1000 mg | MEDICATED_PATCH | TRANSDERMAL | Status: DC
  Administered 2015-10-22: 0.1 mg via TRANSDERMAL
  Filled 2015-10-22 (×2): qty 1

## 2015-10-22 MED ORDER — HYDRALAZINE HCL 50 MG PO TABS
50.0000 mg | ORAL_TABLET | Freq: Three times a day (TID) | ORAL | Status: DC
  Administered 2015-10-22 – 2015-10-23 (×3): 50 mg via ORAL
  Filled 2015-10-22 (×3): qty 1

## 2015-10-22 MED ORDER — INSULIN ASPART 100 UNIT/ML ~~LOC~~ SOLN
3.0000 [IU] | Freq: Three times a day (TID) | SUBCUTANEOUS | Status: DC
  Administered 2015-10-22 – 2015-10-23 (×2): 3 [IU] via SUBCUTANEOUS

## 2015-10-22 MED ORDER — CALCITRIOL 0.5 MCG PO CAPS: 0.5000 ug | ORAL_CAPSULE | ORAL | Status: DC

## 2015-10-22 MED ORDER — INSULIN GLARGINE 100 UNIT/ML ~~LOC~~ SOLN
12.0000 [IU] | Freq: Every day | SUBCUTANEOUS | Status: DC
  Administered 2015-10-22: 12 [IU] via SUBCUTANEOUS
  Filled 2015-10-22 (×3): qty 0.12

## 2015-10-22 MED ORDER — METOCLOPRAMIDE HCL 5 MG PO TABS: 5.0000 mg | ORAL_TABLET | Freq: Three times a day (TID) | ORAL | Status: DC

## 2015-10-22 MED ORDER — ONDANSETRON HCL 4 MG/2ML IJ SOLN
4.0000 mg | Freq: Four times a day (QID) | INTRAMUSCULAR | Status: DC | PRN
  Administered 2015-10-22 – 2015-10-23 (×2): 4 mg via INTRAVENOUS
  Filled 2015-10-22 (×2): qty 2

## 2015-10-22 MED ORDER — PANTOPRAZOLE SODIUM 40 MG PO TBEC: 40.0000 mg | DELAYED_RELEASE_TABLET | Freq: Every day | ORAL | Status: DC

## 2015-10-22 MED ORDER — SODIUM CHLORIDE 0.9 % IV SOLN: 62.5000 mg | INTRAVENOUS | Status: DC

## 2015-10-22 NOTE — Progress Notes (Signed)
Received call from MD about bp 226/72 earlier this AM. Pt had received hydralazine 10 mg IV. Recheck of bp 194/76 manually taken on R arm. CBG 437. Text message sent to intern. Will continue to monitor patient and follow up with MD orders. Bess KindsGWALTNEY, Taneil Lazarus B, RN

## 2015-10-22 NOTE — Consult Note (Signed)
[facility] Renal Consultation Note  Indication for Consultation:  Management of ESRD/hemodialysis; anemia, hypertension/volume and secondary hyperparathyroidism  HPI: Crystal Tanner is a 45 y.o. female with ESRD sec. DM/htn  ( started HD 08/2015  [facility]. Op HD MWF [facility])DM with gastroparesis Chronic constipation/obstipationTobacco use admitted  with Nausea / vomiting  And HTN sbp >200 as op(reported missing her Clonidine doses recently). Her last HD was Wed 18th  Missed yesterday sec N/V.  Last week [facility] Dr. Jimmy Footman was tapering bp meds back. Her bp was controlled pre hd 118/71  and  156/7past wed and Monday pre hd.  Have been resting her  AVF sp infiltration with hematoma (plans to use next Mon hd )Admit CXR shows no excess volume.  Currently tolerating clear diet in room no cos.       Past Medical History:  Diagnosis Date  . Acute kidney injury ([place])   . Anemia   . Chronic kidney disease   . Constipation   . Depression   . Diabetes mellitus without complication ([place]) 0175   Type II  . Gastritis   . GERD (gastroesophageal reflux disease)   . Heart murmur    as a child- doesnt remember having echo.  . Hemorrhoid   . Hypertension   . Renal cyst, left   . Shortness of breath dyspnea    with exertion    Past Surgical History:  Procedure Laterality Date  . ABSCESS DRAINAGE     Laparoscopic, abdominal  . ABSCESS DRAINAGE Left    arm  . BASCILIC VEIN TRANSPOSITION Left 06/27/2015   Procedure: FIRST STAGE BASILIC VEIN TRANSPOSITION;  Surgeon: Rosetta Posner, MD;  Location: [facility];  Service: Vascular;  Laterality: Left;  . INSERTION OF DIALYSIS CATHETER Right 08/16/2015   Procedure: INSERTION OF DIALYSIS CATHETER - RIGHT INTERNAL JUGULAR PLACEMENT;  Surgeon: Elam Dutch, MD;  Location: [facility];  Service: Vascular;  Laterality: Right;  . MULTIPLE TOOTH EXTRACTIONS        Family History  Problem Relation Age of Onset  .  Hypertension Mother   . Diabetes Mother       reports that she quit smoking about 2 months ago. She has a 3.75 pack-year smoking history. She has never used smokeless tobacco. She reports that she uses drugs, including Marijuana. She reports that she does not drink alcohol.   Allergies  Allergen Reactions  . No Known Allergies     Prior to Admission medications   Medication Sig Start Date End Date Taking? Authorizing Provider  Blood Glucose Monitoring Suppl (TRUE METRIX METER) w/Device KIT 1 each by Does not apply route 3 (three) times daily. 01/07/15  Yes Boykin Nearing, MD  calcitRIOL (ROCALTROL) 0.25 MCG capsule Take 0.25 mcg by mouth every Monday, Wednesday, and Friday. 07/25/15  Yes Historical Provider, MD  diltiazem (CARDIZEM CD) 180 MG 24 hr capsule TAKE 2 CAPSULES BY MOUTH DAILY. 09/27/15  Yes Josalyn Funches, MD  docusate sodium (COLACE) 100 MG capsule Take 4 capsules (400 mg total) by mouth daily. 10/14/15  Yes Ledell Noss, MD  glucose blood (FREESTYLE TEST STRIPS) test strip Use as instructed 01/07/15  Yes Josalyn Funches, MD  glucose blood (TRUE METRIX BLOOD GLUCOSE TEST) test strip Use as instructed 01/07/15  Yes Josalyn Funches, MD  hydrALAZINE (APRESOLINE) 100 MG tablet TAKE 1 TABLET BY MOUTH 3 TIMES DAILY. 09/27/15  Yes Josalyn Funches, MD  insulin aspart (NOVOLOG) 100 UNIT/ML  injection As per sliding scale 10/11/15  Yes Josalyn Funches, MD  insulin glargine (LANTUS) 100 UNIT/ML injection Inject 0.12 mLs (12 Units total) into the skin at bedtime. 08/29/15  Yes Josalyn Funches, MD  multivitamin (RENA-VIT) TABS tablet Take 1 tablet by mouth at bedtime. 06/24/15  Yes Bonnell Public, MD  ondansetron (ZOFRAN ODT) 8 MG disintegrating tablet Take 1 tablet (8 mg total) by mouth every 8 (eight) hours as needed for nausea or vomiting. 10/02/15  Yes Jola Schmidt, MD  cloNIDine (CATAPRES) 0.3 MG tablet Take 1 tablet (0.3 mg total) by mouth 3 (three) times daily. Patient not taking: Reported on  10/21/2015 08/15/15   Boykin Nearing, MD  furosemide (LASIX) 40 MG tablet Take 1 tablet (40 mg total) by mouth 2 (two) times daily. Patient not taking: Reported on 10/21/2015 06/24/15   Bonnell Public, MD  metoCLOPramide (REGLAN) 10 MG tablet Take 1 tablet (10 mg total) by mouth every 8 (eight) hours as needed for nausea or vomiting. Patient not taking: Reported on 10/21/2015 10/02/15   Jola Schmidt, MD  oxyCODONE-acetaminophen (PERCOCET/ROXICET) 5-325 MG tablet Take 1-2 tablets by mouth every 6 (six) hours as needed for severe pain. Patient not taking: Reported on 10/21/2015 08/16/15   Elam Dutch, MD  promethazine (PHENERGAN) 25 MG suppository Place 1 suppository (25 mg total) rectally every 6 (six) hours as needed for nausea or vomiting. Patient not taking: Reported on 10/21/2015 10/07/15   Shary Decamp, PA-C  sodium bicarbonate 650 MG tablet Take 1 tablet (650 mg total) by mouth 3 (three) times daily. Patient not taking: Reported on 10/21/2015 08/15/15   Boykin Nearing, MD  TRUEPLUS LANCETS 28G MISC 1 each by Does not apply route 3 (three) times daily. 01/07/15   Boykin Nearing, MD     Anti-infectives    None      Results for orders placed or performed during the hospital encounter of 10/21/15 (from the past 48 hour(s))  CBG monitoring, ED     Status: Abnormal   Collection Time: 10/21/15 11:11 AM  Result Value Ref Range   Glucose-Capillary 416 (H) 65 - 99 mg/dL  CBC with Differential     Status: Abnormal   Collection Time: 10/21/15 11:25 AM  Result Value Ref Range   WBC 7.5 4.0 - 10.5 K/uL   RBC 4.03 3.87 - 5.11 MIL/uL   Hemoglobin 11.3 (L) 12.0 - 15.0 g/dL   HCT 35.6 (L) 36.0 - 46.0 %   MCV 88.3 78.0 - 100.0 fL   MCH 28.0 26.0 - 34.0 pg   MCHC 31.7 30.0 - 36.0 g/dL   RDW 16.5 (H) 11.5 - 15.5 %   Platelets 211 150 - 400 K/uL   Neutrophils Relative % 83 %   Neutro Abs 6.3 1.7 - 7.7 K/uL   Lymphocytes Relative 12 %   Lymphs Abs 0.9 0.7 - 4.0 K/uL   Monocytes Relative 3 %    Monocytes Absolute 0.2 0.1 - 1.0 K/uL   Eosinophils Relative 2 %   Eosinophils Absolute 0.1 0.0 - 0.7 K/uL   Basophils Relative 0 %   Basophils Absolute 0.0 0.0 - 0.1 K/uL  Comprehensive metabolic panel     Status: Abnormal   Collection Time: 10/21/15 11:25 AM  Result Value Ref Range   Sodium 138 135 - 145 mmol/L   Potassium 3.8 3.5 - 5.1 mmol/L   Chloride 105 101 - 111 mmol/L   CO2 21 (L) 22 - 32 mmol/L   Glucose, Bld 400 (  H) 65 - 99 mg/dL   BUN 48 (H) 6 - 20 mg/dL   Creatinine, Ser 5.79 (H) 0.44 - 1.00 mg/dL   Calcium 9.4 8.9 - 10.3 mg/dL   Total Protein 6.7 6.5 - 8.1 g/dL   Albumin 3.5 3.5 - 5.0 g/dL   AST 24 15 - 41 U/L   ALT 22 14 - 54 U/L   Alkaline Phosphatase 116 38 - 126 U/L   Total Bilirubin 0.7 0.3 - 1.2 mg/dL   GFR calc non Af Amer 8 (L) >60 mL/min   GFR calc Af Amer 9 (L) >60 mL/min    Comment: (NOTE) The eGFR has been calculated using the CKD EPI equation. This calculation has not been validated in all clinical situations. eGFR's persistently <60 mL/min signify possible Chronic Kidney Disease.    Anion gap 12 5 - 15  I-stat Chem 8, ED     Status: Abnormal   Collection Time: 10/21/15 11:36 AM  Result Value Ref Range   Sodium 140 135 - 145 mmol/L   Potassium 3.8 3.5 - 5.1 mmol/L   Chloride 105 101 - 111 mmol/L   BUN 47 (H) 6 - 20 mg/dL   Creatinine, Ser 5.90 (H) 0.44 - 1.00 mg/dL   Glucose, Bld 390 (H) 65 - 99 mg/dL   Calcium, Ion 1.09 (L) 1.15 - 1.40 mmol/L   TCO2 21 0 - 100 mmol/L   Hemoglobin 12.6 12.0 - 15.0 g/dL   HCT 37.0 36.0 - 46.0 %  Urinalysis, Routine w reflex microscopic     Status: Abnormal   Collection Time: 10/21/15 11:51 AM  Result Value Ref Range   Color, Urine AMBER (A) YELLOW    Comment: BIOCHEMICALS MAY BE AFFECTED BY COLOR   APPearance CLEAR CLEAR   Specific Gravity, Urine 1.013 1.005 - 1.030   pH 7.5 5.0 - 8.0   Glucose, UA >1000 (A) NEGATIVE mg/dL   Hgb urine dipstick NEGATIVE NEGATIVE   Bilirubin Urine NEGATIVE NEGATIVE    Ketones, ur NEGATIVE NEGATIVE mg/dL   Protein, ur >300 (A) NEGATIVE mg/dL   Nitrite NEGATIVE NEGATIVE   Leukocytes, UA NEGATIVE NEGATIVE  Urine microscopic-add on     Status: Abnormal   Collection Time: 10/21/15 11:51 AM  Result Value Ref Range   Squamous Epithelial / LPF 0-5 (A) NONE SEEN   WBC, UA 0-5 0 - 5 WBC/hpf   RBC / HPF 0-5 0 - 5 RBC/hpf   Bacteria, UA RARE (A) NONE SEEN   Urine-Other YEAST PRESENT   CBG monitoring, ED     Status: Abnormal   Collection Time: 10/21/15  2:44 PM  Result Value Ref Range   Glucose-Capillary 266 (H) 65 - 99 mg/dL  Magnesium     Status: None   Collection Time: 10/21/15  5:07 PM  Result Value Ref Range   Magnesium 2.4 1.7 - 2.4 mg/dL  Glucose, capillary     Status: Abnormal   Collection Time: 10/21/15 10:07 PM  Result Value Ref Range   Glucose-Capillary 331 (H) 65 - 99 mg/dL   Comment 1 Notify RN    Comment 2 Document in Chart   CBC     Status: Abnormal   Collection Time: 10/22/15  4:43 AM  Result Value Ref Range   WBC 9.6 4.0 - 10.5 K/uL   RBC 4.18 3.87 - 5.11 MIL/uL   Hemoglobin 11.5 (L) 12.0 - 15.0 g/dL   HCT 37.1 36.0 - 46.0 %   MCV 88.8 78.0 - 100.0 fL  MCH 27.5 26.0 - 34.0 pg   MCHC 31.0 30.0 - 36.0 g/dL   RDW 16.8 (H) 11.5 - 15.5 %   Platelets 231 150 - 400 K/uL  Renal function panel     Status: Abnormal   Collection Time: 10/22/15  4:43 AM  Result Value Ref Range   Sodium 138 135 - 145 mmol/L   Potassium 4.0 3.5 - 5.1 mmol/L   Chloride 102 101 - 111 mmol/L   CO2 21 (L) 22 - 32 mmol/L   Glucose, Bld 379 (H) 65 - 99 mg/dL   BUN 55 (H) 6 - 20 mg/dL   Creatinine, Ser 6.29 (H) 0.44 - 1.00 mg/dL   Calcium 9.2 8.9 - 10.3 mg/dL   Phosphorus 4.7 (H) 2.5 - 4.6 mg/dL   Albumin 3.3 (L) 3.5 - 5.0 g/dL   GFR calc non Af Amer 7 (L) >60 mL/min   GFR calc Af Amer 8 (L) >60 mL/min    Comment: (NOTE) The eGFR has been calculated using the CKD EPI equation. This calculation has not been validated in all clinical situations. eGFR's  persistently <60 mL/min signify possible Chronic Kidney Disease.    Anion gap 15 5 - 15  Glucose, capillary     Status: Abnormal   Collection Time: 10/22/15  7:53 AM  Result Value Ref Range   Glucose-Capillary 437 (H) 65 - 99 mg/dL   ROS:  Only positives as in HPI  Physical Exam: Vitals:   10/22/15 0809 10/22/15 1013  BP: (!) 194/76 (!) 230/76  Pulse: 72   Resp: (!) 26   Temp: 98.3 F (36.8 C)      General: alert  AA F, NAD  OX 3/ pleasant  HEENT: [place] , MMM, EOMI Neck: no jvd  Heart: RRR , No Mur , rub or gallop Lungs:  CTA bilat , non labored breathing  Abdomen: BS pos, soft , non tender, nondistended  Extremities: no pedal edema  Skin: warm dry , no overt rash  Neuro: oX3  No acute overt focal deficits noted  Dialysis Access: pos bruit L UA AVF with ecchymosis/ L IJ Perm cath   Dialysis Orders: Center: [facility]  on MWF . EDW 73.5  HD Bath 2k, 2.25 ca  Time 4hr 81mn  [facility]. [facility] ( 06/27/15 insert)      Calcitriol 0.5 mcg  Po q hd        Venofer  52mq weekly hd  Other iop labs hgb 11.8  10/18/1  Ca 9.5 phos 4.7  pth 363   Assessment/Plan 1. Nausea/ Vomiting  Prob. Gastroparesis - per admit team  2. ESRD -  HD sec missed hd yest / then back on s missed bp meds ,/ - bp meds  no excess vol on exam or cxr  Needs stand wt  Hd today  3. Anemia  - hgb 11.5 no esa fe weekly hd  4. Metabolic bone disease -   Po vit d on hd and binder ac  5. DM type 2 - per admit   DaErnest HaberPA-C [id number]/21/2017, 11:11 AM   Pt seen, examined and agree w A/P as above.  RoKelly SplinterD CaNewell Rubbermaidager [phone] 10/22/2015, 2:03 PM

## 2015-10-22 NOTE — Progress Notes (Signed)
Patient shakes and this increases b/p when it is taking. RN has asked patient to try to hold still a few times with little to no success. When patient is still, b/p is back to normal. Will continue to monitor

## 2015-10-22 NOTE — Procedures (Signed)
  I was present at this dialysis session, have reviewed the session itself and made  appropriate changes Vinson Moselleob Exavier Lina MD [facility] pager [phone]   10/22/2015, 2:04 PM

## 2015-10-22 NOTE — Progress Notes (Signed)
Subjective:  Nausea and vomiting has improved. Having several small, solid and hard bowel movements overnight. She denies any headaches, vision changes, lightheadedness/dizziness, chest pain, nausea, vomiting or abdominal pain. She is hungry and would like to eat.   Objective:  Vital signs in last 24 hours: Vitals:   10/21/15 1700 10/21/15 2210 10/22/15 0553 10/22/15 0809  BP: (!) 213/95 (!) 154/59 (!) 226/72 (!) 194/76  Pulse: 86 89 76 72  Resp: 16 19 18  (!) 26  Temp:  98.9 F (37.2 C) 98.7 F (37.1 C) 98.3 F (36.8 C)  TempSrc:  Oral Oral Oral  SpO2: 100% 100% 100%   Weight:      Height:       Physical Exam Constitutional: Lying comfortably in bed Cardiovascular: RRR, no murmurs, rubs, or gallops.  Pulmonary/Chest: CTAB, no wheezes, rales, or rhonchi. Abdominal: Soft, non tender, non distended. +BS.  Extremities: Warm and well perfused. Distal pulses intact. No edema.  Neurological: A&Ox3, CN II - XII grossly intact.  Skin: No rashes or erythema Psychiatric: Normal mood and affect  Assessment/Plan:  Nausea and vomiting:  She presented yesterday afternoon with complaints of nausea with multiple episode of vomiting. Unable to tolerate PO intake and vomiting up her medications so she came in to the ED to be evaluated. She denies any history of gastroparesis. Tells me that she has had issues with early satiety in the past but has not has this problem in several years. She does say that she has chronic constipation that she believes causes her vomiting episodes. She currently takes Colace 400 mg daily for her constipation. Tells me prior to admission her last bowel movement was 5 days prior. She tells me she has had 4-5 solid round hard stools since admission. Her nausea and vomiting have resolved and she has not thrown up since reaching the floor. She is hungry and would like to eat this morning. Likely multifactorial. She has some underlying gastroparesis given her longstanding  DM (diagnosed age 45) and history of early satiety. Possibly some component of chronic constipation as well (tells me she can go 'weeks' without a BM) and symptoms have resolved with bowel movements overnight. Will hold her Colace today but will likely need a better constipation regimen at discharge.  -D/C Reglan -Zofran prn nausea - limit as much as possible -D/C IV Pepcid as H2 antagonists can delay gastric emptying  -Consider gastric emptying study outpatient -Hold Colace as now having several bowel movements  Uncontrolled hypertension: BP was >200 systolic on admission. She was unable to tolerate her PO home medications and was started on Hydralazine 10 mg IV q8hr scheduled and received labetalol 10 mg IV once. BP was improved overnight but this morning her BP was 220s systolic.  Received IV hydralazine with improvement only to 194/76. Ordered labetalol 10 mg IV x1 and repeat blood pressures are now 230/76. Currently asymptomatic.  She tells me that she was told to stop her clonidine 0.3 mg PO tid a week and a half ago at dialysis (no taper) and then had her hydralazine 10 mg tid reduced to 5 mg tid at her 10/18 dialysis session. Possibly secondary to rebound hypertension from stopping clonidine although reports her last dose was 10 days ago. She indicated to me today that she did not feel that she was ready to come off blood pressure. Unclear when she last took her clonidine but missed all of her medications yesterday due to N/V. Would avoid calcium channel blockers as they  can delay gastric emptying and may contribute to her nausea and vomiting. Missed her dialysis session yesterday. Dry weight is around 174 lbs. She is 175 lbs and does not appear to be volume overloaded on exam today. Spoke with Nephrology who will take her for dialysis later this afternoon and agreed with clonidine patch.  -Will go for dialysis session today which may improve BP. Will be careful dropping her too low prior to  dialysis.  -Hydralazine IV 10 mg q6hr prn SBP >180 -Clonidine patch 0.1 mg qday for possible rebound HTN -Re-assess BP after dialysis and consider restarting PO BP medications -Appreciate nephrology assistance  -Will transfer to SDU given uncontrolled HTN for closer monitoring  Chronic constipation She takes Colace 400 mg daily at home. Had constipation for 5 days prior to admission. Now has had 4-5 solid, hard stools since admission. Will hold colace as she is now concerned about having so frequent bowel movements. Will likely need a better regimen for constipation prior to discharge.   Type 2 diabetes mellitus She has had difficulties controlling her CBGs since starting dialysis back in August. She has already been admitted twice this month for DKA. Her CBGs were in the 400s on admission but there was no AG or ketones in her urine. She was started on Lantus 6 units (12 units at home) and SSI-s. CBGs have remained in the 300-400s. Fasting glucose was 331 this morning. She denies any symptoms today. Her A1c was 7.0 on 10/11, however it is likely falsely low given her ESRD status.  -Increase Lantus to 12 Units qhs tonight -Change from SSI-s to SSI-moderate -Novolog 3 units tid with meals  ESRD on dialysis Dry weight ~174lbs per patient. Weight is 175 lbs today. Does not appear to be volume overloaded. On MWF schedule. Missed yesterdays session. Will go for dialysis this afternoon. -Appreciate nephrology assistance   Dispo: Anticipated discharge in approximately 1-2 day(s).   Valentino Nose, MD 10/22/2015, 9:58 AM Pager: [phone]

## 2015-10-22 NOTE — Progress Notes (Signed)
MD order for transfer to stepdown d/t elevated bp. Received room for 4e20. Report called to [place]. Patient to transfer to 4e20 after hemodialysis treatment. Bess KindsGWALTNEY, Olie Dibert B, RN

## 2015-10-23 LAB — RENAL FUNCTION PANEL
ANION GAP: 15 (ref 5–15)
Albumin: 3.2 g/dL — ABNORMAL LOW (ref 3.5–5.0)
BUN: 26 mg/dL — ABNORMAL HIGH (ref 6–20)
CHLORIDE: 99 mmol/L — AB (ref 101–111)
CO2: 23 mmol/L (ref 22–32)
Calcium: 8.9 mg/dL (ref 8.9–10.3)
Creatinine, Ser: 4.31 mg/dL — ABNORMAL HIGH (ref 0.44–1.00)
GFR calc non Af Amer: 11 mL/min — ABNORMAL LOW (ref >60)
GFR, EST AFRICAN AMERICAN: 13 mL/min — AB (ref >60)
GLUCOSE: 301 mg/dL — AB (ref 65–99)
Phosphorus: 4 mg/dL (ref 2.5–4.6)
Potassium: 4.2 mmol/L (ref 3.5–5.1)
Sodium: 137 mmol/L (ref 135–145)

## 2015-10-23 LAB — GLUCOSE, CAPILLARY
GLUCOSE-CAPILLARY: 97 mg/dL (ref 65–99)
Glucose-Capillary: 258 mg/dL — ABNORMAL HIGH (ref 65–99)

## 2015-10-23 MED ORDER — INSULIN ASPART 100 UNIT/ML ~~LOC~~ SOLN: 5.0000 [IU] | Freq: Three times a day (TID) | SUBCUTANEOUS | 0 refills | Status: DC

## 2015-10-23 MED ORDER — INSULIN GLARGINE 100 UNIT/ML ~~LOC~~ SOLN: 15.0000 [IU] | Freq: Every day | SUBCUTANEOUS | 3 refills | Status: DC

## 2015-10-23 MED ORDER — HYDRALAZINE HCL 20 MG/ML IJ SOLN
10.0000 mg | Freq: Four times a day (QID) | INTRAMUSCULAR | Status: DC | PRN
  Administered 2015-10-23: 10 mg via INTRAVENOUS
  Filled 2015-10-23: qty 1

## 2015-10-23 MED ORDER — CLONIDINE HCL 0.1 MG PO TABS: 0.1000 mg | ORAL_TABLET | Freq: Three times a day (TID) | ORAL | 0 refills | Status: DC

## 2015-10-23 MED ORDER — INSULIN ASPART 100 UNIT/ML ~~LOC~~ SOLN: 5.0000 [IU] | Freq: Three times a day (TID) | SUBCUTANEOUS | Status: DC

## 2015-10-23 MED ORDER — DILTIAZEM HCL ER 60 MG PO CP12
120.0000 mg | ORAL_CAPSULE | Freq: Two times a day (BID) | ORAL | Status: DC
  Administered 2015-10-23: 120 mg via ORAL
  Filled 2015-10-23 (×2): qty 2

## 2015-10-23 MED ORDER — HYDRALAZINE HCL 50 MG PO TABS: 50.0000 mg | ORAL_TABLET | Freq: Three times a day (TID) | ORAL | 0 refills | Status: DC

## 2015-10-23 MED ORDER — INSULIN GLARGINE 100 UNIT/ML ~~LOC~~ SOLN
15.0000 [IU] | Freq: Every day | SUBCUTANEOUS | Status: DC
  Filled 2015-10-23: qty 0.15

## 2015-10-23 MED ORDER — DOCUSATE SODIUM 100 MG PO CAPS
400.0000 mg | ORAL_CAPSULE | Freq: Every day | ORAL | Status: DC
  Filled 2015-10-23: qty 4

## 2015-10-23 MED ORDER — HYDRALAZINE HCL 20 MG/ML IJ SOLN
10.0000 mg | Freq: Four times a day (QID) | INTRAMUSCULAR | Status: DC | PRN
Start: 2015-10-23 — End: 2015-10-23

## 2015-10-23 MED ORDER — POLYETHYLENE GLYCOL 3350 17 G PO PACK: 17.0000 g | PACK | Freq: Every day | ORAL | Status: DC | PRN

## 2015-10-23 MED ORDER — ONDANSETRON 8 MG PO TBDP: 8.0000 mg | ORAL_TABLET | Freq: Three times a day (TID) | ORAL | 0 refills | Status: DC | PRN

## 2015-10-23 MED ORDER — POLYETHYLENE GLYCOL 3350 17 G PO PACK: 17.0000 g | PACK | Freq: Every day | ORAL | 0 refills | Status: DC | PRN

## 2015-10-23 MED ORDER — DILTIAZEM HCL ER 60 MG PO CP12: 120.0000 mg | ORAL_CAPSULE | Freq: Two times a day (BID) | ORAL | Status: DC

## 2015-10-23 NOTE — Discharge Summary (Signed)
Name: Crystal Tanner MRN: [id number] DOB: July 06, 1970 45 y.o. PCP: Boykin Nearing, MD  Date of Admission: 10/21/2015 11:01 AM Date of Discharge: 10/23/2015 Attending Physician: Aldine Contes, MD  Discharge Diagnosis: Principal Problem:   Uncontrolled hypertension Active Problems:   Chronic constipation   Nausea and vomiting   Type 2 diabetes mellitus ([place])   ESRD on dialysis ([place])   Hyperglycemia   Discharge Medications:   Medication List    STOP taking these medications   furosemide 40 MG tablet Commonly known as:  LASIX   metoCLOPramide 10 MG tablet Commonly known as:  REGLAN   promethazine 25 MG suppository Commonly known as:  PHENERGAN     TAKE these medications   calcitRIOL 0.25 MCG capsule Commonly known as:  ROCALTROL Take 0.25 mcg by mouth every Monday, Wednesday, and Friday.   cloNIDine 0.1 MG tablet Commonly known as:  CATAPRES Take 1 tablet (0.1 mg total) by mouth 3 (three) times daily. What changed:  medication strength  how much to take   diltiazem 180 MG 24 hr capsule Commonly known as:  CARDIZEM CD TAKE 2 CAPSULES BY MOUTH DAILY.   docusate sodium 100 MG capsule Commonly known as:  COLACE Take 4 capsules (400 mg total) by mouth daily.   glucose blood test strip Commonly known as:  FREESTYLE TEST STRIPS Use as instructed   glucose blood test strip Commonly known as:  TRUE METRIX BLOOD GLUCOSE TEST Use as instructed   hydrALAZINE 50 MG tablet Commonly known as:  APRESOLINE Take 1 tablet (50 mg total) by mouth 3 (three) times daily. What changed:  See the new instructions.   insulin aspart 100 UNIT/ML injection Commonly known as:  novoLOG As per sliding scale What changed:  Another medication with the same name was added. Make sure you understand how and when to take each.   insulin aspart 100 UNIT/ML injection Commonly known as:  novoLOG Inject 5 Units into the skin 3 (three) times daily with meals. What changed:  You were  already taking a medication with the same name, and this prescription was added. Make sure you understand how and when to take each.   insulin glargine 100 UNIT/ML injection Commonly known as:  LANTUS Inject 0.15 mLs (15 Units total) into the skin at bedtime. What changed:  how much to take   multivitamin Tabs tablet Take 1 tablet by mouth at bedtime.   ondansetron 8 MG disintegrating tablet Commonly known as:  ZOFRAN ODT Take 1 tablet (8 mg total) by mouth every 8 (eight) hours as needed for nausea or vomiting.   oxyCODONE-acetaminophen 5-325 MG tablet Commonly known as:  PERCOCET/ROXICET Take 1-2 tablets by mouth every 6 (six) hours as needed for severe pain.   polyethylene glycol packet Commonly known as:  MIRALAX / GLYCOLAX Take 17 g by mouth daily as needed for moderate constipation.   sodium bicarbonate 650 MG tablet Take 1 tablet (650 mg total) by mouth 3 (three) times daily.   TRUE METRIX METER w/Device Kit 1 each by Does not apply route 3 (three) times daily.   TRUEPLUS LANCETS 28G Misc 1 each by Does not apply route 3 (three) times daily.       Disposition and follow-up:   [name] was discharged from [facility] in Good condition.  At the hospital follow up visit please address:  1.  N/V - possibly related to gastroparesis. Consider gastric emptying study.  2. HTN - presusres upwards of 031 systolic on  arrival. Improved to 150s after resuming clonidine. Suspect some rebound HTN component. Discharged on hydralazine 50 mg tid, diltiazem 180 mg daily, clonidine 0.1 mg tid 3. DM - Increased Lantus to 15 units qhs and meal time coverage to 5 units qac  Labs / imaging needed at time of follow-up: None Pending labs/ test needing follow-up: None  Follow-up Appointments: Follow-up Information    Minerva Ends, MD Follow up on 11/01/2015.   Specialty:  Family Medicine Contact information: [place]  [address] [place] Hospital Course by problem list: Principal Problem:   Uncontrolled hypertension Active Problems:   Chronic constipation   Nausea and vomiting   Type 2 diabetes mellitus ([place])   ESRD on dialysis ([facility])   Hyperglycemia   Nausea and vomiting:  Chronic and recurring problem. She believes her symptoms are related to her chronic constipation. Symptoms improved following several hard, small bowel movements. Likely some degree of underlying gastroparesis. Blood pressures were also uncontrolled and may have been contributory. Continue home Zofran. Consider outpatient gastric emptying study.   Uncontrolled hypertension: Blood pressures were difficult to control and were around 371 systolic. After resuming her home hydralazine 50 mg tid and Cardizem 120 mg bid and adding back a 0.1 mg clonidine path, her blood pressures improved to the 062I systolic. Suspect some rebound component 2/2 clonidine discontinuation. Will continue her home hydralazine, Cardizem and resume clonidine at 0.1 mg tid at discharge.   Chronic constipation Will resume home cloacae and asdd Miralax prn.  Type 2 diabetes mellitus CBGs were difficult to control, staying in the 300-400 range after resuming her home regimen. Increased her Lantus to 15 units qhs and her meal time Novolog to 5 units with additional SSI. Asked her to check her sugars 4 times daily at home and bring for review at her PCP visit.   ESRD on dialysis Received inpatient dialysis. Discharged at weight of 164 lbs. To resume regular dialysis sessions after discharge.    Discharge Vitals:   BP (!) 157/48   Pulse 84   Temp 97.8 F (36.6 C) (Oral)   Resp 17   Ht 5' 7"  (1.702 m)   Wt 164 lb 10.9 oz (74.7 kg) Comment: pt states she is too nausated to move  LMP 09/29/2015   SpO2 100%   BMI 25.79 kg/m     Discharge Instructions: Discharge Instructions    Call MD for:  difficulty breathing, headache or visual  disturbances    Complete by:  As directed    Call MD for:  persistant dizziness or light-headedness    Complete by:  As directed    Call MD for:  persistant nausea and vomiting    Complete by:  As directed    Diet - low sodium heart healthy    Complete by:  As directed    Increase activity slowly    Complete by:  As directed       Signed: Maryellen Pile, MD 10/25/2015, 8:13 PM   Pager: [phone]

## 2015-10-23 NOTE — Discharge Instructions (Signed)
Ms. Warf,  For your blood pressure, take the clonidine 0.1 mg three times a day, your Cardizem nightly and the Hydralazine 50 mg three times a day. Check your blood pressure 1-2 times a day and keep a log for your primary care physician and nephrologist to review. They can adjust your medications based off your home readings.   For your diabetes, I am going to increase your Lantus dose from 12 units at night to 15 units nightly. I am also going to increase your dose of meal time insulin to 5 units of Novolog with meals. Continue your sliding scale adjustments as before. Please check your blood sugars before every meal and before bed every night. Bring this to your visit with Dr. Armen Pickup to review.

## 2015-10-23 NOTE — Progress Notes (Signed)
Discharged patient to home in stable condition.  Discharge instructions and prescription for Lantus  and Zofran is given to patient.

## 2015-10-23 NOTE — Progress Notes (Signed)
[facility] Progress Note   Subjective: feels better today, N/V better, BP's down. She says was taken off some BP meds recently and BP went up and "made me sick".    Vitals:   10/23/15 0639 10/23/15 0700 10/23/15 0810 10/23/15 1135  BP: (!) 161/82  (!) 156/60 (!) 142/78  Pulse: 84  70   Resp: 11  17   Temp:  99.5 F (37.5 C) 99.5 F (37.5 C)   TempSrc:  Oral Oral   SpO2: 100%  100%   Weight:      Height:        Inpatient medications: . [START ON 10/24/2015] calcitRIOL  0.5 mcg Oral Q M,W,F-HD  . cloNIDine  0.1 mg Transdermal Weekly  . diltiazem  120 mg Oral Q12H  . docusate sodium  400 mg Oral Daily  . [START ON 10/26/2015] ferric gluconate (FERRLECIT/NULECIT) IV  62.5 mg Intravenous Q Wed-HD  . heparin  5,000 Units Subcutaneous Q8H  . hydrALAZINE  50 mg Oral Q8H  . insulin aspart  0-15 Units Subcutaneous TID WC  . insulin aspart  3 Units Subcutaneous TID WC  . insulin glargine  12 Units Subcutaneous QHS  . multivitamin  1 tablet Oral QHS  . sodium bicarbonate  650 mg Oral TID  . sodium chloride flush  3 mL Intravenous Q12H     ondansetron (ZOFRAN) IV, ondansetron, polyethylene glycol  Exam: Alert, looks stable No jvd Chest clear  RRR no mrg Abd soft pos bs ntnd Ext no edema LUA AVF +bruit/ L IJ perm cath  Dialysis: [facility] MWF  4h  2/2.25 bath  73.5kg  Hep 7800   L IJ PCath/ LUA AVF (06/27/15 insert) Calcitriol 0.5 mcg  Po q hd        Venofer  50mg  q weekly hd  Other iop labs hgb 11.8  10/18/1  Ca 9.5 phos 4.7  pth 363       Assessment: 1. Nausea/ vomiting - better 2. Uncont HTN - better, on clon patch/ hydral / dilt. Says she has severe HTN usual BP's 200's, good control is in the 170's.  Better today.  3. ESRD recent start to HD, close to dry wt 4. DM2 per prim 5. MBD cont meds  Plan - as above, HD tomorrow, UF to dry wt   Vinson Moselle MD [facility] pager [phone]   10/23/2015, 12:00 PM    Recent Labs Lab  10/21/15 1125 10/21/15 1136 10/22/15 0443 10/23/15 0144  NA 138 140 138 137  K 3.8 3.8 4.0 4.2  CL 105 105 102 99*  CO2 21*  --  21* 23  GLUCOSE 400* 390* 379* 301*  BUN 48* 47* 55* 26*  CREATININE 5.79* 5.90* 6.29* 4.31*  CALCIUM 9.4  --  9.2 8.9  PHOS  --   --  4.7* 4.0    Recent Labs Lab 10/21/15 1125 10/22/15 0443 10/23/15 0144  AST 24  --   --   ALT 22  --   --   ALKPHOS 116  --   --   BILITOT 0.7  --   --   PROT 6.7  --   --   ALBUMIN 3.5 3.3* 3.2*    Recent Labs Lab 10/21/15 1125 10/21/15 1136 10/22/15 0443  WBC 7.5  --  9.6  NEUTROABS 6.3  --   --   HGB 11.3* 12.6 11.5*  HCT 35.6* 37.0 37.1  MCV 88.3  --  88.8  PLT  211  --  231   Iron/TIBC/Ferritin/ %Sat    Component Value Date/Time   IRON 44 06/22/2015 0931   TIBC 302 06/22/2015 0931   FERRITIN 10 (L) 06/22/2015 0931   IRONPCTSAT 15 06/22/2015 0931

## 2015-10-23 NOTE — Progress Notes (Signed)
   Subjective:  She developed some more nausea overnight but no further vomiting. Says that her nausea improved following receiving Zofran IV. Blood pressures were elevated overnight but denies any other symptoms. No headaches, vision changes, SOB. Eating and drinking. Tells me that her blood pressures have been elevated whenever they check it in her right arm (left arm with fistula site) but improves when they check it in her legs.   Objective:  Vital signs in last 24 hours: Vitals:   10/23/15 0623 10/23/15 0639 10/23/15 0700 10/23/15 0810  BP: (!) 199/76 (!) 161/82  (!) 156/60  Pulse:  84  70  Resp:  11  17  Temp:   99.5 F (37.5 C) 99.5 F (37.5 C)  TempSrc:   Oral Oral  SpO2:  100%  100%  Weight:      Height:       Constitutional: Lying comfortably in bed Cardiovascular:RRR, no murmurs, rubs, or gallops.  Pulmonary/Chest:CTAB, no wheezes, rales, or rhonchi. Abdominal:Soft, non tender, non distended. +BS.  Extremities: Warm and well perfused. Distal pulses intact. No edema.  Neurological:A&Ox3, CN II - XII grossly intact.  Ext: no edema Psychiatric:Normal mood and affect  Assessment/Plan:  Nausea and vomiting:  Symptoms improving. Did have some recurrent nausea last night but improved following IV Zofran. No emesis. She is eating and drinking without difficulty. This is most likely multifactorial. Possibly some underlying undiagnosed gastroparesis. She also has chronic constipation that she reports are temporally related and symptoms resolve after she begins having BMs. No further stools yesterday after having 3-4 stools the day prior. -Zofran prn -Consider gastric emptying study outpatient -Resume home dose Colace 400 mg daily -Miralax prn  Uncontrolled hypertension: Blood pressures remained elevated yesterday despite clonidine patch and dialysis. Remained around 200 systolic. Resumed her home hydralazine 50 mg tid and Cardizem 120 mg bid overnight. Blood pressures  are much improved this morning in the 150s systolic. She tells me that whenever they have checked her pressures in her right arm they have been persistently elevated but when the moved the BP cuff to her leg the readings have been better. Manual check on her right arm this morning shows a BP of 142/78, consistent with her leg BP cuff readings today. Reassuring that she does not have coarctation. Good intact pulses in her feet bilaterally. Nursing reports state that she was moving a lot during automatic BP checks and BP improves when they can keep her still.  -Continue home hydralazine, Cardizem -Continue clonidine patch, will resume lower home dose clonidine 0.1 td at discharge to prevent rebound and will need to be weaned off slowly  Chronic constipation No BM yesterday after having several following admission. Will resume home cloacae. Miralax prn.  Type 2 diabetes mellitus CBG 301 this morning after increasing to her home dose Lantus last night. She received 41 units of novolog yesterday as well. CBGs remained in the 300-400s for most of the day with one dip to 183 in the afternoon. Unclear why her sugars have become so difficult to control. She reports this began after starting dialysis earlier this year.  -Increase Lantus to 15 Units qhs tonight -Continue SSI-moderate -Novolog 5 units tid with meals  ESRD on dialysis Received dialysis yesterday. On MWF schedule.  -Appreciate nephrology assistance   Dispo: Anticipated discharge this afternoon or tomorrow.   Valentino NoseNathan Chrystopher Stangl, MD 10/23/2015, 11:27 AM Pager: [phone]

## 2015-10-23 NOTE — Progress Notes (Signed)
Patient was able to rest hand still- b/p still elevated at this time (evening b/p medication given). Will notify MD.

## 2015-10-23 NOTE — Progress Notes (Signed)
Spoke with internal medicine teaching service concerning patient's blood pressure before and after PRN hydralazine. Resident will look at parameters and make changes as needed for patient. Reported that she will call this RN if any changes are made.

## 2015-10-24 LAB — GLUCOSE, CAPILLARY: GLUCOSE-CAPILLARY: 291 mg/dL — AB (ref 65–99)

## 2015-10-24 MED FILL — cloNIDine HCL 0.1 MG TABS: 0.1 | 30 days supply | Qty: 90 | Fill #0

## 2015-10-24 MED FILL — !NOVOLOG 100UNITS/ML VIAL: 100/ML | 60 days supply | Qty: 10 | Fill #0

## 2015-10-24 MED FILL — hydrALAZINE HCL 50 MG TABS: 50 | 30 days supply | Qty: 90 | Fill #0

## 2015-10-24 MED FILL — POLYETHYLENE GLYCOL 3350: 15 days supply | Qty: 255 | Fill #0

## 2015-10-25 MED FILL — TRUE METRIX TEST STRIP: 12 days supply | Qty: 50 | Fill #8

## 2015-10-26 LAB — CULTURE, BLOOD (ROUTINE X 2)
Culture: NO GROWTH
Culture: NO GROWTH

## 2015-11-01 ENCOUNTER — Encounter: Payer: Self-pay | Admitting: Family Medicine

## 2015-11-01 ENCOUNTER — Other Ambulatory Visit: Payer: Self-pay

## 2015-11-01 ENCOUNTER — Ambulatory Visit: Payer: Medicaid Other | Attending: Family Medicine | Admitting: Family Medicine

## 2015-11-01 VITALS — BP 134/70 | HR 69 | Temp 98.6°F | Ht 67.0 in | Wt 161.6 lb

## 2015-11-01 DIAGNOSIS — E1122 Type 2 diabetes mellitus with diabetic chronic kidney disease: Secondary | ICD-10-CM | POA: Insufficient documentation

## 2015-11-01 DIAGNOSIS — Z992 Dependence on renal dialysis: Secondary | ICD-10-CM | POA: Diagnosis not present

## 2015-11-01 DIAGNOSIS — E118 Type 2 diabetes mellitus with unspecified complications: Secondary | ICD-10-CM | POA: Diagnosis not present

## 2015-11-01 DIAGNOSIS — Z794 Long term (current) use of insulin: Secondary | ICD-10-CM | POA: Diagnosis not present

## 2015-11-01 DIAGNOSIS — I150 Renovascular hypertension: Secondary | ICD-10-CM

## 2015-11-01 DIAGNOSIS — Z79899 Other long term (current) drug therapy: Secondary | ICD-10-CM | POA: Diagnosis not present

## 2015-11-01 DIAGNOSIS — R112 Nausea with vomiting, unspecified: Secondary | ICD-10-CM | POA: Diagnosis not present

## 2015-11-01 DIAGNOSIS — Z87891 Personal history of nicotine dependence: Secondary | ICD-10-CM | POA: Insufficient documentation

## 2015-11-01 DIAGNOSIS — I120 Hypertensive chronic kidney disease with stage 5 chronic kidney disease or end stage renal disease: Secondary | ICD-10-CM | POA: Insufficient documentation

## 2015-11-01 DIAGNOSIS — N185 Chronic kidney disease, stage 5: Secondary | ICD-10-CM | POA: Diagnosis not present

## 2015-11-01 DIAGNOSIS — E1165 Type 2 diabetes mellitus with hyperglycemia: Secondary | ICD-10-CM | POA: Insufficient documentation

## 2015-11-01 DIAGNOSIS — N186 End stage renal disease: Secondary | ICD-10-CM | POA: Insufficient documentation

## 2015-11-01 DIAGNOSIS — R109 Unspecified abdominal pain: Secondary | ICD-10-CM | POA: Insufficient documentation

## 2015-11-01 LAB — GLUCOSE, POCT (MANUAL RESULT ENTRY): POC Glucose: 133 mg/dl — AB (ref 70–99)

## 2015-11-01 MED ORDER — GLUCOSE BLOOD VI STRP: ORAL_STRIP | 12 refills | Status: DC

## 2015-11-01 MED ORDER — GLUCOSE BLOOD VI STRP: 1.0000 | ORAL_STRIP | Freq: Three times a day (TID) | 12 refills | Status: DC

## 2015-11-01 MED ORDER — ACCU-CHEK AVIVA PLUS W/DEVICE KIT: 1.0000 | PACK | Freq: Three times a day (TID) | 0 refills | Status: DC

## 2015-11-01 MED ORDER — TRUE METRIX METER DEVI: 1.0000 | Freq: Three times a day (TID) | 0 refills | Status: AC

## 2015-11-01 MED ORDER — ONDANSETRON 8 MG PO TBDP: 8.0000 mg | ORAL_TABLET | Freq: Three times a day (TID) | ORAL | 2 refills | Status: DC | PRN

## 2015-11-01 MED ORDER — ACCU-CHEK SOFTCLIX LANCETS MISC: 1.0000 | Freq: Three times a day (TID) | 12 refills | Status: DC

## 2015-11-01 MED ORDER — TRUEPLUS LANCETS 28G MISC: 1.0000 | Freq: Three times a day (TID) | 11 refills | Status: AC

## 2015-11-01 MED FILL — TRUE METRIX TEST STRIP: 30 days supply | Qty: 100 | Fill #0

## 2015-11-01 MED FILL — ONDANSETRON ODT 8 MG TABLET: 8 | 10 days supply | Qty: 30 | Fill #0

## 2015-11-01 NOTE — Progress Notes (Signed)
Pt is here today for hospital follow up. Pt was seen in the ED for HTN and diabetes.  Pt needs refill on test strips.  Pt declined flu shot.

## 2015-11-01 NOTE — Patient Instructions (Addendum)
Crystal Tanner was seen today for hospitalization follow-up.  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 -     glucose blood (ACCU-CHEK AVIVA PLUS) test strip; 1 each by Other route 3 (three) times daily. -     ACCU-CHEK SOFTCLIX LANCETS lancets; 1 each by Other route 3 (three) times daily.  Nausea and vomiting in adult -     ondansetron (ZOFRAN ODT) 8 MG disintegrating tablet; Take 1 tablet (8 mg total) by mouth every 8 (eight) hours as needed for nausea or vomiting.  Other orders -     Discontinue: glucose blood (TRUE METRIX BLOOD GLUCOSE TEST) test strip; 1 each by Other route 3 (three) times daily.  continue current regimen. Follow up in 3 month for diabetes and HTN  I strongly recommend the flu vaccine, if you decide to get in you may come here or get it in dialysis.   Dr. Adrian Blackwater

## 2015-11-01 NOTE — Progress Notes (Signed)
Subjective:  Patient ID: Crystal Tanner, female    DOB: 12-13-1970  Age: 45 y.o. MRN: [id number]  CC: Hospitalization Follow-up   HPI Svea Meleski has diabetes, HTN ESRD-DD (since 08/2015) (MWF), chronic constipation she presents for    1. HFU: she has been hospitalized twice over the last month. Once for hyperglycemia and another time for HTN. She reports that her blood pressure medications were adjusted at dialysis and this made her sick with nausea, vomiting, abdominal cramping and more frequent stools.   Social History  Substance Use Topics  . Smoking status: Former Smoker    Packs/day: 0.25    Years: 15.00    Quit date: 08/14/2015  . Smokeless tobacco: Never Used  . Alcohol use No     Comment: less than a ppd, "not much"    Outpatient Medications Prior to Visit  Medication Sig Dispense Refill  . Blood Glucose Monitoring Suppl (TRUE METRIX METER) w/Device KIT 1 each by Does not apply route 3 (three) times daily. 1 kit 0  . calcitRIOL (ROCALTROL) 0.25 MCG capsule Take 0.25 mcg by mouth every Monday, Wednesday, and Friday.  5  . cloNIDine (CATAPRES) 0.1 MG tablet Take 1 tablet (0.1 mg total) by mouth 3 (three) times daily. 90 tablet 0  . diltiazem (CARDIZEM CD) 180 MG 24 hr capsule TAKE 2 CAPSULES BY MOUTH DAILY. 60 capsule 3  . docusate sodium (COLACE) 100 MG capsule Take 4 capsules (400 mg total) by mouth daily. 30 capsule 0  . glucose blood (FREESTYLE TEST STRIPS) test strip Use as instructed 100 each 12  . glucose blood (TRUE METRIX BLOOD GLUCOSE TEST) test strip Use as instructed 100 each 12  . hydrALAZINE (APRESOLINE) 50 MG tablet Take 1 tablet (50 mg total) by mouth 3 (three) times daily. 90 tablet 0  . insulin aspart (NOVOLOG) 100 UNIT/ML injection As per sliding scale 10 mL 3  . insulin aspart (NOVOLOG) 100 UNIT/ML injection Inject 5 Units into the skin 3 (three) times daily with meals. 10 mL 0  . insulin glargine (LANTUS) 100 UNIT/ML injection Inject 0.15 mLs (15 Units  total) into the skin at bedtime. 15 mL 3  . multivitamin (RENA-VIT) TABS tablet Take 1 tablet by mouth at bedtime. 30 tablet 3  . ondansetron (ZOFRAN ODT) 8 MG disintegrating tablet Take 1 tablet (8 mg total) by mouth every 8 (eight) hours as needed for nausea or vomiting. 10 tablet 0  . oxyCODONE-acetaminophen (PERCOCET/ROXICET) 5-325 MG tablet Take 1-2 tablets by mouth every 6 (six) hours as needed for severe pain. (Patient not taking: Reported on 10/21/2015) 4 tablet 0  . polyethylene glycol (MIRALAX / GLYCOLAX) packet Take 17 g by mouth daily as needed for moderate constipation. 14 each 0  . sodium bicarbonate 650 MG tablet Take 1 tablet (650 mg total) by mouth 3 (three) times daily. (Patient not taking: Reported on 10/21/2015) 90 tablet 1  . TRUEPLUS LANCETS 28G MISC 1 each by Does not apply route 3 (three) times daily. 100 each 11   No facility-administered medications prior to visit.     ROS Review of Systems  Constitutional: Negative for chills and fever.  Eyes: Negative for visual disturbance.  Respiratory: Negative for shortness of breath.   Cardiovascular: Negative for chest pain.  Gastrointestinal: Positive for nausea. Negative for abdominal pain and blood in stool.  Musculoskeletal: Negative for arthralgias and back pain.  Skin: Negative for rash.  Allergic/Immunologic: Negative for immunocompromised state.  Hematological: Negative for adenopathy. Does not  bruise/bleed easily.  Psychiatric/Behavioral: Negative for dysphoric mood and suicidal ideas.    Objective:  BP 134/70 (BP Location: Right Arm, Patient Position: Sitting, Cuff Size: Small)   Pulse 69   Temp 98.6 F (37 C) (Oral)   Ht 5' 7"  (1.702 m)   Wt 161 lb 9.6 oz (73.3 kg)   LMP 10/18/2015   SpO2 100%   BMI 25.31 kg/m   BP/Weight 11/01/2015 10/23/2015 12/24/4973  Systolic BP 300 511 021  Diastolic BP 70 48 66  Wt. (Lbs) 161.6 164.68 -  BMI 25.31 25.79 -     Physical Exam  Constitutional: She is  oriented to person, place, and time. She appears well-developed and well-nourished. No distress.  HENT:  Head: Normocephalic and atraumatic.  Cardiovascular: Normal rate, regular rhythm, normal heart sounds and intact distal pulses.   Pulmonary/Chest: Effort normal and breath sounds normal.  Abdominal: Soft. Bowel sounds are normal. She exhibits no distension and no mass. There is no tenderness. There is no rebound and no guarding.  Musculoskeletal: She exhibits no edema.       Arms: Neurological: She is alert and oriented to person, place, and time.  Skin: Skin is warm and dry. No rash noted.  Psychiatric: She has a normal mood and affect.    Lab Results  Component Value Date   HGBA1C 7.0 (H) 10/12/2015   CBG 133 Assessment & Plan:   Rise was seen today for hospitalization follow-up.  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 -     glucose blood (ACCU-CHEK AVIVA PLUS) test strip; 1 each by Other route 3 (three) times daily. -     ACCU-CHEK SOFTCLIX LANCETS lancets; 1 each by Other route 3 (three) times daily.  Nausea and vomiting in adult -     ondansetron (ZOFRAN ODT) 8 MG disintegrating tablet; Take 1 tablet (8 mg total) by mouth every 8 (eight) hours as needed for nausea or vomiting.  Type 2 diabetes mellitus with complication, with long-term current use of insulin (HCC)  ESRD on dialysis ([place])  Renovascular hypertension  Other orders -     Discontinue: glucose blood (TRUE METRIX BLOOD GLUCOSE TEST) test strip; 1 each by Other route 3 (three) times daily.    No orders of the  defined types were placed in this encounter.   Follow-up: Return in about 3 months (around 02/01/2016) for HTN and diabetes .   Boykin Nearing MD

## 2015-11-01 NOTE — Assessment & Plan Note (Signed)
Type 2 diabetes with ESRD-DD and HTN Sugar controlled Continue current regimen Refilled zofran for prn nausea

## 2015-11-01 NOTE — Assessment & Plan Note (Signed)
A: HTN controlled  Med: compliant P: Continue current regimen  

## 2015-11-14 MED FILL — !LANTUS SOLOSTAR 100UNITS/M: 100 | 25 days supply | Qty: 3 | Fill #5

## 2015-11-14 MED FILL — !NOVOLOG 100UNITS/ML VIAL: 100/ML | 28 days supply | Qty: 10 | Fill #1

## 2015-11-14 MED FILL — DILTIAZEM 24HR ER 180 MG CA: 180 | 30 days supply | Qty: 60 | Fill #1 | Status: TO

## 2015-11-14 MED FILL — cloNIDine HCL 0.3 MG TABS: 0.3 | 30 days supply | Qty: 90 | Fill #1

## 2015-11-28 ENCOUNTER — Other Ambulatory Visit: Payer: Self-pay | Admitting: Internal Medicine

## 2015-12-01 ENCOUNTER — Other Ambulatory Visit: Payer: Self-pay | Admitting: Internal Medicine

## 2015-12-01 ENCOUNTER — Other Ambulatory Visit: Payer: Self-pay

## 2015-12-01 MED ORDER — HYDRALAZINE HCL 50 MG PO TABS: 50.0000 mg | ORAL_TABLET | Freq: Three times a day (TID) | ORAL | 0 refills | Status: DC

## 2015-12-01 MED FILL — hydrALAZINE HCL 50 MG TABS: 50 | 30 days supply | Qty: 90 | Fill #0

## 2015-12-05 ENCOUNTER — Other Ambulatory Visit: Payer: Self-pay

## 2015-12-05 DIAGNOSIS — E1122 Type 2 diabetes mellitus with diabetic chronic kidney disease: Secondary | ICD-10-CM

## 2015-12-05 DIAGNOSIS — N185 Chronic kidney disease, stage 5: Principal | ICD-10-CM

## 2015-12-05 DIAGNOSIS — Z794 Long term (current) use of insulin: Principal | ICD-10-CM

## 2015-12-05 MED ORDER — ACCU-CHEK SOFTCLIX LANCETS MISC: 1.0000 | Freq: Three times a day (TID) | 3 refills | Status: DC

## 2015-12-05 MED ORDER — ACCU-CHEK AVIVA PLUS W/DEVICE KIT: 1.0000 | PACK | Freq: Three times a day (TID) | 0 refills | Status: DC

## 2015-12-05 MED ORDER — GLUCOSE BLOOD VI STRP: 1.0000 | ORAL_STRIP | Freq: Three times a day (TID) | 3 refills | Status: DC

## 2015-12-06 MED FILL — LANTUS SOLOSTAR 100 UNITS/M: 100 | 25 days supply | Qty: 3 | Fill #6

## 2015-12-23 MED FILL — CARTIA XT 180 MG CAPSULE SA: 180 | 30 days supply | Qty: 60 | Fill #2 | Status: TO

## 2015-12-28 MED FILL — LANTUS SOLOSTAR 100 UNITS/M: 100 | 25 days supply | Qty: 3 | Fill #7

## 2015-12-28 MED FILL — NovoLOG 100 UNIT/ML SOLN: 100 | 28 days supply | Qty: 10 | Fill #2

## 2016-02-22 ENCOUNTER — Other Ambulatory Visit: Payer: Self-pay | Admitting: Family Medicine

## 2016-02-22 DIAGNOSIS — I10 Essential (primary) hypertension: Secondary | ICD-10-CM

## 2016-02-28 ENCOUNTER — Ambulatory Visit: Payer: Medicaid Other | Admitting: Podiatry

## 2016-02-28 ENCOUNTER — Other Ambulatory Visit: Payer: Self-pay | Admitting: Family Medicine

## 2016-02-28 DIAGNOSIS — E1122 Type 2 diabetes mellitus with diabetic chronic kidney disease: Secondary | ICD-10-CM

## 2016-02-28 DIAGNOSIS — N185 Chronic kidney disease, stage 5: Principal | ICD-10-CM

## 2016-02-28 DIAGNOSIS — Z794 Long term (current) use of insulin: Principal | ICD-10-CM

## 2016-03-01 ENCOUNTER — Ambulatory Visit (INDEPENDENT_AMBULATORY_CARE_PROVIDER_SITE_OTHER): Payer: Medicaid Other | Admitting: Podiatry

## 2016-03-01 VITALS — BP 120/71 | HR 69 | Resp 14 | Ht 67.0 in | Wt 161.0 lb

## 2016-03-01 DIAGNOSIS — L03032 Cellulitis of left toe: Secondary | ICD-10-CM | POA: Diagnosis not present

## 2016-03-01 MED ORDER — CEPHALEXIN 500 MG PO CAPS: 500.0000 mg | ORAL_CAPSULE | Freq: Two times a day (BID) | ORAL | 0 refills | Status: DC

## 2016-03-01 NOTE — Progress Notes (Signed)
   Subjective:    Patient ID: Crystal Tanner, female    DOB: Nov 26, 1970, 46 y.o.   MRN: [id number]  HPI 's patient presents the office with chief complaint of a painful second toenail on her left foot. He says it has been painful and becoming increasingly painful over the last 4 weeks. He has no history of trauma or reinjury to the nail .  She is a frequent patron to salons.  She says she is unsure if this nail is a fake nail or her radial nail. He denies any drainage, but has pain walking and wearing her shoes. He presents the office today for definitive evaluation and treatment of this condition    Review of Systems  Constitutional: Positive for chills and fatigue.       Sweating  Respiratory: Positive for shortness of breath.   Gastrointestinal: Positive for constipation, nausea and vomiting.  Endocrine: Positive for cold intolerance.       Excessive thirst  Genitourinary: Positive for difficulty urinating.  Musculoskeletal: Positive for gait problem.  Skin:       Change in nails  Neurological: Positive for weakness.  Hematological:       Slow to heal       Objective:   Physical Exam GENERAL APPEARANCE: Alert, conversant. Appropriately groomed. No acute distress.  VASCULAR: Pedal pulses are  palpable at  [facility] and [facility] bilateral.  Capillary refill time is immediate to all digits,  Normal temperature gradient.  Digital hair growth is present bilateral  NEUROLOGIC: sensation is normal to 5.07 monofilament at 5/5 sites bilateral.  Light touch is intact bilateral, Muscle strength normal.  MUSCULOSKELETAL: acceptable muscle strength, tone and stability bilateral.  Intrinsic muscluature intact bilateral.  Asymptomatic  HAV  B/L.   DERMATOLOGIC: skin color, texture, and turgor are within normal limits.  No preulcerative lesions or ulcers  are seen, no interdigital maceration noted.  No open lesions present.   No drainage noted.  NAILS  Thick disfigured discolored nails both feet.  Her second  toe left foot medial border is blackened and painful to the touch.  Upon debridement a pus pocket was noted medial aspect second toenail left foot.         Assessment & Plan:  Paronychia second toe left foot.  Ingrown toenail   Onychomycosis  B/L  IE  I & D second toenail left with phenolization of medial border second toe left.  Treatment options and alternatives discussed.  Recommended permanent phenol matrixectomy and patient agreed.  Second toe left foot  was prepped with alcohol and a toe block of 3cc of 2% lidocaine plain was administered in a digital toe block. .  The toe was then prepped with betadine solution .  The offending nail border was then excised and matrix tissue exposed.  Phenol was then applied to the matrix tissue followed by an alcohol wash.  Antibiotic ointment and a dry sterile dressing was applied.  The patient was dispensed instructions for aftercare. Cephalexin was prescribed.  RTC 1 week. During the procedure was noted she had significant amount of EXCESS formation noted along the medial border. This led to excessive necrotic tissue. The necrotic tissue was removed during the surgical procedure   Helane GuntherGregory Whitt Auletta DPM     Helane GuntherGregory Johnnay Pleitez DPM

## 2016-03-07 ENCOUNTER — Ambulatory Visit: Payer: Medicaid Other | Admitting: Podiatry

## 2016-03-08 ENCOUNTER — Encounter: Payer: Self-pay | Admitting: Podiatry

## 2016-03-08 ENCOUNTER — Ambulatory Visit (INDEPENDENT_AMBULATORY_CARE_PROVIDER_SITE_OTHER): Payer: Medicaid Other | Admitting: Podiatry

## 2016-03-08 ENCOUNTER — Ambulatory Visit: Payer: Medicaid Other | Admitting: Podiatry

## 2016-03-08 DIAGNOSIS — Z09 Encounter for follow-up examination after completed treatment for conditions other than malignant neoplasm: Secondary | ICD-10-CM

## 2016-03-08 NOTE — Progress Notes (Signed)
This patient returns to the office following nail surgery one week ago.  The patient says toe has been soaked and bandaged as directed.  There has been improvement of the toe since the surgery has been performed. The patient presents for continued evaluation and treatment.  GENERAL APPEARANCE: Alert, conversant. Appropriately groomed. No acute distress.  VASCULAR: Pedal pulses palpable at  DP and PT bilateral.  Capillary refill time is immediate to all digits,  Normal temperature gradient.    NEUROLOGIC: sensation is normal to 5.07 monofilament at 5/5 sites bilateral.  Light touch is intact bilateral, Muscle strength normal.  MUSCULOSKELETAL: acceptable muscle strength, tone and stability bilateral.  Intrinsic muscluature intact bilateral.  Rectus appearance of foot and digits noted bilateral.   DERMATOLOGIC: skin color, texture, and turgor are within normal limits.  No preulcerative lesions or ulcers  are seen, no interdigital maceration noted.   NAILS  There is necrotic tissue along the nail groove  In the absence of redness swelling and pain.  DX  S/p nail surgery  ROV  Home instructions were discussed.  Patient to call the office if there are any questions or concerns.   Ostin Mathey DPM   

## 2016-04-03 ENCOUNTER — Other Ambulatory Visit: Payer: Self-pay | Admitting: Family Medicine

## 2016-04-03 DIAGNOSIS — I10 Essential (primary) hypertension: Secondary | ICD-10-CM

## 2016-04-05 ENCOUNTER — Other Ambulatory Visit: Payer: Self-pay | Admitting: Family Medicine

## 2016-04-05 DIAGNOSIS — E1122 Type 2 diabetes mellitus with diabetic chronic kidney disease: Secondary | ICD-10-CM

## 2016-04-05 DIAGNOSIS — Z794 Long term (current) use of insulin: Principal | ICD-10-CM

## 2016-04-05 DIAGNOSIS — N185 Chronic kidney disease, stage 5: Principal | ICD-10-CM

## 2016-05-16 ENCOUNTER — Encounter: Payer: Self-pay | Admitting: Family Medicine

## 2016-05-30 ENCOUNTER — Other Ambulatory Visit: Payer: Self-pay | Admitting: Family Medicine

## 2016-06-04 ENCOUNTER — Other Ambulatory Visit: Payer: Self-pay | Admitting: Family Medicine

## 2016-06-04 DIAGNOSIS — I10 Essential (primary) hypertension: Secondary | ICD-10-CM

## 2016-07-24 ENCOUNTER — Other Ambulatory Visit: Payer: Self-pay | Admitting: Family Medicine

## 2016-07-24 DIAGNOSIS — E1122 Type 2 diabetes mellitus with diabetic chronic kidney disease: Secondary | ICD-10-CM

## 2016-07-24 DIAGNOSIS — N185 Chronic kidney disease, stage 5: Principal | ICD-10-CM

## 2016-07-24 DIAGNOSIS — Z794 Long term (current) use of insulin: Principal | ICD-10-CM

## 2016-08-03 ENCOUNTER — Other Ambulatory Visit: Payer: Self-pay | Admitting: Family Medicine

## 2016-08-03 DIAGNOSIS — N185 Chronic kidney disease, stage 5: Principal | ICD-10-CM

## 2016-08-03 DIAGNOSIS — Z794 Long term (current) use of insulin: Principal | ICD-10-CM

## 2016-08-03 DIAGNOSIS — E1122 Type 2 diabetes mellitus with diabetic chronic kidney disease: Secondary | ICD-10-CM

## 2016-08-18 ENCOUNTER — Other Ambulatory Visit: Payer: Self-pay | Admitting: Family Medicine

## 2016-09-20 ENCOUNTER — Other Ambulatory Visit: Payer: Self-pay | Admitting: Internal Medicine

## 2016-09-29 ENCOUNTER — Other Ambulatory Visit: Payer: Self-pay | Admitting: Family Medicine

## 2016-10-04 ENCOUNTER — Other Ambulatory Visit: Payer: Self-pay

## 2016-10-04 ENCOUNTER — Other Ambulatory Visit: Payer: Self-pay | Admitting: Pharmacist

## 2016-10-04 MED ORDER — INSULIN ASPART 100 UNIT/ML ~~LOC~~ SOLN: SUBCUTANEOUS | 0 refills | Status: DC

## 2016-10-04 MED ORDER — INSULIN GLARGINE 100 UNIT/ML ~~LOC~~ SOLN: 15.0000 [IU] | Freq: Every day | SUBCUTANEOUS | 0 refills | Status: DC

## 2016-10-04 NOTE — Telephone Encounter (Signed)
Refilled insulin to last until appt with Dr. Laural Benes

## 2016-10-11 ENCOUNTER — Other Ambulatory Visit: Payer: Self-pay | Admitting: Pharmacist

## 2016-10-11 DIAGNOSIS — Z794 Long term (current) use of insulin: Principal | ICD-10-CM

## 2016-10-11 DIAGNOSIS — N185 Chronic kidney disease, stage 5: Principal | ICD-10-CM

## 2016-10-11 DIAGNOSIS — E1122 Type 2 diabetes mellitus with diabetic chronic kidney disease: Secondary | ICD-10-CM

## 2016-10-11 MED ORDER — GLUCOSE BLOOD VI STRP: ORAL_STRIP | 2 refills | Status: DC

## 2016-10-29 ENCOUNTER — Other Ambulatory Visit: Payer: Self-pay | Admitting: Internal Medicine

## 2016-10-30 ENCOUNTER — Other Ambulatory Visit: Payer: Self-pay | Admitting: Internal Medicine

## 2016-11-13 IMAGING — US US RENAL
1 series · 14 of 25 positions shown · non-contrast
Comparison: None.

CLINICAL DATA: Renal failure.  Hypertension and diabetes.

EXAM:
RENAL / URINARY TRACT ULTRASOUND COMPLETE

[Series 1: us renal · 0.21mm/px · 14 of 46 slices shown]
[im 1/46]
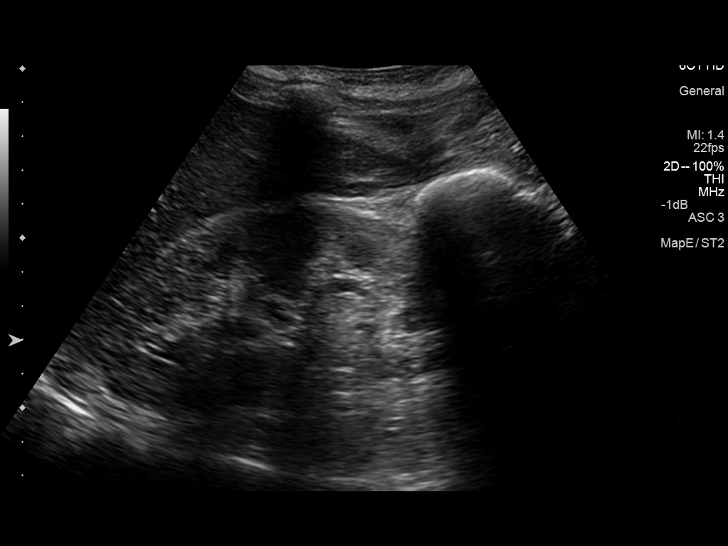
[im 4/46]
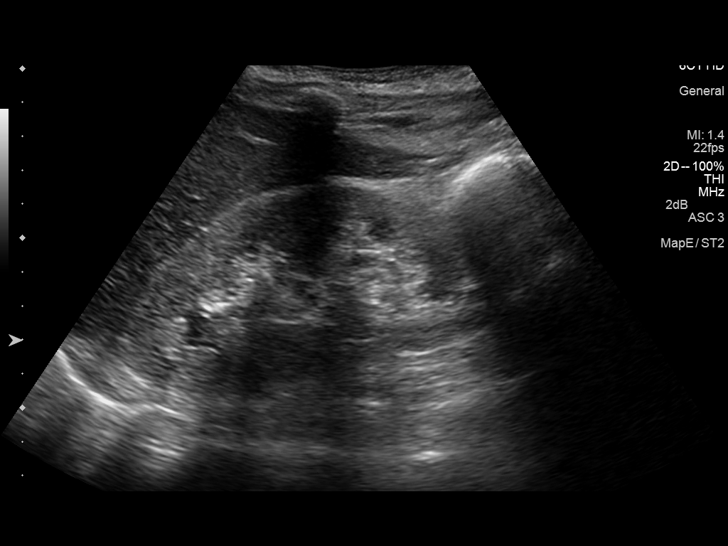
[im 8/46]
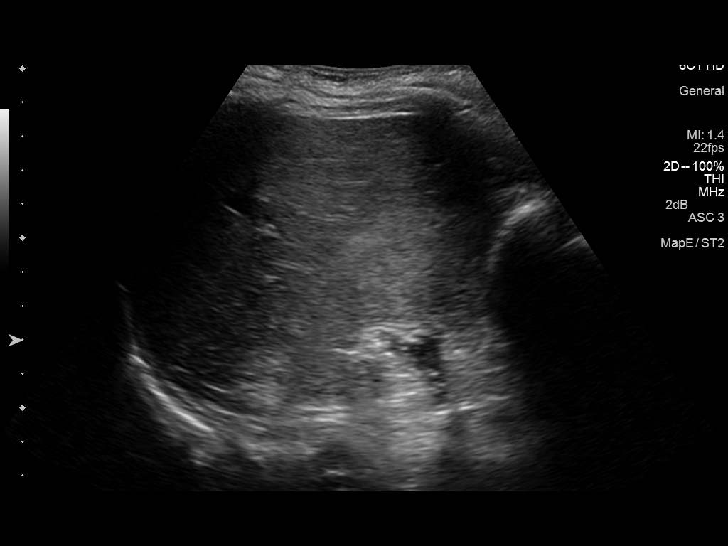
[im 12/46]
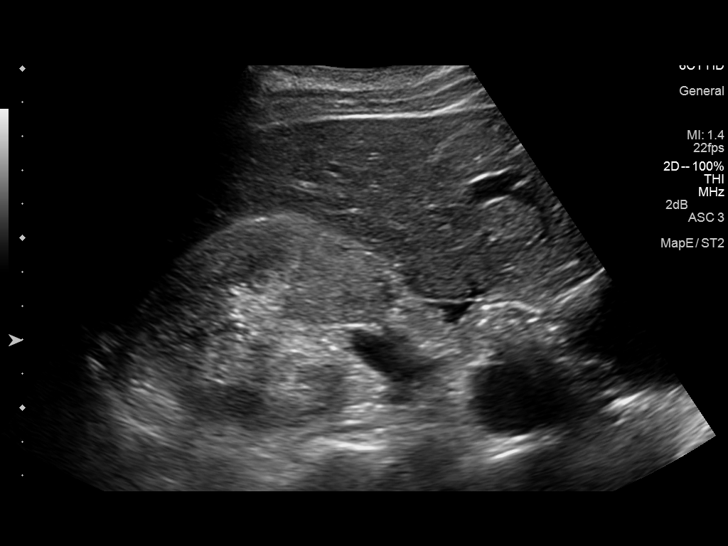
[im 16/46]
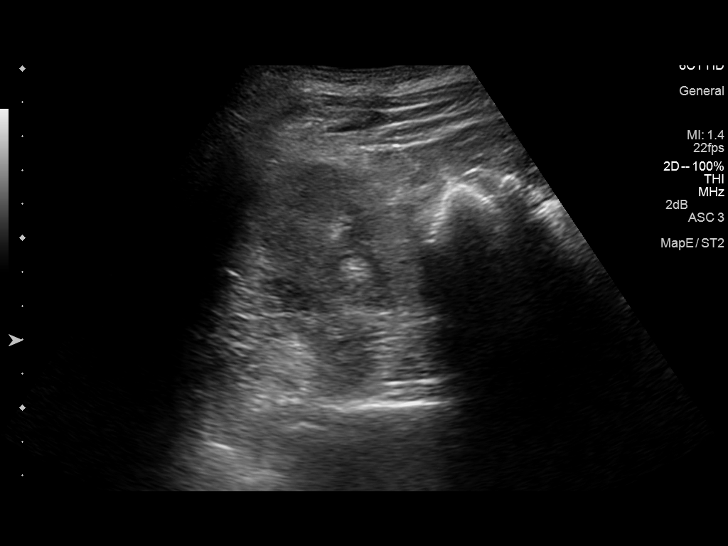
[im 17/46]
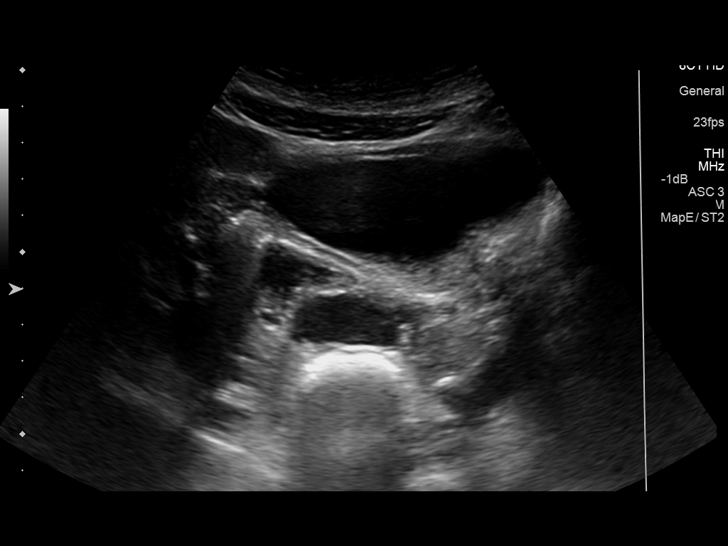
[im 21/46]
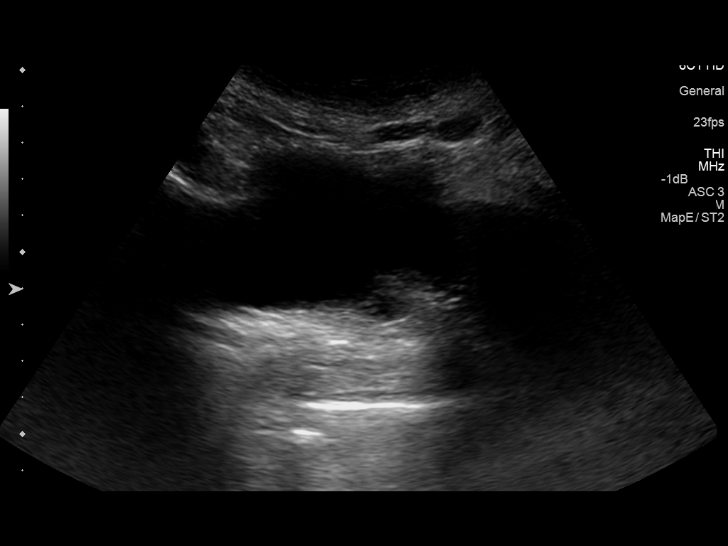
[im 25/46]
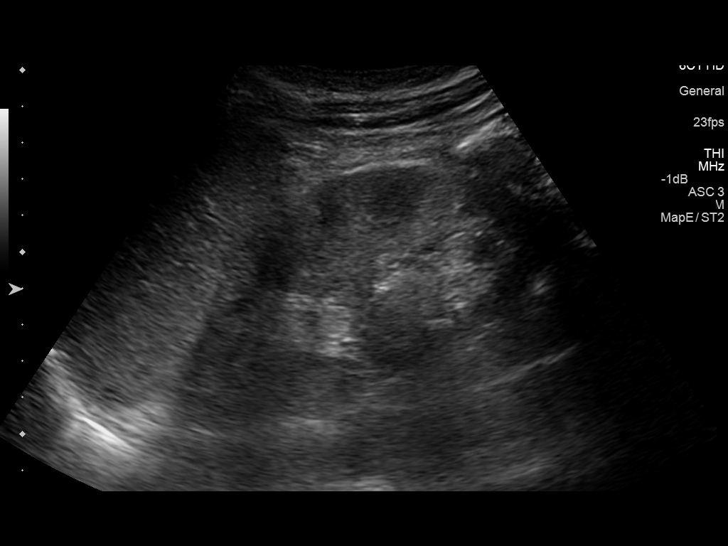
[im 29/46]
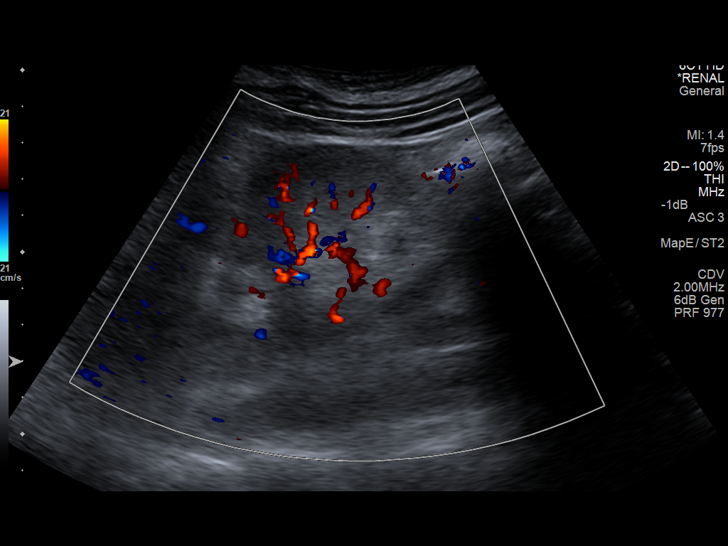
[im 31/46]
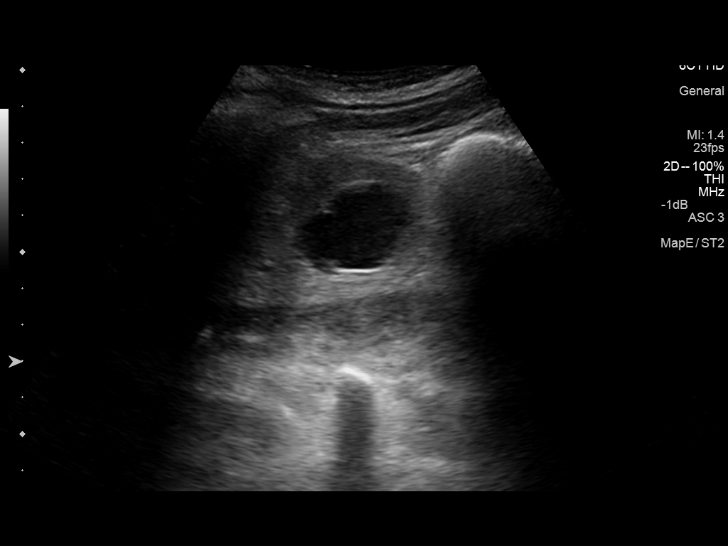
[im 34/46]
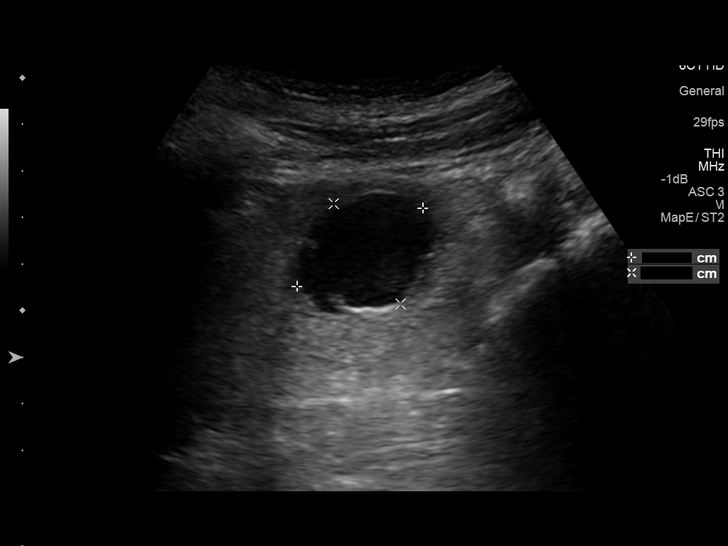
[im 38/46]
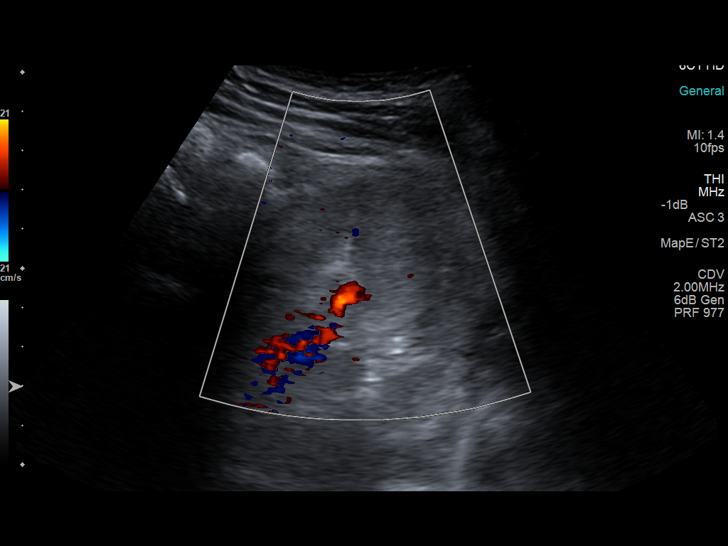
[im 42/46]
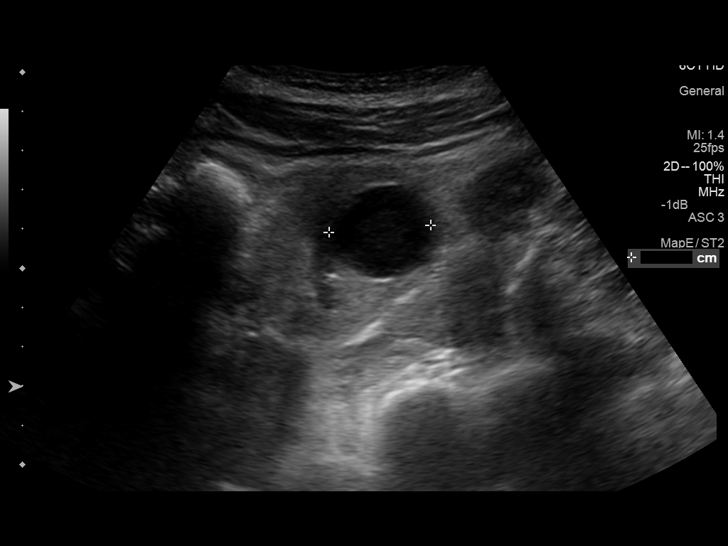
[im 46/46]
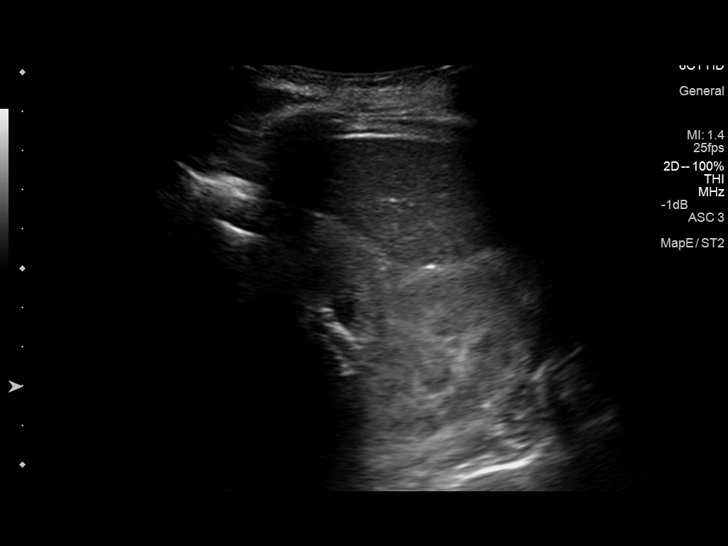

[14 of 25 positions shown; findings below may reference images not displayed]

FINDINGS: Right Kidney:

Length: 11.9 cm.. No mass or hydronephrosis. Cortical echogenicity
is increased.

Left Kidney:

Length: 11.7 cm. No hydronephrosis. Cortical echogenicity is
increased. A cyst off the lower pole measures 3.2 x 2.6 x 2.6 cm.
There appears to be a small amount of debris within the cyst.

Bladder:

Appears normal for degree of bladder distention.
IMPRESSION: Increased cortical echogenicity in both kidneys consistent with
medical renal disease.

## 2016-11-15 ENCOUNTER — Ambulatory Visit: Payer: Self-pay | Attending: Internal Medicine | Admitting: Internal Medicine

## 2016-11-15 ENCOUNTER — Encounter: Payer: Self-pay | Admitting: Internal Medicine

## 2016-11-15 VITALS — BP 118/70 | HR 78 | Temp 98.2°F | Resp 18 | Ht 67.5 in | Wt 164.0 lb

## 2016-11-15 DIAGNOSIS — N186 End stage renal disease: Secondary | ICD-10-CM | POA: Insufficient documentation

## 2016-11-15 DIAGNOSIS — E11649 Type 2 diabetes mellitus with hypoglycemia without coma: Secondary | ICD-10-CM

## 2016-11-15 DIAGNOSIS — E118 Type 2 diabetes mellitus with unspecified complications: Secondary | ICD-10-CM

## 2016-11-15 DIAGNOSIS — E1122 Type 2 diabetes mellitus with diabetic chronic kidney disease: Secondary | ICD-10-CM | POA: Insufficient documentation

## 2016-11-15 DIAGNOSIS — Z2821 Immunization not carried out because of patient refusal: Secondary | ICD-10-CM

## 2016-11-15 DIAGNOSIS — Z794 Long term (current) use of insulin: Secondary | ICD-10-CM | POA: Insufficient documentation

## 2016-11-15 DIAGNOSIS — Z1331 Encounter for screening for depression: Secondary | ICD-10-CM

## 2016-11-15 DIAGNOSIS — I120 Hypertensive chronic kidney disease with stage 5 chronic kidney disease or end stage renal disease: Secondary | ICD-10-CM | POA: Insufficient documentation

## 2016-11-15 DIAGNOSIS — Z87891 Personal history of nicotine dependence: Secondary | ICD-10-CM | POA: Insufficient documentation

## 2016-11-15 DIAGNOSIS — Z992 Dependence on renal dialysis: Secondary | ICD-10-CM | POA: Insufficient documentation

## 2016-11-15 LAB — GLUCOSE, POCT (MANUAL RESULT ENTRY)
POC GLUCOSE: 63 mg/dL — AB (ref 70–99)
POC Glucose: 99 mg/dl (ref 70–99)

## 2016-11-15 MED ORDER — INSULIN ASPART 100 UNIT/ML ~~LOC~~ SOLN: 3.0000 [IU] | Freq: Three times a day (TID) | SUBCUTANEOUS | 11 refills | Status: DC

## 2016-11-15 MED ORDER — INSULIN SYRINGES (DISPOSABLE) U-100 0.3 ML MISC: 3 refills | Status: AC

## 2016-11-15 MED ORDER — INSULIN GLARGINE 100 UNIT/ML ~~LOC~~ SOLN: 15.0000 [IU] | Freq: Every day | SUBCUTANEOUS | 11 refills | Status: DC

## 2016-11-15 NOTE — Progress Notes (Signed)
Patient ate a piece of candy to address the 62 level sugar

## 2016-11-15 NOTE — Progress Notes (Signed)
Patient ID: Crystal Tanner, female    DOB: Feb 18, 1970  MRN: [id number]  CC: Diabetes   Subjective: Crystal Tanner is a 46 y.o. female who presents for chronic disease management.  Last saw Dr. Adrian Blackwater 10/2015 Her concerns today include:  Patient with history of diabetes type 2, HTN, ESRD on hemodialysis and ACD  1. DM: Medications  - taking Lantus 15 units and NovoLog 3 units with meals 3  Checks blood sugar 4-5 x a day.  Gives a range of 140-150 evening in a.ms. Hightest BS 250.  Low BS episodes occasionally.  Not always able to tell when blood sugars are low.  Appetite comes and goes Not exercising due to low energy.  Feels dialysis saps her energy.  2. HTN/ESRD: Reportedly taken off of all meds by her nephrologist Dr. Damita Dunnings No CP/SOB/LE edmea  HM: decline flu. Thinks she had Tdap and Pneumovax in HD  Patient Active Problem List   Diagnosis Date Noted  . Type 2 diabetes with complication ([place]) 67/59/1638  . ESRD on dialysis ([place]) 10/14/2015  . Secondary hyperparathyroidism of renal origin ([place]) 06/27/2015  . Constipation 04/28/2015  . Emesis   . Nausea & vomiting 02/19/2015  . Dysmenorrhea 01/07/2015  . Chronic constipation 10/01/2014  . Current smoker 10/01/2014  . CKD (chronic kidney disease), stage V ([place]) 09/08/2014  . 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 Anemia, chronic renal failure 09/08/2014     Current Outpatient Medications on File Prior to Visit  Medication Sig Dispense Refill  . ACCU-CHEK SOFTCLIX LANCETS lancets USE TO TEST BLOOD SUGAR THREE TIMES DAILY. 300 each 3  . Blood Glucose Monitoring Suppl (ACCU-CHEK AVIVA PLUS) w/Device KIT CHECK BLOOD SUGAR THREE TIMES DAILY AFTER A MEAL 1 kit 0  . Blood Glucose Monitoring Suppl (TRUE METRIX METER) DEVI 1 each by Does not apply route 3 (three) times daily. 1 Device 0  . calcitRIOL (ROCALTROL) 0.25 MCG capsule Take 0.25 mcg by mouth every Monday, Wednesday, and Friday.  5  . cloNIDine (CATAPRES) 0.1 MG tablet  Take 1 tablet (0.1 mg total) by mouth 3 (three) times daily. 90 tablet 0  . diltiazem (CARDIZEM CD) 180 MG 24 hr capsule TAKE 2 CAPSULES BY MOUTH EVERY DAY 180 capsule 2  . docusate sodium (COLACE) 100 MG capsule Take 4 capsules (400 mg total) by mouth daily. 30 capsule 0  . glucose blood (ACCU-CHEK AVIVA PLUS) test strip TEST BLOOD SUGAR 3 TIMES DAILY 300 each 2  . insulin glargine (LANTUS) 100 UNIT/ML injection Inject 0.15 mLs (15 Units total) into the skin at bedtime. 15 mL 0  . multivitamin (RENA-VIT) TABS tablet Take 1 tablet by mouth at bedtime. 30 tablet 3  . NOVOLOG 100 UNIT/ML injection INJECT 5 UNITS UNDER THE SKIN THREE TIMES DAILY WITH MEALS; ADJUST AS NEEDED PER SLIDING SCALE 10 mL 0  . ondansetron (ZOFRAN ODT) 8 MG disintegrating tablet Take 1 tablet (8 mg total) by mouth every 8 (eight) hours as needed for nausea or vomiting. 30 tablet 2  . TRUEPLUS LANCETS 28G MISC 1 each by Does not apply route 3 (three) times daily. 100 each 11  . polyethylene glycol (MIRALAX / GLYCOLAX) packet Take 17 g by mouth daily as needed for moderate constipation. (Patient not taking: Reported on 03/01/2016) 14 each 0   No current facility-administered medications on file prior to visit.     Allergies  Allergen Reactions  . No Known Allergies     Social History   Socioeconomic History  .  Marital status: Single    Spouse name: Not on file  . Number of children: Not on file  . Years of education: Not on file  . Highest education level: Not on file  Social Needs  . Financial resource strain: Not on file  . Food insecurity - worry: Not on file  . Food insecurity - inability: Not on file  . Transportation needs - medical: Not on file  . Transportation needs - non-medical: Not on file  Occupational History  . Not on file  Tobacco Use  . Smoking status: Former Smoker    Packs/day: 0.25    Years: 15.00    Pack years: 3.75    Last attempt to quit: 08/14/2015    Years since quitting: 1.2  .  Smokeless tobacco: Never Used  Substance and Sexual Activity  . Alcohol use: No    Alcohol/week: 0.0 oz    Comment: less than a ppd, "not much"  . Drug use: Yes    Types: Marijuana    Comment: last time August 11  . Sexual activity: No  Other Topics Concern  . Not on file  Social History Narrative  . Not on file    Family History  Problem Relation Age of Onset  . Hypertension Mother   . Diabetes Mother     Past Surgical History:  Procedure Laterality Date  . ABSCESS DRAINAGE     Laparoscopic, abdominal  . ABSCESS DRAINAGE Left    arm  . BASCILIC VEIN TRANSPOSITION Left 06/27/2015   Procedure: FIRST STAGE BASILIC VEIN TRANSPOSITION;  Surgeon: Rosetta Posner, MD;  Location: [facility];  Service: Vascular;  Laterality: Left;  . INSERTION OF DIALYSIS CATHETER Right 08/16/2015   Procedure: INSERTION OF DIALYSIS CATHETER - RIGHT INTERNAL JUGULAR PLACEMENT;  Surgeon: Elam Dutch, MD;  Location: [facility];  Service: Vascular;  Laterality: Right;  . MULTIPLE TOOTH EXTRACTIONS      ROS: Review of Systems  PHYSICAL EXAM: BP 118/70 (BP Location: Left Arm, Patient Position: Sitting, Cuff Size: Normal)   Pulse 78   Temp 98.2 F (36.8 C) (Oral)   Resp 18   Ht 5' 7.5" (1.715 m)   Wt 164 lb (74.4 kg)   SpO2 97%   BMI 25.31 kg/m   Wt Readings from Last 3 Encounters:  11/15/16 164 lb (74.4 kg)  03/01/16 161 lb (73 kg)  11/01/15 161 lb 9.6 oz (73.3 kg)   Physical Exam  General appearance - alert, well appearing, middle-aged African-American female and in no distress Mental status - alert, oriented to person, place, and time.  Flat affect Eyes -pink conjunctiva  mouth - mucous membranes moist, pharynx normal without lesions Chest - clear to auscultation, no wheezes, rales or rhonchi, symmetric air entry Heart - normal rate, regular rhythm, normal S1, S2, no murmurs, rubs, clicks or gallops Extremities -no lower extremity edema  Results for orders placed or performed in visit on  11/15/16  Glucose (CBG)  Result Value Ref Range   POC Glucose 63 (A) 70 - 99 mg/dl   A1C 6.6  Depression screen [facility] 2/9 11/15/2016 11/01/2015 07/12/2015 06/28/2015 04/05/2015  Decreased Interest 2 2 0 3 3  Down, Depressed, Hopeless 0 1 0 2 2  PHQ - 2 Score 2 3 0 5 5  Altered sleeping 1 2 - 0 3  Tired, decreased energy 3 2 - 3 2  Change in appetite 1 2 - 2 2  Feeling bad or failure about yourself  0  0 - 0 2  Trouble concentrating 0 0 - 0 3  Moving slowly or fidgety/restless 0 0 - 0 2  Suicidal thoughts 0 0 - 0 0  PHQ-9 Score 7 9 - 10 19    ASSESSMENT AND PLAN: 1. Type 2 diabetes mellitus with complication, with long-term current use of insulin (HCC) At goal.  Continue Lantus and NovoLog at current doses. Patient declines blood work today. - HgB A1c - Glucose (CBG) - insulin glargine (LANTUS) 100 UNIT/ML injection; Inject 0.15 mLs (15 Units total) at bedtime into the skin.  Dispense: 15 mL; Refill: 11 - insulin aspart (NOVOLOG) 100 UNIT/ML injection; Inject 3 Units 3 (three) times daily with meals into the skin.  Dispense: 10 mL; Refill: 11 - Insulin Syringes, Disposable, U-100 0.3 ML MISC; Use as directed  Dispense: 100 each; Refill: 3 - Glucose (CBG)  2. ESRD on dialysis [facility]) Followed by nephrology  3. 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  She did feel the blood sugar was low. patient ate a few pieces of hard candy that she had with her.  Repeat blood sugar was in the 90s -Since episodes occur only occasionally and usually associated with not eating, we will not make any changes in insulin at this time  Advised patient to avoid skipping meals.  She can drink her Glucerna shake when she does not feel like eating a full meal. 4. Influenza vaccination declined   5. Positive depression screening Will address on follow-up visit  I have asked patient to get documentation from her hemodialysis center if she  thinks she had Tdap and Pneumovax there so that I can update her record Patient was given the opportunity to ask questions.  Patient verbalized understanding of the plan and was able to repeat key elements of the plan.   Orders Placed This Encounter  Procedures  . HgB A1c  . Glucose (CBG)     Requested Prescriptions    No prescriptions requested or ordered in this encounter    Follow-up in 3 weeks for breast exam and Pap smear Karle Plumber, MD, Rosalita Chessman

## 2016-11-20 LAB — POCT GLYCOSYLATED HEMOGLOBIN (HGB A1C): HEMOGLOBIN A1C: 6.6

## 2016-11-25 ENCOUNTER — Other Ambulatory Visit: Payer: Self-pay | Admitting: Internal Medicine

## 2016-12-26 ENCOUNTER — Ambulatory Visit: Payer: Self-pay | Admitting: Podiatry

## 2017-01-17 ENCOUNTER — Other Ambulatory Visit: Payer: Self-pay | Admitting: Pharmacist

## 2017-01-17 ENCOUNTER — Telehealth: Payer: Self-pay | Admitting: Internal Medicine

## 2017-01-17 ENCOUNTER — Encounter: Payer: Self-pay | Admitting: Pharmacist

## 2017-01-17 DIAGNOSIS — Z794 Long term (current) use of insulin: Principal | ICD-10-CM

## 2017-01-17 DIAGNOSIS — E118 Type 2 diabetes mellitus with unspecified complications: Secondary | ICD-10-CM

## 2017-01-17 MED ORDER — INSULIN GLARGINE 100 UNIT/ML ~~LOC~~ SOLN: 15.0000 [IU] | Freq: Every day | SUBCUTANEOUS | 0 refills | Status: DC

## 2017-01-17 MED ORDER — INSULIN ASPART 100 UNIT/ML ~~LOC~~ SOLN: 3.0000 [IU] | Freq: Three times a day (TID) | SUBCUTANEOUS | 0 refills | Status: DC

## 2017-01-17 NOTE — Telephone Encounter (Signed)
Pt came in to request advice on cheaper insulin, she says she has medicare and would like cheaper options

## 2017-01-17 NOTE — Progress Notes (Signed)
Patient came in to speak about her insulin. She reports that she cannot afford it and would like something cheaper.  Patient reports that she has Medicare and her part D plan is through [facility].  She does have a deductible of $450. Therefore to get any insulin, she will have to meet this deductible.  She could get Relion NPH and R but that would not help her meet her deductible because that would have to be out of pocket.  She called [employer] and they encouraged her to call the drug companies for assistance. Will forward prescriptions to our pharmacy to see if they can assist with the application. Patient may need referral to [facility] if she truly does have [employer] and if she qualifies.

## 2017-01-18 ENCOUNTER — Telehealth: Payer: Self-pay | Admitting: Pharmacist

## 2017-01-18 ENCOUNTER — Other Ambulatory Visit: Payer: Self-pay | Admitting: Internal Medicine

## 2017-01-18 MED ORDER — INSULIN GLARGINE 100 UNIT/ML SOLOSTAR PEN: 15.0000 [IU] | PEN_INJECTOR | Freq: Every day | SUBCUTANEOUS | 4 refills | Status: DC

## 2017-01-18 MED FILL — LANTUS SOLOSTAR 100 UNITS/M: 100 | 20 days supply | Qty: 3 | Fill #0

## 2017-01-18 NOTE — Telephone Encounter (Addendum)
Received call from Dr. Hyman HopesJegede last night that patient was waiting on me to help her with the PASS paperwork in the waiting room after I had left. He got her number and left me a note to call her back.  Called patient back and apologized for the confusion. She needs to see the pharmacy to do the PASS paperwork, I cannot assist her. I was able to see that her Lantus is much cheaper here than at [facility] (here is is $72) but that she can speak to the pharmacy about getting on the PASS program and to see if they have any samples of insulin.  Explained that the PASS program can take weeks to get her medication if she does qualify so she will need to get samples or pay out of pocket in the mean time.  Otherwise, if she is ineligible for PASS and there are no samples, she will likely need to switch to insulin NPH and R.  Patient verbalized understanding and will go to the pharmacy today after dialysis.

## 2017-01-25 ENCOUNTER — Other Ambulatory Visit: Payer: Self-pay | Admitting: Internal Medicine

## 2017-01-25 MED ORDER — INSULIN ASPART 100 UNIT/ML FLEXPEN: 3.0000 [IU] | PEN_INJECTOR | Freq: Three times a day (TID) | SUBCUTANEOUS | 11 refills | Status: DC

## 2017-01-25 MED FILL — NOVOLOG FLEXPEN SYRINGE: 100 | 28 days supply | Qty: 3 | Fill #0

## 2017-02-12 ENCOUNTER — Telehealth: Payer: Self-pay | Admitting: Internal Medicine

## 2017-02-12 MED FILL — LANTUS SOLOSTAR 100 UNITS/M: 100 | 20 days supply | Qty: 3 | Fill #1

## 2017-02-12 NOTE — Telephone Encounter (Signed)
Pt called, need to talk to the pcp since she is out of her insulin since she is taking more that what she was prescribe , she need to know why was give her less insulin now, please follow up

## 2017-02-13 MED ORDER — INSULIN ASPART 100 UNIT/ML FLEXPEN: 3.0000 [IU] | PEN_INJECTOR | Freq: Three times a day (TID) | SUBCUTANEOUS | 2 refills | Status: DC

## 2017-02-13 MED FILL — NOVOLOG FLEXPEN SYRINGE: 100 | 33 days supply | Qty: 6 | Fill #0

## 2017-02-13 NOTE — Telephone Encounter (Signed)
Checked previous notes and pharmacy records and we do not have any record of the dose being higher than prescribed. I called the patient to clarify but she did not answer. Left HIPAA-compliant message requesting that she call me back.  Of note, per the pharmacy records, her Lantus is ready to pick up but the Novolog is too soon - she should have 9 days left of that.

## 2017-02-13 NOTE — Addendum Note (Signed)
Addended by: Santa LighterHAMMER, Kaston Faughn K on: 02/13/2017 08:59 AM   Modules accepted: Orders

## 2017-02-13 NOTE — Telephone Encounter (Addendum)
Patient returned my call - she is taking 3 units with meals but will sometimes have snacks or other instances were her blood sugar will increased during the day and she has to take insulin for that. She has had cases where she has had to take at most 6 units with a meal if she is eating a very large meal or if her blood sugar is already very high prior to eating a meal. However, she typically just needs an extra dose of 3 units Novolog during the day. I have sent in the prescription to reflect the 3-6 units of Novolog so that the pharmacy will fill it now. Patient denies hypoglycemia with the higher dose and verbalizes understanding that she needs to let us know when she feels like she needs more insulin rather than changing the dose on her own.

## 2017-03-07 MED FILL — LANTUS SOLOSTAR 100 UNITS/M: 100 | 20 days supply | Qty: 3 | Fill #2

## 2017-03-21 MED FILL — NOVOLOG FLEXPEN SYRINGE: 100 | 33 days supply | Qty: 6 | Fill #1

## 2017-03-28 MED FILL — LANTUS SOLOSTAR 100 UNITS/M: 100 | 20 days supply | Qty: 3 | Fill #3

## 2017-04-16 ENCOUNTER — Other Ambulatory Visit: Payer: Self-pay | Admitting: *Deleted

## 2017-04-16 MED ORDER — GLUCOSE BLOOD VI STRP: ORAL_STRIP | 12 refills | Status: AC

## 2017-04-16 MED FILL — LANTUS SOLOSTAR 100 UNITS/M: 100 | 20 days supply | Qty: 3 | Fill #4

## 2017-04-18 ENCOUNTER — Ambulatory Visit: Payer: Self-pay | Attending: Internal Medicine | Admitting: Internal Medicine

## 2017-04-18 ENCOUNTER — Encounter: Payer: Self-pay | Admitting: Internal Medicine

## 2017-04-18 VITALS — BP 123/69 | HR 69 | Temp 98.6°F | Resp 16 | Wt 162.6 lb

## 2017-04-18 DIAGNOSIS — Z992 Dependence on renal dialysis: Secondary | ICD-10-CM | POA: Insufficient documentation

## 2017-04-18 DIAGNOSIS — F1290 Cannabis use, unspecified, uncomplicated: Secondary | ICD-10-CM | POA: Insufficient documentation

## 2017-04-18 DIAGNOSIS — I120 Hypertensive chronic kidney disease with stage 5 chronic kidney disease or end stage renal disease: Secondary | ICD-10-CM | POA: Insufficient documentation

## 2017-04-18 DIAGNOSIS — Z794 Long term (current) use of insulin: Secondary | ICD-10-CM | POA: Insufficient documentation

## 2017-04-18 DIAGNOSIS — Z87891 Personal history of nicotine dependence: Secondary | ICD-10-CM | POA: Insufficient documentation

## 2017-04-18 DIAGNOSIS — N186 End stage renal disease: Secondary | ICD-10-CM | POA: Insufficient documentation

## 2017-04-18 DIAGNOSIS — Z79899 Other long term (current) drug therapy: Secondary | ICD-10-CM | POA: Insufficient documentation

## 2017-04-18 DIAGNOSIS — E1165 Type 2 diabetes mellitus with hyperglycemia: Secondary | ICD-10-CM

## 2017-04-18 DIAGNOSIS — K219 Gastro-esophageal reflux disease without esophagitis: Secondary | ICD-10-CM | POA: Insufficient documentation

## 2017-04-18 DIAGNOSIS — E1122 Type 2 diabetes mellitus with diabetic chronic kidney disease: Secondary | ICD-10-CM | POA: Insufficient documentation

## 2017-04-18 DIAGNOSIS — IMO0002 Reserved for concepts with insufficient information to code with codable children: Secondary | ICD-10-CM

## 2017-04-18 LAB — GLUCOSE, POCT (MANUAL RESULT ENTRY): POC Glucose: 202 mg/dl — AB (ref 70–99)

## 2017-04-18 MED ORDER — INSULIN ASPART 100 UNIT/ML FLEXPEN: 10.0000 [IU] | PEN_INJECTOR | Freq: Three times a day (TID) | SUBCUTANEOUS | 5 refills | Status: AC

## 2017-04-18 MED ORDER — INSULIN GLARGINE 100 UNIT/ML SOLOSTAR PEN: PEN_INJECTOR | SUBCUTANEOUS | 5 refills | Status: AC

## 2017-04-18 MED ORDER — INSULIN PEN NEEDLE 31G X 8 MM MISC: 6 refills | Status: AC

## 2017-04-18 NOTE — Progress Notes (Signed)
Patient ID: Crystal Tanner, female    DOB: 05-26-1970  MRN: [id number]  CC: Diabetes and Medication Management   Subjective: Crystal Tanner is a 47 y.o. female who presents for chronic ds management. Her concerns today include:  Patient with history of diabetes type 2, HTN, ESRD on hemodialysis, and ACD   1.  DM:  BS running high.  Checking BS 4-5 x a day.  Range 300-500.  Sometimes on HD she feels tachy; checks BS and usually high.  On Novolog - 5 units with meals and a SS, Lantus 15 units in the evenings.  Wants to go back to using NovoLog and Lantus vials rather than the pens.  She thinks that her blood sugars were better when she was drawing insulin up in the syringe from the vials compared to using the insulin pens.  We discussed techniques of injection with the pens and it sounds like she is doing it correctly.  She reports patient only noticing some residual insulin on her skin when she withdraws the needle.   Eating habits:  Drinks diet ginger ale, and Ice tea and Lemonade with splenda.  Usually eats 1 meal a day. Small appetite -Due for eye exam.  2.  Former cig smoker.  Smokes Marijuana daily.  "It helps me deal with dialysis and everything I have going on better."   3.  HTN: controlled off meds.  Pt goes to HD 3 xa wk  Patient Active Problem List   Diagnosis Date Noted  . Type 2 diabetes with complication ([place]) 96/75/9163  . ESRD on dialysis ([place]) 10/14/2015  . Secondary hyperparathyroidism of renal origin ([place]) 06/27/2015  . Dysmenorrhea 01/07/2015  . Chronic constipation 10/01/2014  . Current smoker 10/01/2014  . 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 Anemia, chronic renal failure 09/08/2014     Current Outpatient Medications on File Prior to Visit  Medication Sig Dispense Refill  . ACCU-CHEK SOFTCLIX LANCETS lancets USE TO TEST BLOOD SUGAR THREE TIMES DAILY. 300 each 3  . Blood Glucose Monitoring Suppl (ACCU-CHEK AVIVA PLUS) w/Device KIT CHECK BLOOD SUGAR THREE  TIMES DAILY AFTER A MEAL 1 kit 0  . Blood Glucose Monitoring Suppl (TRUE METRIX METER) DEVI 1 each by Does not apply route 3 (three) times daily. 1 Device 0  . calcitRIOL (ROCALTROL) 0.25 MCG capsule Take 0.25 mcg by mouth every Monday, Wednesday, and Friday.  5  . glucose blood test strip Use as instructed 100 each 12  . Insulin Syringes, Disposable, U-100 0.3 ML MISC Use as directed 100 each 3  . multivitamin (RENA-VIT) TABS tablet Take 1 tablet by mouth at bedtime. 30 tablet 3  . TRUEPLUS LANCETS 28G MISC 1 each by Does not apply route 3 (three) times daily. 100 each 11   No current facility-administered medications on file prior to visit.     Allergies  Allergen Reactions  . No Known Allergies     Social History   Socioeconomic History  . Marital status: Single    Spouse name: Not on file  . Number of children: Not on file  . Years of education: Not on file  . Highest education level: Not on file  Occupational History  . Not on file  Social Needs  . Financial resource strain: Not on file  . Food insecurity:    Worry: Not on file    Inability: Not on file  . Transportation needs:    Medical: Not on file    Non-medical: Not on file  Tobacco Use  . Smoking status: Former Smoker    Packs/day: 0.25    Years: 15.00    Pack years: 3.75    Last attempt to quit: 08/14/2015    Years since quitting: 1.6  . Smokeless tobacco: Never Used  Substance and Sexual Activity  . Alcohol use: No    Alcohol/week: 0.0 oz    Comment: less than a ppd, "not much"  . Drug use: Yes    Types: Marijuana    Comment: last time August 11  . Sexual activity: Never  Lifestyle  . Physical activity:    Days per week: Not on file    Minutes per session: Not on file  . Stress: Not on file  Relationships  . Social connections:    Talks on phone: Not on file    Gets together: Not on file    Attends religious service: Not on file    Active member of club or organization: Not on file    Attends  meetings of clubs or organizations: Not on file    Relationship status: Not on file  . Intimate partner violence:    Fear of current or ex partner: Not on file    Emotionally abused: Not on file    Physically abused: Not on file    Forced sexual activity: Not on file  Other Topics Concern  . Not on file  Social History Narrative  . Not on file    Family History  Problem Relation Age of Onset  . Hypertension Mother   . Diabetes Mother     Past Surgical History:  Procedure Laterality Date  . ABSCESS DRAINAGE     Laparoscopic, abdominal  . ABSCESS DRAINAGE Left    arm  . BASCILIC VEIN TRANSPOSITION Left 06/27/2015   Procedure: FIRST STAGE BASILIC VEIN TRANSPOSITION;  Surgeon: Rosetta Posner, MD;  Location: [facility];  Service: Vascular;  Laterality: Left;  . INSERTION OF DIALYSIS CATHETER Right 08/16/2015   Procedure: INSERTION OF DIALYSIS CATHETER - RIGHT INTERNAL JUGULAR PLACEMENT;  Surgeon: Elam Dutch, MD;  Location: [facility];  Service: Vascular;  Laterality: Right;  . MULTIPLE TOOTH EXTRACTIONS      ROS: Review of Systems Complains of constantly feeling like she has to burp with nausea.  She does not feel it is GERD.  Her nephrologist placed on omeprazole which she is taking twice a day.  However symptoms have not improved.  She denies any weight changes.    PHYSICAL EXAM: BP 123/69   Pulse 69   Temp 98.6 F (37 C) (Oral)   Resp 16   Wt 162 lb 9.6 oz (73.8 kg)   SpO2 98%   BMI 25.09 kg/m   Wt Readings from Last 3 Encounters:  04/18/17 162 lb 9.6 oz (73.8 kg)  11/15/16 164 lb (74.4 kg)  03/01/16 161 lb (73 kg)   Physical Exam  General appearance - alert, well appearing, middle age AAF and in no distress Mental status - alert, oriented to person, place, and time, normal mood, behavior, speech, dress, motor activity, and thought processes Neck - supple, no significant adenopathy Chest - clear to auscultation, no wheezes, rales or rhonchi, symmetric air entry Heart -  normal rate, regular rhythm, normal S1, S2, no murmurs, rubs, clicks or gallops Extremities - no LE edema  Results for orders placed or performed in visit on 04/18/17  POCT glucose (manual entry)  Result Value Ref Range   POC Glucose 202 (A) 70 -  99 mg/dl   A1C today 7.7  ASSESSMENT AND PLAN: 1. Uncontrolled type 2 diabetes mellitus with chronic kidney disease on chronic dialysis, with long-term current use of insulin (HCC) I do not think using Lantus in the pen form would be any different from using the vial in terms of her blood sugars unless injection technique is not correct.  She confirms proper injection techniques.  I did speak with the pharmacist and it was determined that the insulin pens are cheaper through her insurance than the vials.  Patient agreeable to staying on the vials.  Lantus increased from 15 units to 20 units daily.  I also discussed with her in detail of how to increase Lantus by 3 units every 2-3 days until morning blood sugars are consistently less than 130 for maximum of 30 units a day. -NovoLog increased from 5 units with meals to 8 units with meals.  I wrote the prescription however for 10 units with meals so that she has room to increase to 10 units if needed.  Patient expressed understanding of this. Healthy eating habits discussed. Patient plans to join the gym the first of next month.  I advised her to start low on go slow - POCT glucose (manual entry) - Ambulatory referral to Ophthalmology  2. Marijuana use Discouraged use.  3. Gastroesophageal reflux disease without esophagitis gerd precautions discussed - Ambulatory referral to Gastroenterology  Patient was given the opportunity to ask questions.  Patient verbalized understanding of the plan and was able to repeat key elements of the plan.   Orders Placed This Encounter  Procedures  . Ambulatory referral to Gastroenterology  . Ambulatory referral to Ophthalmology  . POCT glucose (manual entry)      Requested Prescriptions   Signed Prescriptions Disp Refills  . Insulin Glargine (LANTUS SOLOSTAR) 100 UNIT/ML Solostar Pen 5 pen 5    Sig: 20 units subcut at bedtime.  Increase by 3 units every 2-3 days until morning blood sugars are consistently less than 150.  Max of 30 units daily.  . insulin aspart (NOVOLOG FLEXPEN) 100 UNIT/ML FlexPen 15 mL 5    Sig: Inject 10 Units into the skin 3 (three) times daily with meals.  . Insulin Pen Needle 31G X 8 MM MISC 100 each 6    Sig: Use as directed    Return in about 2 months (around 06/18/2017).  Karle Plumber, MD, FACP

## 2017-04-18 NOTE — Patient Instructions (Addendum)
Incrase Lantus to 20 units daily.  Increase Lantus insulin by 3 units every 2-3 days until your morning blood sugars are consistently less than 150.   Increase Novolog to 8 units three times a day with meal.

## 2017-05-07 MED FILL — !LANTUS SOLOSTAR 100UNITS/M: 100 | 30 days supply | Qty: 9 | Fill #0

## 2017-06-01 DEATH — deceased

## 2017-08-26 IMAGING — CR DG ABDOMEN ACUTE W/ 1V CHEST
3 series · 3 of 3 positions shown · non-contrast
Comparison: 12/18/2014

CLINICAL DATA: N/V 2 days; hx chronic kidney disease; HTN; SOB due
to N/V;

EXAM:
DG ABDOMEN ACUTE W/ 1V CHEST

[w chest pa]
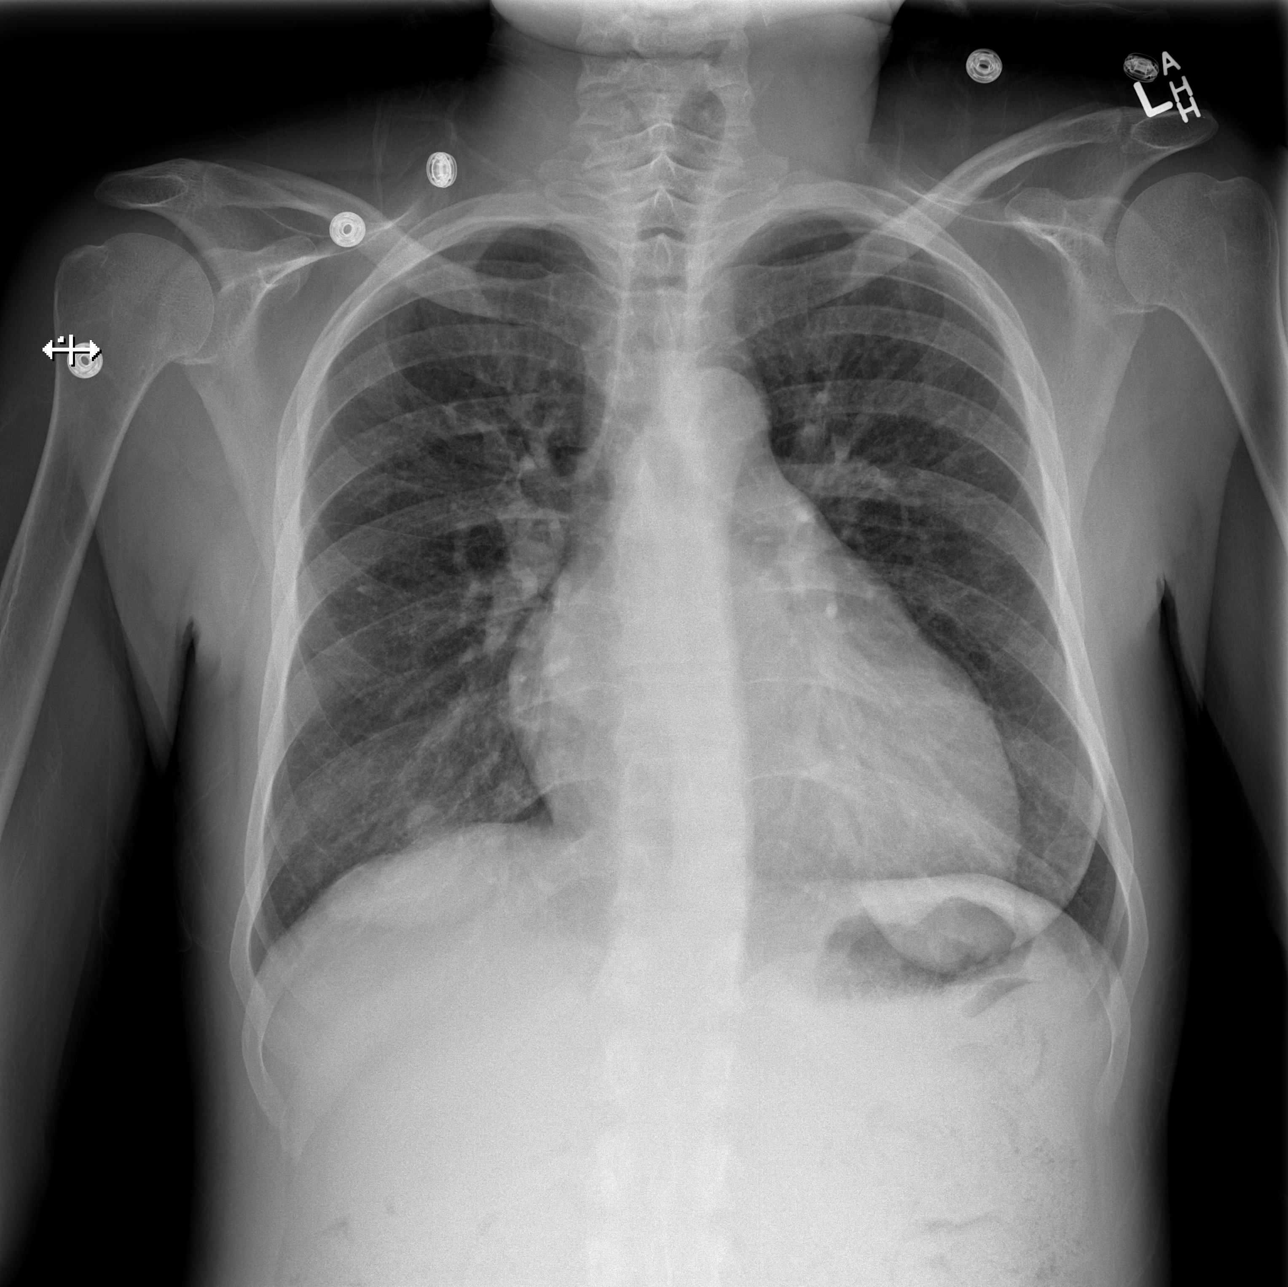

[w abdomen upright]
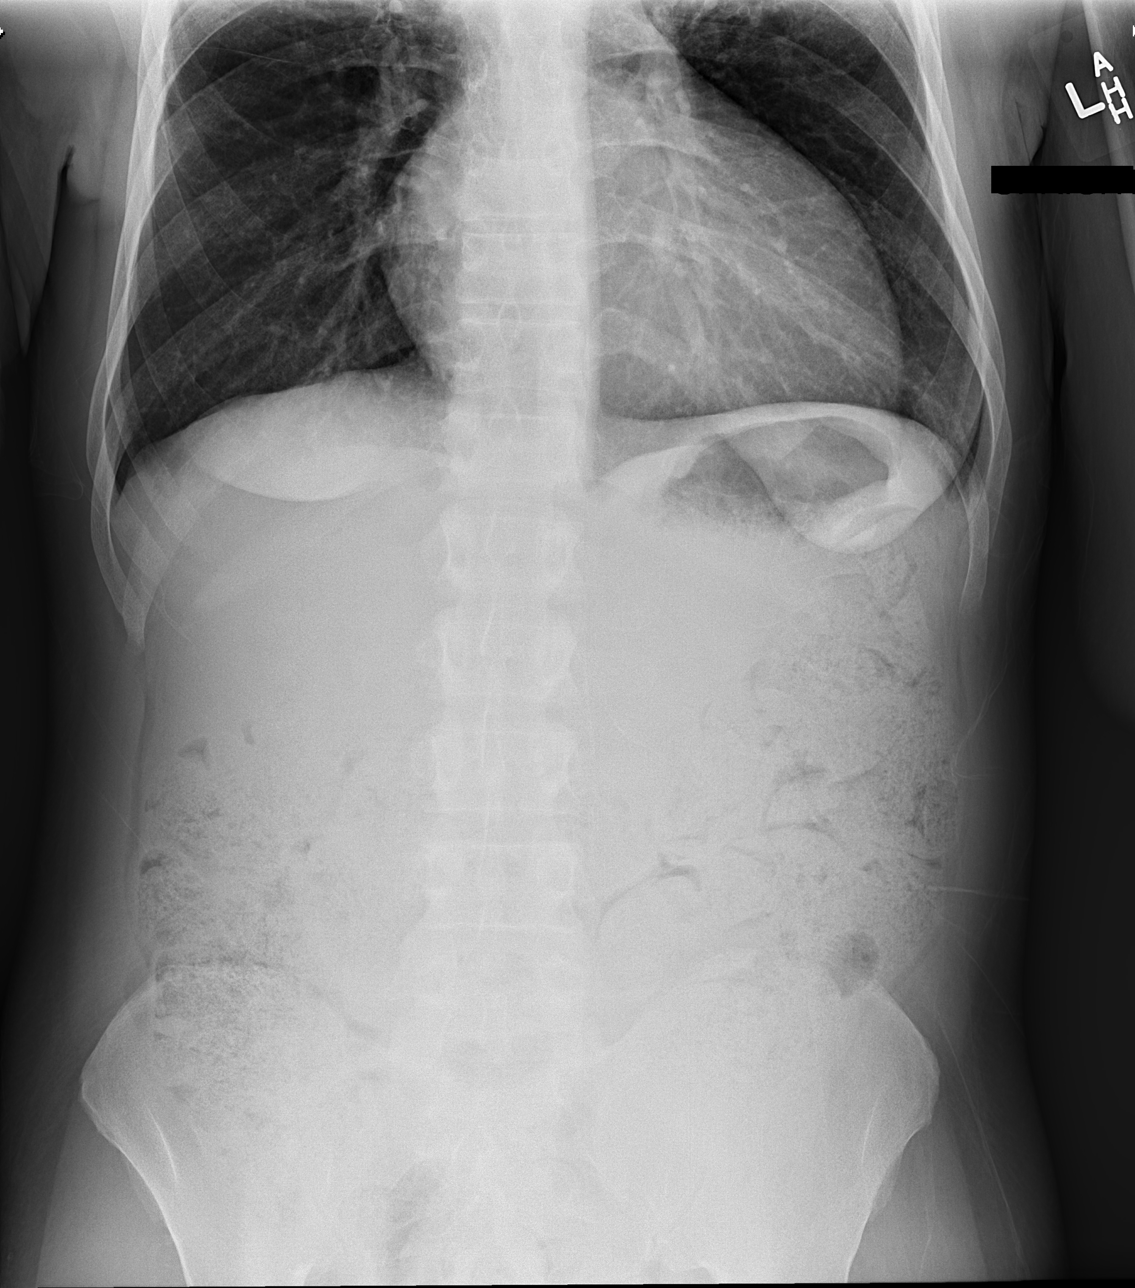

[t abdomen supine]
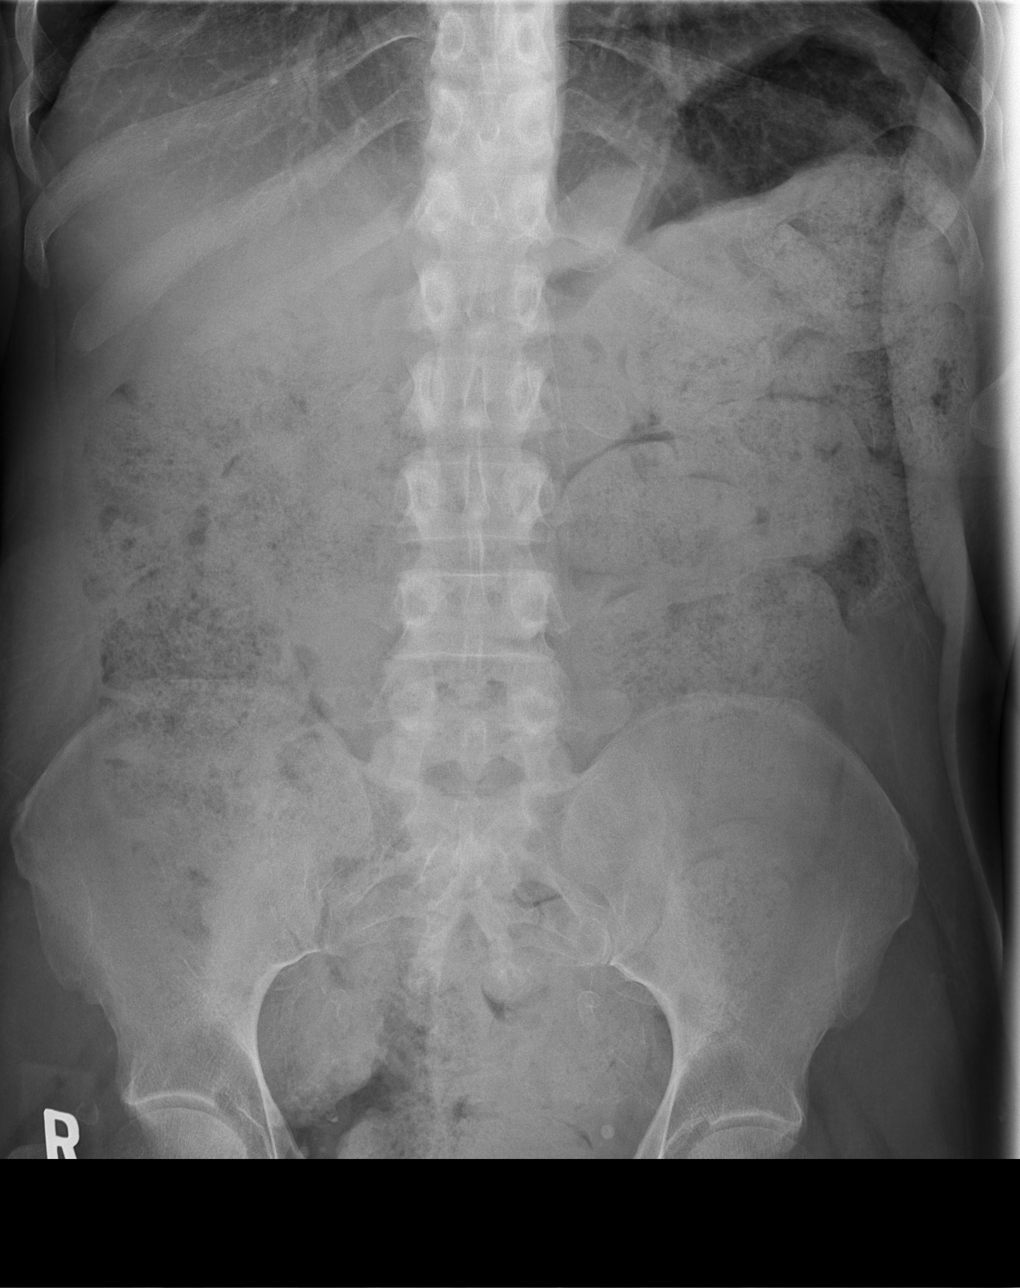

[3 of 3 positions shown; findings below may reference images not displayed]

FINDINGS: There is marked increased stool throughout the colon and in the
rectum somewhat increased in extent when compared to the prior
study. No evidence of bowel obstruction. No free air.

Soft tissues are unremarkable.

Cardiac silhouette is mildly enlarged. No mediastinal or hilar
masses or evidence of adenopathy. Clear lungs.
IMPRESSION: 1. No acute findings.  No evidence of bowel obstruction or free air.
2. Marked increased stool throughout the colon and in the rectum.
3. No acute cardiopulmonary disease.

## 2018-04-08 IMAGING — CR DG ABDOMEN 2V
2 series · 2 of 2 positions shown · non-contrast
Comparison: Abdominal radiograph 06/20/2015.

CLINICAL DATA: 45-year-old female with history of nausea and
vomiting this morning.

EXAM:
ABDOMEN - 2 VIEW

[w abdomen upright]
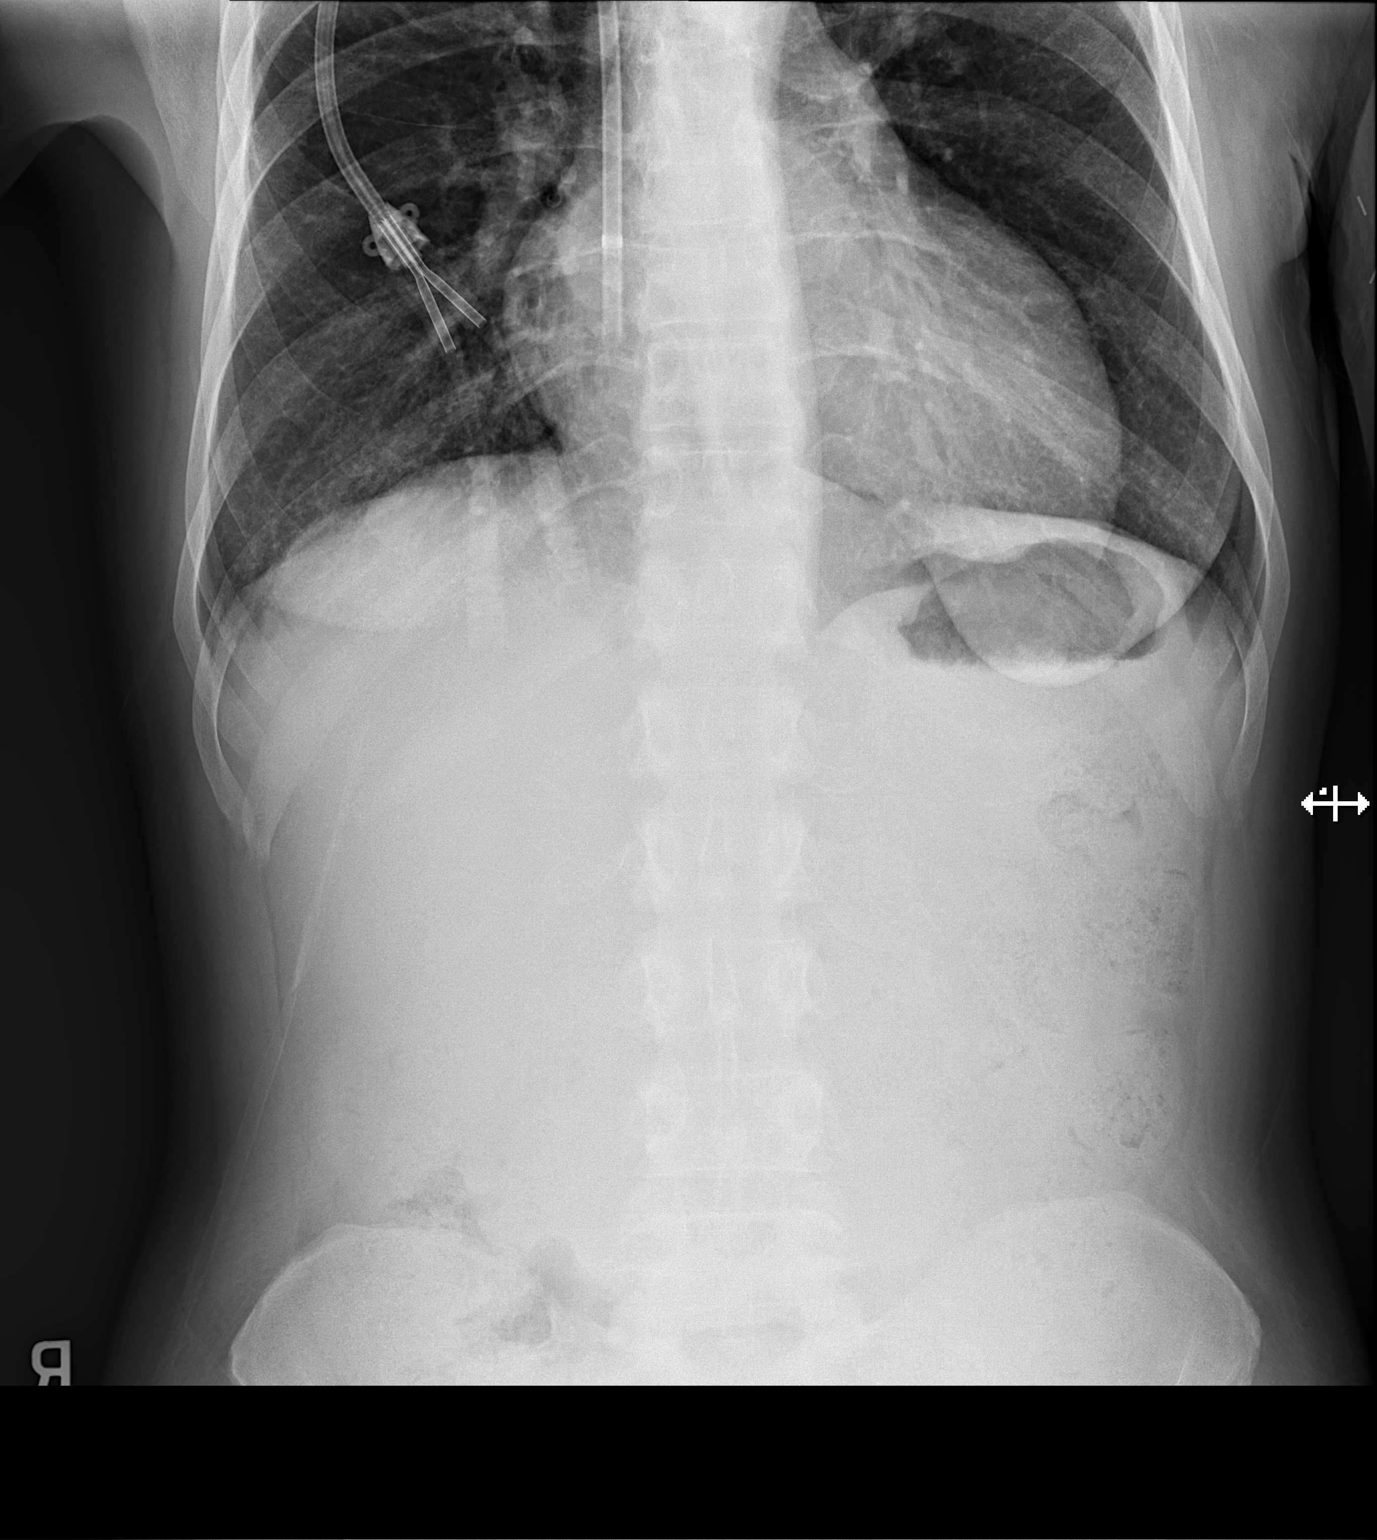

[t abdomen supine]
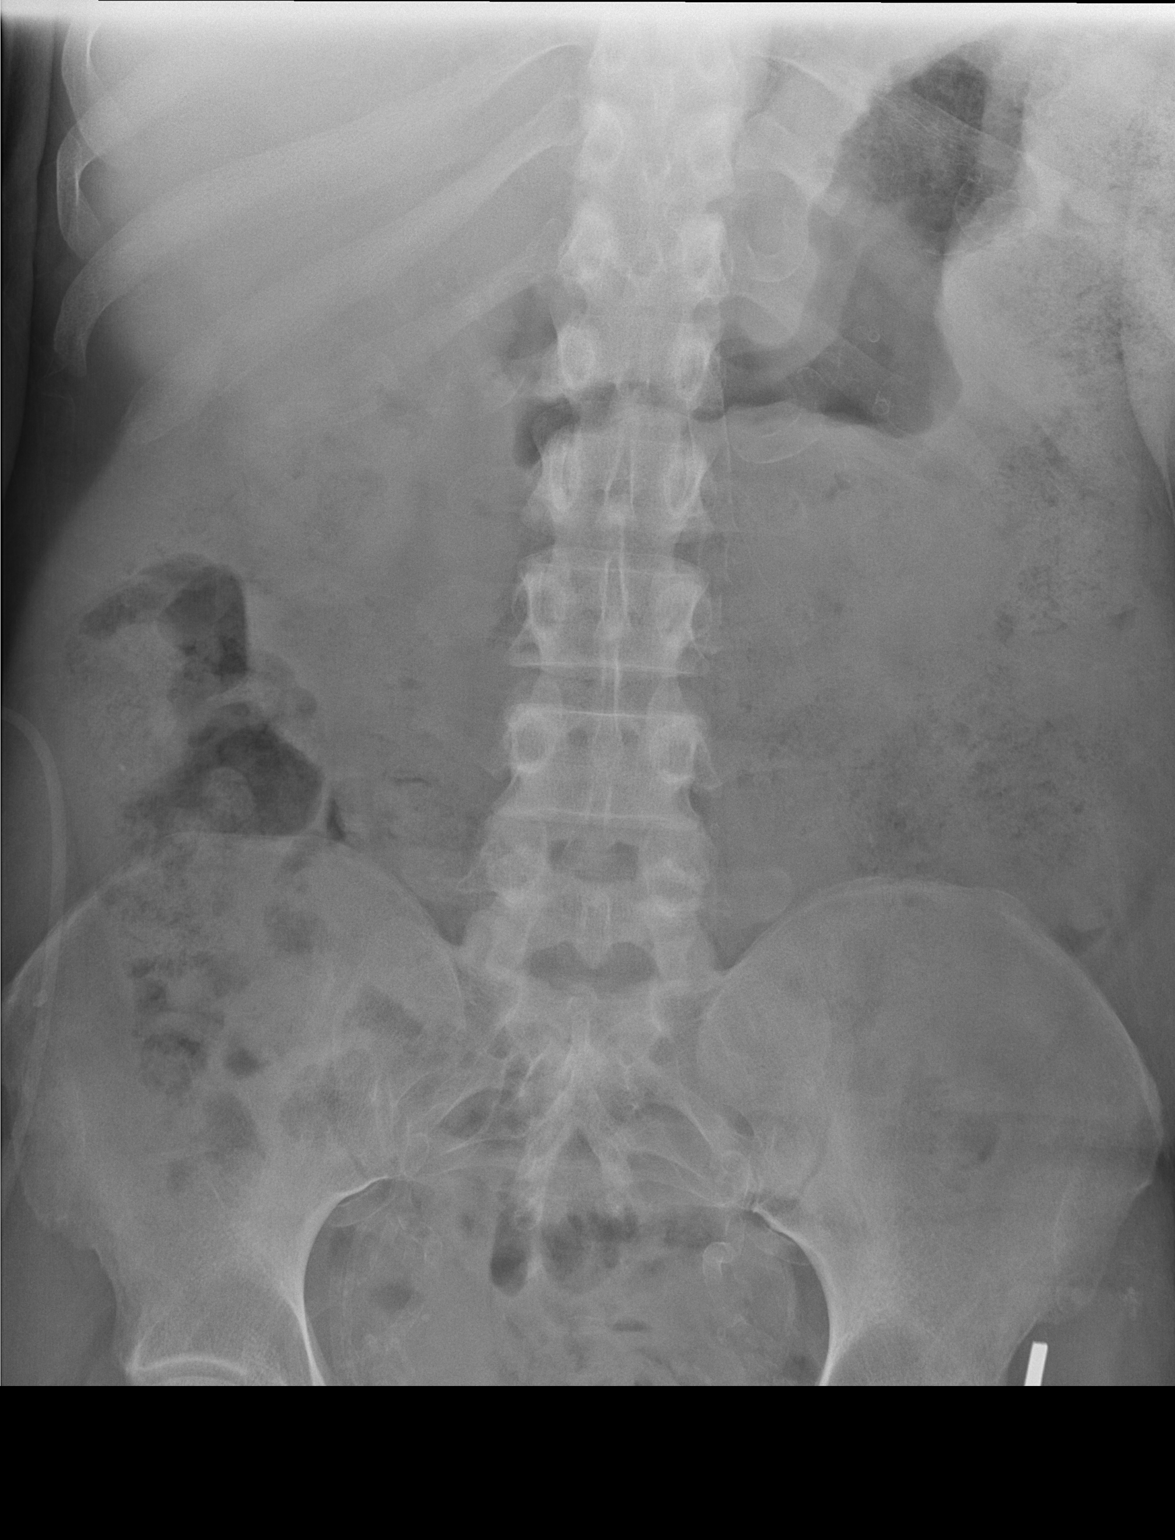

[2 of 2 positions shown; findings below may reference images not displayed]

FINDINGS: Gas and stool are seen scattered throughout the colon extending to
the level of the distal rectum. No pathologic distension of small
bowel is noted. No gross evidence of pneumoperitoneum. Numerous
vascular calcifications of the visualized vasculature, including the
abdominal aorta.
IMPRESSION: 1.  Nonobstructive bowel gas pattern.
2. No pneumoperitoneum.
3. Aortic atherosclerosis.
